# Patient Record
Sex: Male | Born: 1950
Health system: Southern US, Community
[De-identification: ages and names within clinical notes are randomized; demographics above are authoritative.]

## PROBLEM LIST (undated history)

## (undated) DIAGNOSIS — E119 Type 2 diabetes mellitus without complications: Secondary | ICD-10-CM

## (undated) DIAGNOSIS — K219 Gastro-esophageal reflux disease without esophagitis: Secondary | ICD-10-CM

## (undated) DIAGNOSIS — E785 Hyperlipidemia, unspecified: Secondary | ICD-10-CM

## (undated) DIAGNOSIS — T7840XA Allergy, unspecified, initial encounter: Secondary | ICD-10-CM

## (undated) DIAGNOSIS — M1990 Unspecified osteoarthritis, unspecified site: Secondary | ICD-10-CM

## (undated) DIAGNOSIS — K5790 Diverticulosis of intestine, part unspecified, without perforation or abscess without bleeding: Secondary | ICD-10-CM

## (undated) HISTORY — DX: Gastro-esophageal reflux disease without esophagitis: K21.9

## (undated) HISTORY — DX: Unspecified osteoarthritis, unspecified site: M19.90

## (undated) HISTORY — PX: VASECTOMY: SHX75

## (undated) HISTORY — DX: Hyperlipidemia, unspecified: E78.5

## (undated) HISTORY — DX: Diverticulosis of intestine, part unspecified, without perforation or abscess without bleeding: K57.90

## (undated) HISTORY — DX: Allergy, unspecified, initial encounter: T78.40XA

---

## 2006-12-14 ENCOUNTER — Ambulatory Visit: Payer: Self-pay | Admitting: General Practice

## 2007-04-07 ENCOUNTER — Ambulatory Visit: Payer: Self-pay | Admitting: General Practice

## 2009-05-21 ENCOUNTER — Ambulatory Visit: Payer: Self-pay | Admitting: Gastroenterology

## 2011-08-18 ENCOUNTER — Ambulatory Visit: Payer: Self-pay | Admitting: General Practice

## 2011-08-18 LAB — COMPREHENSIVE METABOLIC PANEL
Alkaline Phosphatase: 73 U/L (ref 50–136)
Calcium, Total: 8.7 mg/dL (ref 8.5–10.1)
Co2: 27 mmol/L (ref 21–32)
EGFR (Non-African Amer.): >60
Osmolality: 287 (ref 275–301)
SGOT(AST): 17 U/L (ref 15–37)
SGPT (ALT): 38 U/L
Total Protein: 7.7 g/dL (ref 6.4–8.2)

## 2011-08-18 LAB — CBC WITH DIFFERENTIAL/PLATELET
Basophil %: 0.5 %
Eosinophil #: 0.2 10*3/uL (ref 0.0–0.7)
HCT: 43.7 % (ref 40.0–52.0)
HGB: 14.8 g/dL (ref 13.0–18.0)
Lymphocyte %: 15.8 %
Monocyte %: 7.9 %
Neutrophil #: 8.7 10*3/uL — ABNORMAL HIGH (ref 1.4–6.5)
RBC: 4.76 10*6/uL (ref 4.40–5.90)

## 2011-08-18 LAB — LIPID PANEL
Cholesterol: 183 mg/dL (ref 0–200)
HDL Cholesterol: 42 mg/dL (ref 40–60)
Ldl Cholesterol, Calc: 113 mg/dL — ABNORMAL HIGH (ref 0–100)
Triglycerides: 139 mg/dL (ref 0–200)
VLDL Cholesterol, Calc: 28 mg/dL (ref 5–40)

## 2011-08-18 LAB — LIPASE, BLOOD: Lipase: 155 U/L (ref 73–393)

## 2012-02-19 ENCOUNTER — Ambulatory Visit: Payer: Self-pay | Admitting: General Practice

## 2013-08-19 ENCOUNTER — Ambulatory Visit: Payer: Self-pay | Admitting: General Practice

## 2015-01-17 HISTORY — PX: BACK SURGERY: SHX140

## 2015-02-04 IMAGING — CT CT CHEST W/O CM
2 of 3 series · 15 of 36 positions shown, 18 images · non-contrast
Comparison: Chest CT 02/19/2012, CT abdomen pelvis 08/18/2011

CLINICAL DATA: Follow Up Middle Lobe Nodule

EXAM:
CT CHEST WITHOUT CONTRAST
TECHNIQUE: Multidetector CT imaging of the chest was performed following the
standard protocol without IV contrast..

[Series 2: routine chest wo · axial · 0.80mm/px · z∈[-678,-408]mm · 12 of 64 slices shown, 15 images]
[im 5/64  mediastinal]
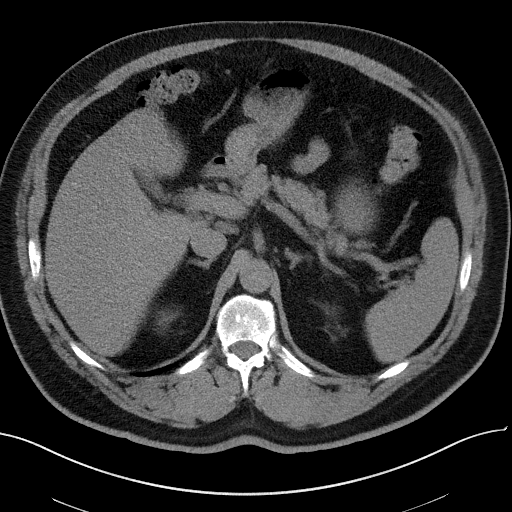
[im 5/64  lung]
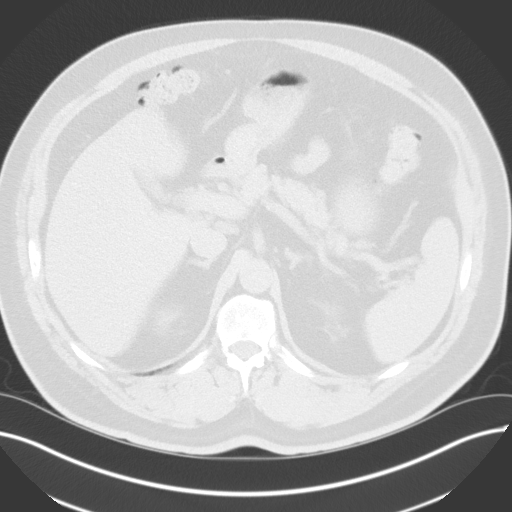
[im 10/64  lung]
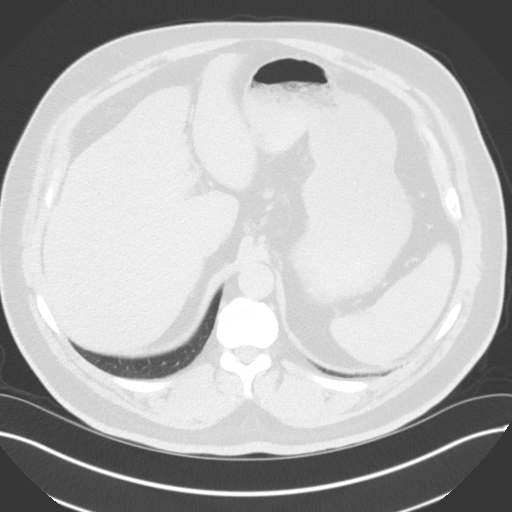
[im 15/64  lung]
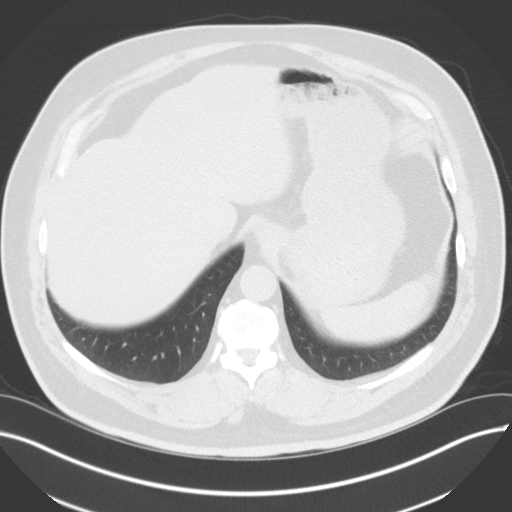
[im 19/64  lung]
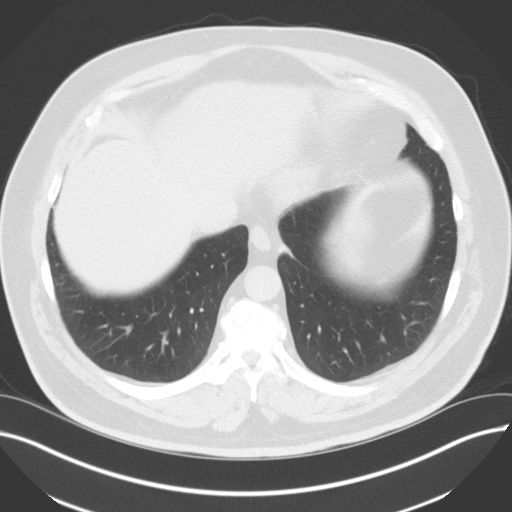
[im 24/64  mediastinal]
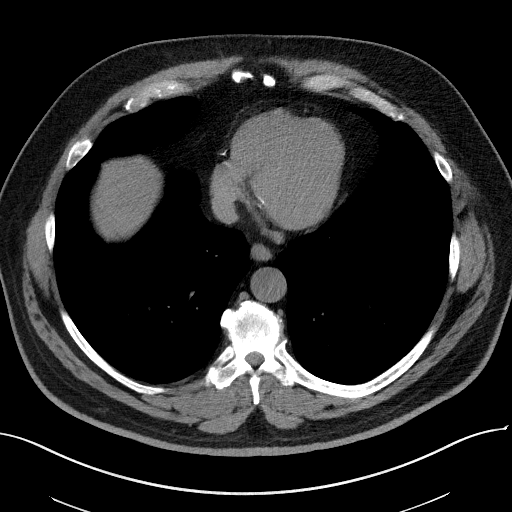
[im 24/64  lung]
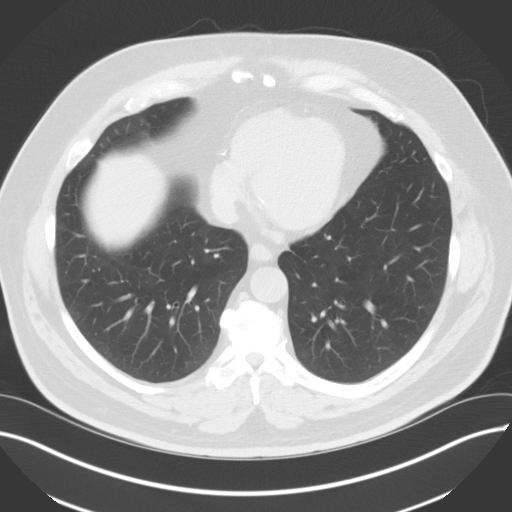
[im 29/64  lung]
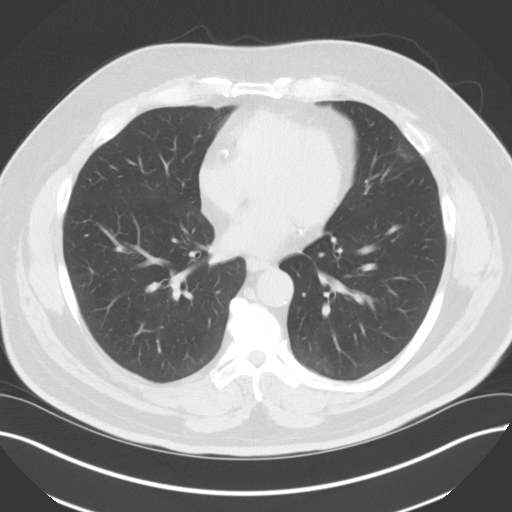
[im 36/64  lung]
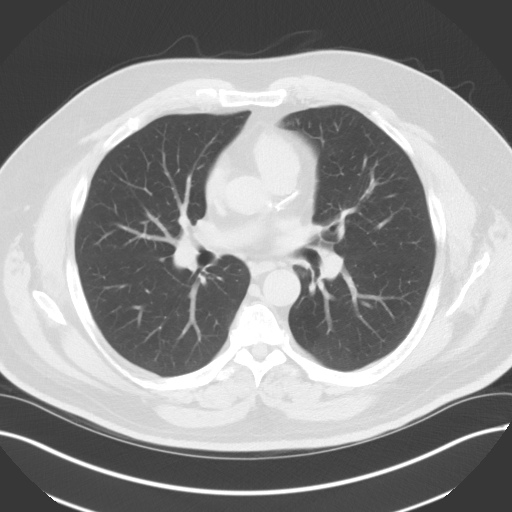
[im 40/64  lung]
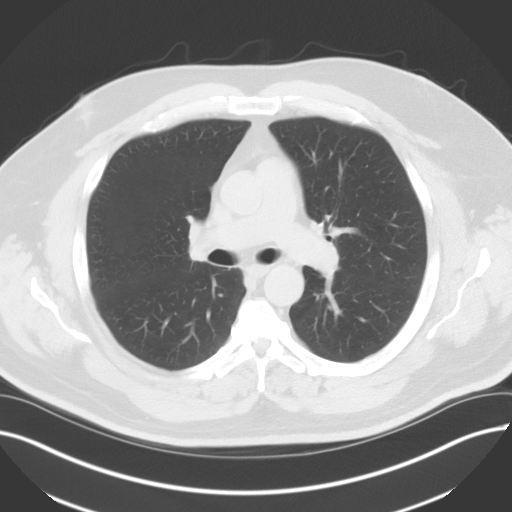
[im 45/64  mediastinal]
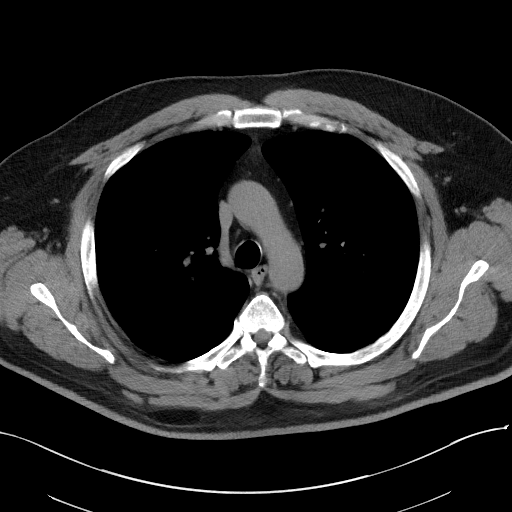
[im 45/64  lung]
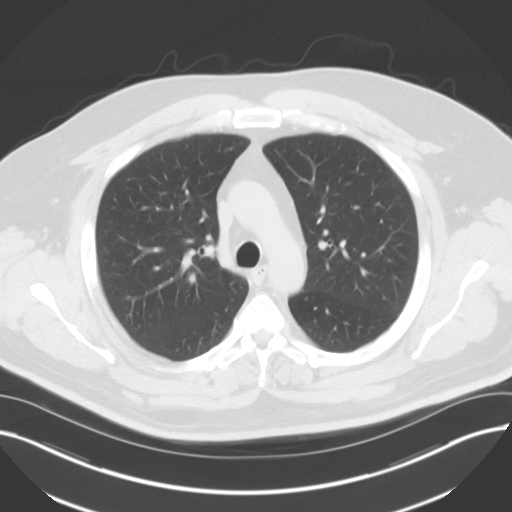
[im 50/64  lung]
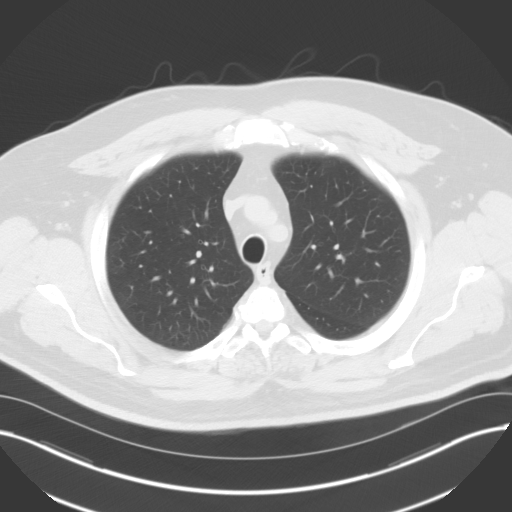
[im 54/64  lung]
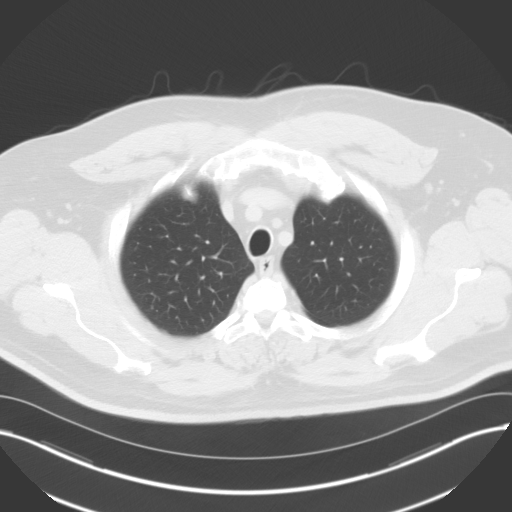
[im 59/64  lung]
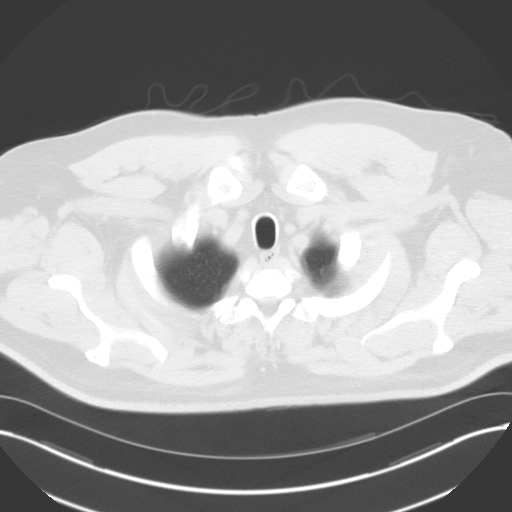

[Series 5: cor routine chest wo · coronal · 0.64mm/px · 3 of 185 slices shown]
[im 37/185  lung]
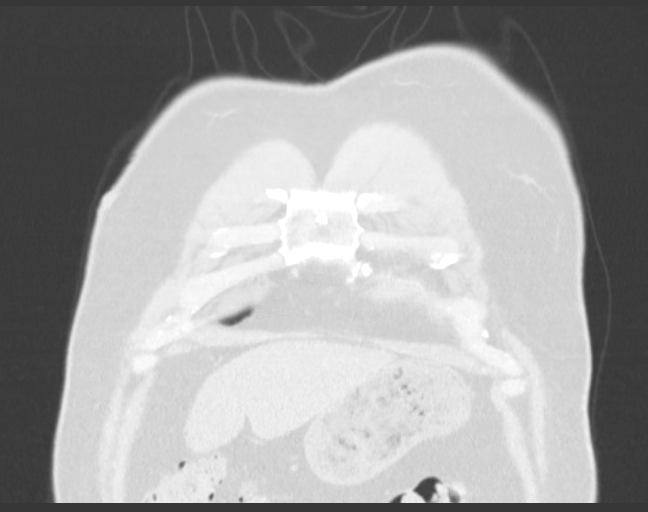
[im 74/185  lung]
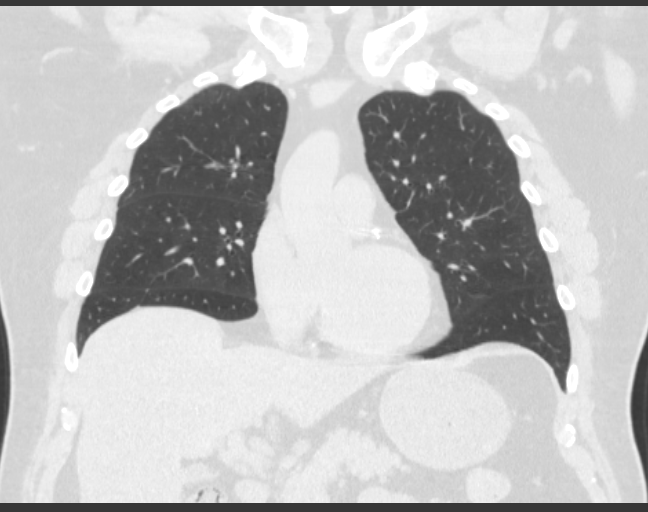
[im 111/185  lung]
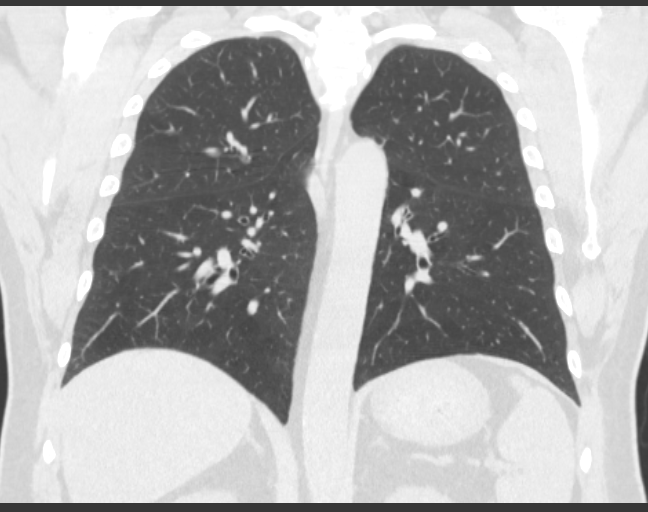

[15 of 36 positions shown; findings below may reference images not displayed]

FINDINGS: Noncontrast evaluation of the thoracic inlets unremarkable.

No mediastinal or hilar adenopathy nor masses. Atherosclerotic
calcifications within the coronary vessels. No thoracic aortic
aneurysm, thoracic aorta contains mild atherosclerotic
calcifications.

Stable 5 mm pulmonary nodule right middle lobe. The lungs otherwise
clear.

The central airways are patent.

Noncontrast evaluation visualized upper abdominal viscera
unremarkable.

No aggressive appearing osseous lesions. Multilevel degenerative
disc disease changes.
IMPRESSION: Stable 5 mm pulmonary nodule right middle lobe. If the patient is at
low risk for neoplastic disease (minimal or absent history of
smoking and other risk factors) no further surveillance evaluation
required considering stability of 12 months from initial study. If
the patient is a high risk patient (history of smoking and of other
risk factors) surveillance evaluation in 18-24 months is
recommended. This recommendation follows the consensus statement:
Guidelines for Management of Small Pulmonary Nodules Detected on CT
Scans: A Statement from the [HOSPITAL] as published in

## 2016-03-31 ENCOUNTER — Telehealth: Payer: Self-pay | Admitting: Family Medicine

## 2016-03-31 NOTE — Telephone Encounter (Signed)
This was a city of [place]  His wife, Blanch Media, is a patient here and he wants to become one of yours.  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    The only medicine he is on is Simvastatin.    Will you take him as a new patient, as his doctor is retiring (I think that's what he said).

## 2016-08-22 ENCOUNTER — Encounter: Payer: Self-pay | Admitting: Family Medicine

## 2016-08-22 ENCOUNTER — Ambulatory Visit (INDEPENDENT_AMBULATORY_CARE_PROVIDER_SITE_OTHER): Payer: Medicare Other | Admitting: Family Medicine

## 2016-08-22 VITALS — BP 128/76 | HR 67 | Temp 98.3°F | Resp 16 | Ht 71.0 in | Wt 250.0 lb

## 2016-08-22 DIAGNOSIS — N138 Other obstructive and reflux uropathy: Secondary | ICD-10-CM | POA: Diagnosis not present

## 2016-08-22 DIAGNOSIS — R7303 Prediabetes: Secondary | ICD-10-CM | POA: Insufficient documentation

## 2016-08-22 DIAGNOSIS — M159 Polyosteoarthritis, unspecified: Secondary | ICD-10-CM | POA: Insufficient documentation

## 2016-08-22 DIAGNOSIS — J3089 Other allergic rhinitis: Secondary | ICD-10-CM | POA: Insufficient documentation

## 2016-08-22 DIAGNOSIS — E782 Mixed hyperlipidemia: Secondary | ICD-10-CM | POA: Diagnosis not present

## 2016-08-22 DIAGNOSIS — M150 Primary generalized (osteo)arthritis: Secondary | ICD-10-CM

## 2016-08-22 DIAGNOSIS — Z7689 Persons encountering health services in other specified circumstances: Secondary | ICD-10-CM

## 2016-08-22 DIAGNOSIS — K5790 Diverticulosis of intestine, part unspecified, without perforation or abscess without bleeding: Secondary | ICD-10-CM | POA: Insufficient documentation

## 2016-08-22 DIAGNOSIS — Z8669 Personal history of other diseases of the nervous system and sense organs: Secondary | ICD-10-CM

## 2016-08-22 DIAGNOSIS — M47816 Spondylosis without myelopathy or radiculopathy, lumbar region: Secondary | ICD-10-CM

## 2016-08-22 DIAGNOSIS — K5730 Diverticulosis of large intestine without perforation or abscess without bleeding: Secondary | ICD-10-CM

## 2016-08-22 DIAGNOSIS — N401 Enlarged prostate with lower urinary tract symptoms: Secondary | ICD-10-CM | POA: Diagnosis not present

## 2016-08-22 DIAGNOSIS — E785 Hyperlipidemia, unspecified: Secondary | ICD-10-CM | POA: Insufficient documentation

## 2016-08-22 NOTE — Assessment & Plan Note (Signed)
Very mild stable BPH LUTS mostly some dribbling post void, otherwise no signifiacnt problems. - AUA BPH score 6 (mild),  - Failed Flomax due to side effect, back pain - Last PSA prior stable PSA around 2.0, without abnormal - Last DRE 11/2015 reported mild enlarged - Fam history of prostate CA, father age 70s  Plan: 1. Reassurance, continue surveillance. Hold meds for now - consider other options if needed, will resume PSA screening DRE in future

## 2016-08-22 NOTE — Assessment & Plan Note (Signed)
Stable, without flare See Back Pain

## 2016-08-22 NOTE — Assessment & Plan Note (Signed)
Stable chronic problem identified on screening colonoscopy Rare flare in past. No hospitalization or GI bleeding. Controlled on diet

## 2016-08-22 NOTE — Assessment & Plan Note (Signed)
Chronic osteoarthritis multiple joints, intermittent flares, currently none. Primary joints include knees, hands, back, R shoulder. Followed by Dr Lara Mulch [facility], [facility]), Dr Amedeo Plenty [facility] Hand), Dr Harl Bowie (Spine, [facility]) Recommend continue conservative therapy Tylenol, NSAID, RICE, may need future shoulder/knee injections in future, consider knee replacement per ortho as advised if worsening.

## 2016-08-22 NOTE — Assessment & Plan Note (Signed)
Stable, controlled on Loratadine, Flonase

## 2016-08-22 NOTE — Assessment & Plan Note (Signed)
Controlled cholesterol on statin and lifestyle, by report, no available outside lab results at this time Last lipid panel 11/2015, next due 11/2016 Did not calculate ASCVD 10 yr risk score today, awaiting new lipid results Significant family history of heart disease (MI, CAD, CABG)  Plan: 1. Continue Simvastatin 20mg  daily for lipids and risk reduction - Discussion today about may need higher potency statin in future other statin or higher dose simva 2. Continue ASA 81mg  for primary ASCVD risk reduction 3. Encourage improved lifestyle - low carb/cholesterol, reduce portion size, continue improving regular exercise 4. Follow-up 6 months - review outside lipids in 11/2016, then consider future Advanced Lipids with inflammation Cardiac IQ testing

## 2016-08-22 NOTE — Assessment & Plan Note (Signed)
Well-controlled Pre-DM with A1c last 5-6 range by report, no available result today  Plan:  1. Not on any therapy currently. Never on med 2. Encourage improved lifestyle - low carb, low sugar diet, reduce portion size, continue improving regular exercise 3. Follow-up 6 months - Pre-DM A1c, await outside lab results

## 2016-08-22 NOTE — Progress Notes (Signed)
Subjective:    Patient ID: Vincent Guerra, male    DOB: 1950/11/27, 66 y.o.   MRN: [id number]  Vincent Guerra is a 66 y.o. male presenting on 08/22/2016 for [place] today with new PCP here at [facility]. Now that he is on Medicare, he can no longer follow-up with [place] physician after age 43. He will go to see them once more in October 2018 for physical.  HPI   No acute concerns today.  Seasonal Allergies: - Mostly year round has mild allergies. Improved on Loratadine and Flonase  Diverticulosis: - Identified on prior colonoscopy for screening. Has had one flare previously, not hospitalized, has since resolved, no further episodes. - No significant changes to diet, but tries to avoid seeds, nuts and other provoking factors  Osteoarthritis (multiple joints, knees, hands, shoulder, back) / Chronic Back Pain - Chronic history of arthritis wear and tear of multiple joints, old injuries and chronic labor and exercise, contributing to this. He describes concerns with specifically his low back, prior complex history with multiple DJD and herniated discs and nerve impingement, had back surgery in 2017 by Dr Harl Bowie [facility]) x 4 diskectomy, has done well since, as long as he does not overdo it. - Other joint problems include bilateral hands, R shoulder, and bilateral knees. He continues to follow with various orthopedic specialists including Dr Ronnie Derby (knee/shoulder) and Dr Amedeo Plenty (hands) has had various cortisone injections in hands, shoulder, and knees. He was advised may need knee replacement in future. He tries to delay this - Takes Tylenol and ibuprofen PRN  HYPERLIPIDEMIA: - Reports no concerns. Last lipid panel 11/2015, controlled has an upcoming physical with labs - Currently taking Simvastatin 75m, tolerating well without side effects or myalgias Lifestyle - Diet: Balanced, no specific changes, tries to limit excess carbs, sugars, sweets -  Exercise: Weights with barbells for arms, treadmill x 5 days a week 30 min per - He loses 3-4 lbs when ready at a time with exercise regimen, otherwise keeps weight stable  Pre-Diabetes: Reports prior concern of A1c of 7.0 in past only x 1 result, then lifestyle changes, lost weight 20 lbs down to 5.4 previously Meds: Never taken Currently not on ACEi / ARB Denies hypoglycemia, polyuria, visual changes, numbness or tingling.  Presumed BPH with Urinary Symptoms LUTS - Prior history of mild enlarged prostate by report from other DRE. He has no significant urinary symptoms except admits he will "dribble" at end of void, worse if does not empty all the way. No significant incontinence, not with straining or laughing or sneezing/coughing - He tried Flomax before, but it actually caused back pain so he stopped this one.  AUA BPH Symptom Score over past 1 month 1. Sensation of not emptying bladder post void - 3 2. Urinate less than 2 hour after finish last void - 0 3. Start/Stop several times during void - 0 4. Difficult to postpone urination - 0 5. Weak urinary stream - 1 6. Push or strain urination - 0 7. Nocturia - 1-2 times  Total Score: 5-6 (Mild BPH symptoms)  Health Maintenance: - [facility]21/2009 [facility] reported no polyps, good for 10 years, found diverticulosis - Prostate CA Screening: Last PSA was normal, stated around 2, had prior DRE mild enlargement but no problems, and father had history of prostate CA in age 7061s Past Medical History:  Diagnosis Date  . Diverticulosis  Past Surgical History:  Procedure Laterality Date  . BACK SURGERY  01/17/2015   Dr Harl Bowie [facility]), Multiple diskectomy and herniated disc   Social History   Social History  . Marital status: Married    Spouse name: N/A  . Number of children: N/A  . Years of education: High School   Occupational History  . 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 Smoking status: Former Smoker    Packs/day: 1.00    Types: Cigarettes    Quit date: 19  . Smokeless tobacco: Former Systems developer  . Alcohol use 14.4 oz/week    24 Cans of beer per week  . Drug use: No  . Sexual activity: Not on file   Other Topics Concern  . Not on file   Social History Narrative  . No narrative on file   Family History  Problem Relation Age of Onset  . Heart disease Mother   . Alzheimer's disease Mother   . Heart attack Mother   . Prostate cancer Father 74  . Heart disease Brother 3       CABG  . Heart disease Brother        CABG   No current outpatient prescriptions on file prior to visit.   No current facility-administered medications on file prior to visit.     Review of Systems  Constitutional: Negative for activity change, appetite change, chills, diaphoresis, fatigue, fever and unexpected weight change.  HENT: Negative for congestion, hearing loss and sinus pressure.   Eyes: Negative for visual disturbance.  Respiratory: Negative for cough, chest tightness, shortness of breath and wheezing.   Cardiovascular: Negative for chest pain, palpitations and leg swelling.  Gastrointestinal: Negative for abdominal pain, anal bleeding, blood in stool, constipation, diarrhea, nausea and vomiting.  Endocrine: Negative for cold intolerance and polyuria.  Genitourinary: Negative for decreased urine volume, difficulty urinating, dysuria, frequency, hematuria and testicular pain.  Musculoskeletal: Positive for arthralgias (minor, knees), back pain (not currently) and joint swelling (sometimes knees, intermittent). Negative for neck pain.  Skin: Negative for rash.  Allergic/Immunologic: Positive for environmental allergies.  Neurological: Negative for dizziness, weakness, light-headedness, numbness and headaches.  Hematological: Negative for adenopathy.  Psychiatric/Behavioral: Negative for behavioral problems,  dysphoric mood and sleep disturbance. The patient is not nervous/anxious.    Per HPI unless specifically indicated above     Objective:    BP 128/76   Pulse 67   Temp 98.3 F (36.8 C) (Oral)   Resp 16   Ht _0  (1.803 m)   Wt 250 lb (113.4 kg)   BMI 34.87 kg/m   Wt Readings from Last 3 Encounters:  08/22/16 250 lb (113.4 kg)    Physical Exam  Constitutional: He is oriented to person, place, and time. He appears well-developed and well-nourished. No distress.  Well-appearing, comfortable, cooperative  HENT:  Head: Normocephalic and atraumatic.  Mouth/Throat: Oropharynx is clear and moist.  Eyes: Conjunctivae are normal. Right eye exhibits no discharge. Left eye exhibits no discharge.  Neck: Normal range of motion. Neck supple. No thyromegaly present.  No carotid bruits  Cardiovascular: Normal rate, regular rhythm, normal heart sounds and intact distal pulses.   No murmur heard. Pulmonary/Chest: Effort normal and breath sounds normal. No respiratory distress. He has no wheezes. He has no rales.  Musculoskeletal: Normal range of motion. He exhibits no edema.  Bilateral knees with bulky appearance. Symmetrical. Non-tender joint lines. No effusion. +fine crepitus R>L  Back with healed surgical incision. No deformity. No significant tender. Mild paraspinal hypertonicity  Lymphadenopathy:    He has no cervical adenopathy.  Neurological: He is alert and oriented to person, place, and time.  Skin: Skin is warm and dry. No rash noted. He is not diaphoretic. No erythema.  Psychiatric: He has a normal mood and affect. His behavior is normal.  Well groomed, good eye contact, normal speech and thoughts  Nursing note and vitals reviewed.  I have personally reviewed the following lab results from 2013 - has newer results, not available today from [place], requested records.  Results for orders placed or performed in visit on 08/18/11  Comprehensive metabolic panel  Result  Value Ref Range   Glucose 133 (H) 65 - 99 mg/dL   BUN 14 7 - 18 mg/dL   Creatinine 1.01 0.60 - 1.30 mg/dL   Sodium 143 136 - 145 mmol/L   Potassium 4.0 3.5 - 5.1 mmol/L   Chloride 106 98 - 107 mmol/L   Co2 27 21 - 32 mmol/L   Calcium, Total 8.7 8.5 - 10.1 mg/dL   SGOT(AST) 17 15 - 37 Unit/L   SGPT (ALT) 38 U/L   Alkaline Phosphatase 73 50 - 136 Unit/L   Albumin 3.7 3.4 - 5.0 g/dL   Total Protein 7.7 6.4 - 8.2 g/dL   Bilirubin,Total 0.6 0.2 - 1.0 mg/dL   Osmolality 287 275 - 301   Anion Gap 10 7 - 16   EGFR (African American) >60    EGFR (Non-African Amer.) >60   Hepatic Function Panel [facility])  Result Value Ref Range   Bilirubin, Direct 0.1 0.00 - 0.20 mg/dL  TSH  Result Value Ref Range   Thyroid Stimulating Horm 1.00 uIU/mL  CBC with Differential/Platelet  Result Value Ref Range   WBC 11.8 (H) 3.8 - 10.6 x10 3/mm 3   RBC 4.76 4.40 - 5.90 x10 6/mm 3   HGB 14.8 13.0 - 18.0 g/dL   HCT 43.7 40.0 - 52.0 %   MCV 92 80 - 100 fL   MCH 31.1 26.0 - 34.0 pg   MCHC 33.9 32.0 - 36.0 g/dL   RDW 13.4 11.5 - 14.5 %   Platelet 171 150 - 440 x10 3/mm 3   Neutrophil % 73.8 %   Lymphocyte % 15.8 %   Monocyte % 7.9 %   Eosinophil % 2.0 %   Basophil % 0.5 %   Neutrophil # 8.7 (H) 1.4 - 6.5 x10 3/mm 3   Lymphocyte # 1.9 1.0 - 3.6 x10 3/mm 3   Monocyte # 0.9 0.2 - 1.0 x10 3/mm    Eosinophil # 0.2 0.0 - 0.7 x10 3/mm 3   Basophil # 0.1 0.0 - 0.1 x10 3/mm 3  Uric acid  Result Value Ref Range   Uric Acid 5.4 3.5 - 7.2 mg/dL  Lipid panel  Result Value Ref Range   Cholesterol 183 0 - 200 mg/dL   Triglycerides 139 0 - 200 mg/dL   HDL Cholesterol 42 40 - 60 mg/dL   VLDL Cholesterol, Calc 28 5 - 40 mg/dL   Ldl Cholesterol, Calc 113 (H) 0 - 100 mg/dL  Lipase, blood  Result Value Ref Range   Lipase 155 73 - 393 Unit/L  T4, free  Result Value Ref Range   Free Thyroxine 0.78 0.76 - 1.46 ng/dL      Assessment & Plan:   Problem List Items Addressed This Visit    Pre-diabetes  Well-controlled Pre-DM with A1c last 5-6 range by report, no available result today  Plan:  1. Not on any therapy currently. Never on med 2. Encourage improved lifestyle - low carb, low sugar diet, reduce portion size, continue improving regular exercise 3. Follow-up 6 months - Pre-DM A1c, await outside lab results      Osteoarthritis of multiple joints - Primary    Chronic osteoarthritis multiple joints, intermittent flares, currently none. Primary joints include knees, hands, back, R shoulder. Followed by Dr Lara Mulch [facility] Dr Amedeo Plenty [facility] Hand), Dr Harl Bowie (Spine, [facility]) Recommend continue conservative therapy Tylenol, NSAID, RICE, may need future shoulder/knee injections in future, consider knee replacement per ortho as advised if worsening.      Relevant Medications   aspirin EC 81 MG tablet   ibuprofen (ADVIL,MOTRIN) 800 MG tablet   Hyperlipidemia    Controlled cholesterol on statin and lifestyle, by report, no available outside lab results at this time Last lipid panel 11/2015, next due 11/2016 Did not calculate ASCVD 10 yr risk score today, awaiting new lipid results Significant family history of heart disease (MI, CAD, CABG)  Plan: 1. Continue Simvastatin 66m daily for lipids and risk reduction - Discussion today about may need higher potency statin in future other statin or higher dose simva 2. Continue ASA 881mfor primary ASCVD risk reduction 3. Encourage improved lifestyle - low carb/cholesterol, reduce portion size, continue improving regular exercise 4. Follow-up 6 months - review outside lipids in 11/2016, then consider future Advanced Lipids with inflammation Cardiac IQ testing      Relevant Medications   aspirin EC 81 MG tablet   simvastatin (ZOCOR) 20 MG tablet   History of sciatica    Stable, without flare See Back Pain      Environmental and seasonal allergies    Stable, controlled on Loratadine, Flonase       Diverticulosis    Stable chronic problem identified on screening colonoscopy Rare flare in past. No hospitalization or GI bleeding. Controlled on diet      Degenerative joint disease (DJD) of lumbar spine    Stable. Chronic LBP with associated history of L sciatica without current flare - In setting of known chronic LBP with DJD, prior surgery L-spine 01/2016 (diskectomy) - No red flag symptoms. Negative SLR for radiculopathy  Plan: 1. Continue with current conservative approach Tylenol, NSAID, relative rest avoid overuse, stretching, may consider PT in future, follow-up with Spine ortho as needed. Consider muscle relaxant PRN. 2. Follow-up as needed, may need imaging if flares in future      Relevant Medications   aspirin EC 81 MG tablet   ibuprofen (ADVIL,MOTRIN) 800 MG tablet   BPH with obstruction/lower urinary tract symptoms    Very mild stable BPH LUTS mostly some dribbling post void, otherwise no signifiacnt problems. - AUA BPH score 6 (mild),  - Failed Flomax due to side effect, back pain - Last PSA prior stable PSA around 2.0, without abnormal - Last DRE 11/2015 reported mild enlarged - Fam history of prostate CA, father age 2477sPlan: 1. Reassurance, continue surveillance. Hold meds for now - consider other options if needed, will resume PSA screening DRE in future       Other Visit Diagnoses    Encounter to establish care with new doctor          Meds ordered this encounter  Medications  . aspirin EC 81 MG tablet    Sig: Take 81 mg by mouth.  .Marland Kitchen  fluticasone (FLONASE) 50 MCG/ACT nasal spray    Sig: Place into the nose.  . ibuprofen (ADVIL,MOTRIN) 800 MG tablet    Sig: Take 800 mg by mouth.  . loratadine (CLARITIN) 10 MG tablet    Sig: Take 10 mg by mouth.  . Multiple Vitamin (MULTIVITAMIN) capsule    Sig: Take by mouth.  . DISCONTD: DOCOSAHEXAENOIC ACID PO    Sig: Take 1 g by mouth.  . simvastatin (ZOCOR) 20 MG tablet    Sig: Take 20 mg by mouth.  .  Omega-3 Fatty Acids (FISH OIL) 1200 MG CAPS    Sig: Take by mouth 2 (two) times daily.  . ranitidine (ZANTAC) 150 MG capsule    Sig: Take 150 mg by mouth once.      Follow up plan: Return in about 6 months (around 02/22/2017) for HLD, Pre-DM A1c, Arthritis.  Nobie Putnam, [facility] 08/22/2016, 12:02 PM

## 2016-08-22 NOTE — Assessment & Plan Note (Signed)
Stable. Chronic LBP with associated history of L sciatica without current flare - In setting of known chronic LBP with DJD, prior surgery L-spine 01/2016 (diskectomy) - No red flag symptoms. Negative SLR for radiculopathy  Plan: 1. Continue with current conservative approach Tylenol, NSAID, relative rest avoid overuse, stretching, may consider PT in future, follow-up with Spine ortho as needed. Consider muscle relaxant PRN. 2. Follow-up as needed, may need imaging if flares in future

## 2016-08-22 NOTE — Patient Instructions (Addendum)
Thank you for coming to the clinic today.  Keep up the good work.  Follow-up with your [facility] in October, request that they release your record and lab results from that visit to [place].  Please consider taking higher dose Statin, either Atorvastatin, Rosuvastatin Crestor, may do higher dose to reduce risk further.  Consider an advanced cholesterol testing - Cardiac IQ  Please schedule a Follow-up Appointment to: Return in about 6 months (around 02/22/2017) for HLD, Pre-DM A1c, Arthritis.  If you have any other questions or concerns, please feel free to call the clinic or send a message through [place]. You may also schedule an earlier appointment if necessary.  Additionally, you may be receiving a survey about your experience at our clinic within a few days to 1 week by e-mail or mail. We value your feedback.  Nobie Putnam, DO [place]

## 2016-11-11 LAB — CBC AND DIFFERENTIAL
HCT: 43 (ref 41–53)
Hemoglobin: 15 (ref 13.5–17.5)
Neutrophils Absolute: 5
PLATELETS: 192 (ref 150–399)
WBC: 7.3

## 2016-11-11 LAB — LIPID PANEL
CHOLESTEROL: 169 (ref 0–200)
HDL: 45 (ref 35–70)
LDL Cholesterol: 98
Triglycerides: 129 (ref 40–160)

## 2016-11-11 LAB — HEPATIC FUNCTION PANEL
ALK PHOS: 54 (ref 25–125)
ALT: 39 (ref 10–40)
AST: 23 (ref 14–40)
BILIRUBIN, TOTAL: 0.5

## 2016-11-11 LAB — BASIC METABOLIC PANEL
BUN: 22 — AB (ref 4–21)
CREATININE: 1 (ref 0.6–1.3)
GLUCOSE: 99
POTASSIUM: 4.6 (ref 3.4–5.3)
Sodium: 143 (ref 137–147)

## 2016-11-11 LAB — IRON,TIBC AND FERRITIN PANEL: IRON: 97

## 2016-11-11 LAB — TSH: TSH: 2.21 (ref 0.41–5.90)

## 2017-03-02 ENCOUNTER — Other Ambulatory Visit: Payer: Self-pay | Admitting: Family Medicine

## 2017-03-02 ENCOUNTER — Encounter: Payer: Self-pay | Admitting: Family Medicine

## 2017-03-02 ENCOUNTER — Ambulatory Visit (INDEPENDENT_AMBULATORY_CARE_PROVIDER_SITE_OTHER): Payer: Medicare Other | Admitting: Family Medicine

## 2017-03-02 ENCOUNTER — Ambulatory Visit: Payer: Self-pay | Admitting: Family Medicine

## 2017-03-02 VITALS — BP 130/94 | HR 68 | Temp 98.3°F | Resp 16 | Ht 71.0 in | Wt 253.0 lb

## 2017-03-02 DIAGNOSIS — M159 Polyosteoarthritis, unspecified: Secondary | ICD-10-CM

## 2017-03-02 DIAGNOSIS — R7303 Prediabetes: Secondary | ICD-10-CM | POA: Diagnosis not present

## 2017-03-02 DIAGNOSIS — M150 Primary generalized (osteo)arthritis: Secondary | ICD-10-CM

## 2017-03-02 DIAGNOSIS — M19042 Primary osteoarthritis, left hand: Secondary | ICD-10-CM | POA: Diagnosis not present

## 2017-03-02 DIAGNOSIS — M170 Bilateral primary osteoarthritis of knee: Secondary | ICD-10-CM

## 2017-03-02 DIAGNOSIS — N401 Enlarged prostate with lower urinary tract symptoms: Secondary | ICD-10-CM

## 2017-03-02 DIAGNOSIS — Z23 Encounter for immunization: Secondary | ICD-10-CM | POA: Diagnosis not present

## 2017-03-02 DIAGNOSIS — N138 Other obstructive and reflux uropathy: Secondary | ICD-10-CM

## 2017-03-02 DIAGNOSIS — R7989 Other specified abnormal findings of blood chemistry: Secondary | ICD-10-CM

## 2017-03-02 DIAGNOSIS — M19041 Primary osteoarthritis, right hand: Secondary | ICD-10-CM

## 2017-03-02 DIAGNOSIS — E782 Mixed hyperlipidemia: Secondary | ICD-10-CM

## 2017-03-02 DIAGNOSIS — Z114 Encounter for screening for human immunodeficiency virus [HIV]: Secondary | ICD-10-CM

## 2017-03-02 DIAGNOSIS — Z125 Encounter for screening for malignant neoplasm of prostate: Secondary | ICD-10-CM

## 2017-03-02 DIAGNOSIS — Z1159 Encounter for screening for other viral diseases: Secondary | ICD-10-CM

## 2017-03-02 LAB — POCT GLYCOSYLATED HEMOGLOBIN (HGB A1C): HEMOGLOBIN A1C: 5.8 — AB (ref ?–5.7)

## 2017-03-02 MED ORDER — DICLOFENAC SODIUM 1 % TD GEL: 2.0000 g | Freq: Three times a day (TID) | TRANSDERMAL | 2 refills | Status: DC | PRN

## 2017-03-02 NOTE — Patient Instructions (Addendum)
Thank you for coming to the office today.  1.  Try topical Diclofenac voltaren  Recommend to start taking Tylenol Extra Strength 500mg  tabs - take 1 to 2 tabs per dose (max 1000mg ) every 6-8 hours for pain (take regularly, don't skip a dose for next 7 days), max 24 hour daily dose is 6 tablets or 3000mg . In the future you can repeat the same everyday Tylenol course for 1-2 weeks at a time.   Recommend to follow-up back with your Orthopedic specialists for further management if not improving  2. Request record from October  3. A1c 5.8 today - still in Pre-Diabetes  4. For sinus congestion - try back on Flonase every day 2 sprays for few weeks.  If worsening, fever/chills, worsening pain pressure thicker green congestion - call office we can consider antibiotic, likely do not need right now.  We may try other nose spray as well if needed  Plenty fluids, may try Mucinex  DUE for FASTING BLOOD WORK (no food or drink after midnight before the lab appointment, only water or coffee without cream/sugar on the morning of)  SCHEDULE "Lab Only" visit in the morning at the clinic for lab draw in 6 MONTHS   - Make sure Lab Only appointment is at about 1 week before your next appointment, so that results will be available  For Lab Results, once available within 2-3 days of blood draw, you can can log in to MyChart online to view your results and a brief explanation. Also, we can discuss results at next follow-up visit.   Please schedule a Follow-up Appointment to: Return in about 6 months (around 08/30/2017) for Annual Physical.    If you have any other questions or concerns, please feel free to call the office or send a message through [place]. You may also schedule an earlier appointment if necessary.  Additionally, you may be receiving a survey about your experience at our office within a few days to 1 week by e-mail or mail. We value your feedback.  Nobie Putnam, DO [place]

## 2017-03-02 NOTE — Assessment & Plan Note (Addendum)
Stable chronic problem, OA/DJD multiple joints with flares, no active flare today Controlled on PRN Ibuprofen, concern with his dosing Do not have last lab value Cr available, done 11/2016 by prior employer [employer]: 1. Rx Diclofenac topical gel TID PRN - to avoid oral NSAID and better for hands/knees as discussed, may be able to stop Ibuprofen 2. Advised start more regular Tylenol breakthrough 1g TID PRN 3. Conservative therapy, avoid re-injury 4. Advised he should consider follow-up back with Orthopedics as needed for various joints, may consider repeat injections or other therapy - as I have limited options for him, he was not interested in gabapentin, baclofen, or tramadol

## 2017-03-02 NOTE — Assessment & Plan Note (Signed)
Well-controlled Pre-DM with A1c 5.8 today, within range from prior  Plan:  1. Not on any therapy currently. Never on med 2. Encourage improved lifestyle - low carb, low sugar diet, reduce portion size, continue improving regular exercise 3. Follow-up 6 months - annual with labs

## 2017-03-02 NOTE — Progress Notes (Signed)
Subjective:    Patient ID: Vincent Guerra, male    DOB: [birth year], 67 y.o.   MRN: [id number]  Vincent Guerra is a 67 y.o. male presenting on 03/02/2017 for Hyperlipidemia (OBTW nasal congestion clear yellowish mucus onset 4 days ear pain no chills or fever little cough with drainage)   HPI   Pre-Diabetes: Reports prior history of A1c >7 in past, was diagnosed with PreDM he had dramatic improvement down to 5.4 in past with lifestyle intervention, wt loss >20 lbs. States last checked in October 2018, does not recall reading, city has records, we have requested - Today A1c 5.8 Meds: Never taken Currently not on ACEi / ARB Lifestyle: - Diet: mostly balanced still no significant changes, tries to limit sugar / carbs - Exercise: Less regular now with winter and holidays, he has just started back on plan with walking most days, about x 5, in past was doing more upper body and weights Denies hypoglycemia, polyuria, visual changes, numbness or tingling.  Nasal / Sinus Congestion - Reports symptoms started 2-3 days ago with nasal congestion, and some sinus pressure and pain maxillary bilateral, has some pain in Left ear worse at night at times. Tried OTC Alka seltzer plus and some Ibuprofen. - Previously 2 months ago in winter he had similar problem with nasal congestion, he thought it was the heat at home, his wife had URI also - He was taking Flonase and Loratadine previously but stopped due to sinus drainage - Admits some persistent throat drainage and cough and clearing - Denies any persistent coughing or productive, fevers/chills, body aches, nausea vomiting abdominal pain, diarrhea  Osteoarthritis (multiple joints, knees, hands, shoulder, back) / Chronic Back Pain - Last visit with me 08/22/2016, for initial visit as new patient we discussed same problem, treated with continued conservative care without changes, see prior notes for background information. - Interval update with he  continues to have problem with OA/DJD multiple joints - Today patient reports no new changes but he is concerned with taking a bunch of ibuprofen, flare ups or bad days he takes Ibuprofen 800mg  up to max TID with relief. No longer taking Tylenol due to did not want to mix medicines. - In past with his Orthopedics/Spine specialists he has been on courses of other stronger medicines, but does not like the way opioids make him feel and does not want any of these class of meds - he is asking about alternative options - Interested in topical diclofenac - He is considering returning to orthopedics, has been a while, was seen by Dr Harl Bowie [facility] Dr Ronnie Derby (knee/shoulder) and Dr Amedeo Plenty (hands) - He was trying to avoid surgery or joint replacement  Last seen by his provider through [facility] in October 2018 for annual physical and occupational exam, he had labs, and EKG, reported all normal.  Health Maintenance: Due for initial pneumonia vaccine at age 67 - will receive Prevnar-13 today - next due Pneumovax-23 in 1 year in 02/2018 to complete series UTD Flu Shot 12/2016 Due routine Hepatitis C and HIV screening labs, he agrees to check despite low risk by his report, does not think these were checked before   Depression screen [facility] 2/9 03/02/2017 08/22/2016  Decreased Interest 0 0  Down, Depressed, Hopeless 0 0  PHQ - 2 Score 0 0    Social History   Tobacco Use  . Smoking status: Former Smoker    Packs/day: 1.00    Types: Cigarettes  Last attempt to quit: 1994    Years since quitting: 25.0  . Smokeless tobacco: Former Network engineer Use Topics  . Alcohol use: Yes    Alcohol/week: 14.4 oz    Types: 24 Cans of beer per week  . Drug use: No    Review of Systems Per HPI unless specifically indicated above     Objective:    BP (!) 130/94   Pulse 68   Temp 98.3 F (36.8 C) (Oral)   Resp 16   Ht 5\' 11"  (1.803 m)   Wt 253 lb (114.8 kg)   SpO2 96%   BMI 35.29 kg/m    Wt Readings from Last 3 Encounters:  03/02/17 253 lb (114.8 kg)  08/22/16 250 lb (113.4 kg)    Physical Exam  Constitutional: He is oriented to person, place, and time. He appears well-developed and well-nourished. No distress.  Well-appearing, comfortable, cooperative  HENT:  Head: Normocephalic and atraumatic.  Mouth/Throat: Oropharynx is clear and moist.  Minimal maxillary paranasal sinuses tender, frontal non tender. Nares with some deeper turbinate edema without purulence. Bilateral TMs clear without erythema, effusion or bulging. Oropharynx with some mild posterior pharyngeal derainage without erythema, exudates, edema or asymmetry.  Eyes: Conjunctivae are normal. Right eye exhibits no discharge. Left eye exhibits no discharge.  Neck: Normal range of motion. Neck supple. No thyromegaly present.  Cardiovascular: Normal rate, regular rhythm, normal heart sounds and intact distal pulses.  No murmur heard. Pulmonary/Chest: Effort normal and breath sounds normal. No respiratory distress. He has no wheezes. He has no rales.  Musculoskeletal: Normal range of motion. He exhibits no edema.  Bilateral knees with bulky appearance. Symmetrical. Non-tender joint lines. No effusion. +fine crepitus R>L - stable since last visit  Bilateral hands with bulky MCP PIP joints, mostly symmetrical, good active ROM today, grip intact, no erythema localized pain or edema.  Lymphadenopathy:    He has no cervical adenopathy.  Neurological: He is alert and oriented to person, place, and time.  Skin: Skin is warm and dry. No rash noted. He is not diaphoretic. No erythema.  Psychiatric: He has a normal mood and affect. His behavior is normal.  Well groomed, good eye contact, normal speech and thoughts  Nursing note and vitals reviewed.  Results for orders placed or performed in visit on 03/02/17  POCT HgB A1C  Result Value Ref Range   Hemoglobin A1C 5.8 (A) 5.7   Recent Labs    03/02/17 0959  HGBA1C  5.8*       Assessment & Plan:   Problem List Items Addressed This Visit    Osteoarthritis of multiple joints    Stable chronic problem, OA/DJD multiple joints with flares, no active flare today Controlled on PRN Ibuprofen, concern with his dosing Do not have last lab value Cr available, done 11/2016 by prior employer [employer]: 1. Rx Diclofenac topical gel TID PRN - to avoid oral NSAID and better for hands/knees as discussed, may be able to stop Ibuprofen 2. Advised start more regular Tylenol breakthrough 1g TID PRN 3. Conservative therapy, avoid re-injury 4. Advised he should consider follow-up back with Orthopedics as needed for various joints, may consider repeat injections or other therapy - as I have limited options for him, he was not interested in gabapentin, baclofen, or tramadol      Relevant Medications   diclofenac sodium (VOLTAREN) 1 % GEL   Pre-diabetes - Primary    Well-controlled Pre-DM with A1c 5.8 today,  within range from prior  Plan:  1. Not on any therapy currently. Never on med 2. Encourage improved lifestyle - low carb, low sugar diet, reduce portion size, continue improving regular exercise 3. Follow-up 6 months - annual with labs      Relevant Orders   POCT HgB A1C (Completed)    Other Visit Diagnoses    Primary osteoarthritis of both hands       Relevant Medications   diclofenac sodium (VOLTAREN) 1 % GEL   Primary osteoarthritis of both knees       Relevant Medications   diclofenac sodium (VOLTAREN) 1 % GEL   Need for vaccination with 13-polyvalent pneumococcal conjugate vaccine       Relevant Orders   Pneumococcal conjugate vaccine 13-valent IM (Completed)      Meds ordered this encounter  Medications  . diclofenac sodium (VOLTAREN) 1 % GEL    Sig: Apply 2 g topically 3 (three) times daily as needed. Use for hands and knee arthritis, joint pain    Dispense:  100 g    Refill:  2    Follow up plan: Return in about 6 months  (around 08/30/2017) for Annual Physical.   A total of >25 minutes was spent face-to-face with this patient. Greater than 50% of this time was spent in counseling on diagnosis, management medications and other options for osteoarthritis of different joints including injections, PT, surgical options, reviewed med dosing side effects combinations of rx and OTC.  Future fasting labs ordered for 07/2017  Nobie Putnam, [facility] 03/02/2017, 12:41 PM

## 2017-03-16 ENCOUNTER — Other Ambulatory Visit: Payer: Self-pay | Admitting: Family Medicine

## 2017-03-16 NOTE — Telephone Encounter (Signed)
Pt needs a 90 day supply refill on ibuprofen sent to [facility].

## 2017-03-17 ENCOUNTER — Telehealth: Payer: Self-pay | Admitting: Family Medicine

## 2017-03-17 DIAGNOSIS — M150 Primary generalized (osteo)arthritis: Principal | ICD-10-CM

## 2017-03-17 DIAGNOSIS — M47816 Spondylosis without myelopathy or radiculopathy, lumbar region: Secondary | ICD-10-CM

## 2017-03-17 DIAGNOSIS — M159 Polyosteoarthritis, unspecified: Secondary | ICD-10-CM

## 2017-03-17 MED ORDER — IBUPROFEN 800 MG PO TABS: 800.0000 mg | ORAL_TABLET | Freq: Every day | ORAL | 0 refills | Status: DC

## 2017-03-17 NOTE — Telephone Encounter (Signed)
I last saw patient 03/02/17 discussed arthritis.  He was discontinued on Ibuprofen.  Switiched to new rx topical Diclofenac - I do not see if this was approved or not.  Now he is requesting rx Ibuprofen, however I would prefer that he not take ibuprofen instead should take the topical cream if we can get it approved.  I signed off on ibuprofen 800mg  yesterday on his request. But before changing it to 3 times daily I would need to know if he can get the Diclofenac or not. As he should not be on both medicines.  Nobie Putnam, [facility] 03/17/2017, 6:12 PM

## 2017-03-18 MED ORDER — IBUPROFEN 800 MG PO TABS: 800.0000 mg | ORAL_TABLET | Freq: Three times a day (TID) | ORAL | 1 refills | Status: DC | PRN

## 2017-03-18 NOTE — Telephone Encounter (Signed)
Left message for patient to call back  

## 2017-03-18 NOTE — Telephone Encounter (Signed)
Patient advised as per dr. Raliegh Ip. He has to pay $45 for Diclofenac gel. Advised patient that we can do prior approval for medication but patient refused and upset and wants Rx for ibuprofen x3 daily as he work as Investment banker, operational.

## 2017-03-18 NOTE — Telephone Encounter (Signed)
Reviewed last note, called patient back he prefers to take ibuprofen on minimum dose 1-2 times daily, occasionally takes 3 times daily if more active. He agrees now to try the topical diclofenac, for less side effects, he was worried about washing hands if it would rub off, advised it should rub in and take effect, he will try it now, and agreed to send 90 day supply ibuprofen 800 to have as PRN use only in future, #270 pills with refill  Nobie Putnam, [facility] 03/18/2017, 5:43 PM

## 2017-03-18 NOTE — Addendum Note (Signed)
Addended by: Olin Hauser on: 03/18/2017 05:43 PM   Modules accepted: Orders

## 2017-03-31 ENCOUNTER — Other Ambulatory Visit: Payer: Self-pay | Admitting: Family Medicine

## 2017-03-31 DIAGNOSIS — E782 Mixed hyperlipidemia: Secondary | ICD-10-CM

## 2017-03-31 MED ORDER — SIMVASTATIN 20 MG PO TABS: 20.0000 mg | ORAL_TABLET | Freq: Every day | ORAL | 3 refills | Status: DC

## 2017-04-08 ENCOUNTER — Encounter: Payer: Self-pay | Admitting: Family Medicine

## 2017-05-13 ENCOUNTER — Other Ambulatory Visit: Payer: Self-pay | Admitting: Family Medicine

## 2017-05-13 DIAGNOSIS — N138 Other obstructive and reflux uropathy: Secondary | ICD-10-CM

## 2017-05-13 DIAGNOSIS — N401 Enlarged prostate with lower urinary tract symptoms: Principal | ICD-10-CM

## 2017-06-05 DIAGNOSIS — X32XXXA Exposure to sunlight, initial encounter: Secondary | ICD-10-CM | POA: Diagnosis not present

## 2017-06-05 DIAGNOSIS — D2271 Melanocytic nevi of right lower limb, including hip: Secondary | ICD-10-CM | POA: Diagnosis not present

## 2017-06-05 DIAGNOSIS — D2261 Melanocytic nevi of right upper limb, including shoulder: Secondary | ICD-10-CM | POA: Diagnosis not present

## 2017-06-05 DIAGNOSIS — D485 Neoplasm of uncertain behavior of skin: Secondary | ICD-10-CM | POA: Diagnosis not present

## 2017-06-05 DIAGNOSIS — L821 Other seborrheic keratosis: Secondary | ICD-10-CM | POA: Diagnosis not present

## 2017-06-05 DIAGNOSIS — D2262 Melanocytic nevi of left upper limb, including shoulder: Secondary | ICD-10-CM | POA: Diagnosis not present

## 2017-06-05 DIAGNOSIS — D2272 Melanocytic nevi of left lower limb, including hip: Secondary | ICD-10-CM | POA: Diagnosis not present

## 2017-06-05 DIAGNOSIS — D225 Melanocytic nevi of trunk: Secondary | ICD-10-CM | POA: Diagnosis not present

## 2017-06-05 DIAGNOSIS — L570 Actinic keratosis: Secondary | ICD-10-CM | POA: Diagnosis not present

## 2017-07-28 ENCOUNTER — Other Ambulatory Visit: Payer: Self-pay

## 2017-07-28 DIAGNOSIS — Z1159 Encounter for screening for other viral diseases: Secondary | ICD-10-CM

## 2017-07-28 DIAGNOSIS — R7303 Prediabetes: Secondary | ICD-10-CM

## 2017-07-28 DIAGNOSIS — E782 Mixed hyperlipidemia: Secondary | ICD-10-CM

## 2017-07-28 DIAGNOSIS — R7989 Other specified abnormal findings of blood chemistry: Secondary | ICD-10-CM

## 2017-07-28 DIAGNOSIS — M159 Polyosteoarthritis, unspecified: Secondary | ICD-10-CM

## 2017-07-28 DIAGNOSIS — Z125 Encounter for screening for malignant neoplasm of prostate: Secondary | ICD-10-CM

## 2017-07-28 DIAGNOSIS — Z114 Encounter for screening for human immunodeficiency virus [HIV]: Secondary | ICD-10-CM

## 2017-07-28 DIAGNOSIS — N138 Other obstructive and reflux uropathy: Secondary | ICD-10-CM

## 2017-07-28 DIAGNOSIS — N401 Enlarged prostate with lower urinary tract symptoms: Secondary | ICD-10-CM

## 2017-07-28 DIAGNOSIS — M150 Primary generalized (osteo)arthritis: Secondary | ICD-10-CM

## 2017-07-29 ENCOUNTER — Other Ambulatory Visit: Payer: Medicare Other

## 2017-07-29 DIAGNOSIS — Z125 Encounter for screening for malignant neoplasm of prostate: Secondary | ICD-10-CM | POA: Diagnosis not present

## 2017-07-29 DIAGNOSIS — E782 Mixed hyperlipidemia: Secondary | ICD-10-CM | POA: Diagnosis not present

## 2017-07-29 DIAGNOSIS — Z114 Encounter for screening for human immunodeficiency virus [HIV]: Secondary | ICD-10-CM | POA: Diagnosis not present

## 2017-07-29 DIAGNOSIS — R7989 Other specified abnormal findings of blood chemistry: Secondary | ICD-10-CM | POA: Diagnosis not present

## 2017-07-29 DIAGNOSIS — M150 Primary generalized (osteo)arthritis: Secondary | ICD-10-CM | POA: Diagnosis not present

## 2017-07-29 DIAGNOSIS — Z1159 Encounter for screening for other viral diseases: Secondary | ICD-10-CM | POA: Diagnosis not present

## 2017-07-29 DIAGNOSIS — R7303 Prediabetes: Secondary | ICD-10-CM | POA: Diagnosis not present

## 2017-07-29 DIAGNOSIS — N138 Other obstructive and reflux uropathy: Secondary | ICD-10-CM | POA: Diagnosis not present

## 2017-07-29 DIAGNOSIS — N401 Enlarged prostate with lower urinary tract symptoms: Secondary | ICD-10-CM | POA: Diagnosis not present

## 2017-08-03 ENCOUNTER — Ambulatory Visit (INDEPENDENT_AMBULATORY_CARE_PROVIDER_SITE_OTHER): Payer: Medicare Other | Admitting: Family Medicine

## 2017-08-03 ENCOUNTER — Encounter: Payer: Self-pay | Admitting: Family Medicine

## 2017-08-03 VITALS — BP 132/91 | HR 65 | Temp 98.7°F | Resp 16 | Ht 71.0 in | Wt 253.0 lb

## 2017-08-03 DIAGNOSIS — M47816 Spondylosis without myelopathy or radiculopathy, lumbar region: Secondary | ICD-10-CM | POA: Diagnosis not present

## 2017-08-03 DIAGNOSIS — N401 Enlarged prostate with lower urinary tract symptoms: Secondary | ICD-10-CM | POA: Diagnosis not present

## 2017-08-03 DIAGNOSIS — M150 Primary generalized (osteo)arthritis: Secondary | ICD-10-CM | POA: Diagnosis not present

## 2017-08-03 DIAGNOSIS — E782 Mixed hyperlipidemia: Secondary | ICD-10-CM | POA: Diagnosis not present

## 2017-08-03 DIAGNOSIS — N138 Other obstructive and reflux uropathy: Secondary | ICD-10-CM

## 2017-08-03 DIAGNOSIS — E669 Obesity, unspecified: Secondary | ICD-10-CM | POA: Diagnosis not present

## 2017-08-03 DIAGNOSIS — Z8669 Personal history of other diseases of the nervous system and sense organs: Secondary | ICD-10-CM | POA: Diagnosis not present

## 2017-08-03 DIAGNOSIS — R7303 Prediabetes: Secondary | ICD-10-CM | POA: Diagnosis not present

## 2017-08-03 DIAGNOSIS — M159 Polyosteoarthritis, unspecified: Secondary | ICD-10-CM

## 2017-08-03 MED ORDER — FINASTERIDE 5 MG PO TABS: 5.0000 mg | ORAL_TABLET | Freq: Every day | ORAL | 3 refills | Status: DC

## 2017-08-03 NOTE — Assessment & Plan Note (Signed)
Very mild stable BPH LUTS mostly some nocturia and dribbling post void, otherwise no signifiacnt problems. - Prior AUA BPH score 6 (mild) - not re-checked today - Failed Flomax due to side effect, back pain - last PSA 0.1 normal (on finasteride) - Fam history of prostate CA, father age 70s  Plan: 1. Continue Finasteride 5mg  daily - note lower PSA from 2 down to 0.1 on this med 2. Follow yearly PSA - if elevated >3-4 would consider concern and may need Urology in future

## 2017-08-03 NOTE — Assessment & Plan Note (Signed)
BMI elevated >35 Improved lifestyle diet exercise with stable weight in 6 months

## 2017-08-03 NOTE — Patient Instructions (Addendum)
Thank you for coming to the office today.  For back pain and sciatica 1. If this improves and you are able to manage it then continue on current course 2. If this keeps bothering you at night, and you want to try medication I would recommend a low dose muscle relaxant (Flexeril or Baclofen) OR Gabapentin nerve pill - both can cause some sedation -  Call back and let me know if/when interested and I can send rx - would use only at night or when wake up overnight 3. If worsening and problem during day or overall not improving - will say contact office for a short course of Prednisone steroid to calm this down and then I would recommend returning to your Orthopedic / Spine doctor  Future when ready for Shoulder Injection if interested  -----------------------------  All lab results are very good. No change to medicines  90 day supply on Finasteride   Please schedule a Follow-up Appointment to: Return in about 3 weeks (around 08/24/2017) for Welcome to Medicare (+EKG).  If you have any other questions or concerns, please feel free to call the office or send a message through [place]. You may also schedule an earlier appointment if necessary.  Additionally, you may be receiving a survey about your experience at our office within a few days to 1 week by e-mail or mail. We value your feedback.  Nobie Putnam, DO [place]

## 2017-08-03 NOTE — Assessment & Plan Note (Signed)
Controlled cholesterol on statin and lifestyle Last lipid panel 07/2017 Significant family history of heart disease (MI, CAD, CABG)  Plan: 1. Continue Simvastatin 20mg  daily for lipids and risk reduction 2. Continue ASA 81mg  for primary ASCVD risk reduction 3. Encourage improved lifestyle - low carb/cholesterol, reduce portion size, continue improving regular exercise 4. Follow-up yearly lipids

## 2017-08-03 NOTE — Progress Notes (Signed)
Subjective:    Patient ID: Vincent Guerra, male    DOB: 1950-07-29, 67 y.o.   MRN: [id number]  Vincent Guerra is a 67 y.o. male presenting on 08/03/2017 for Osteoarthritis and Pre-Diabetes  Here for a Yearly Checkup. He is age 11 and on Medicare Part A&B and will return for a "Welcome to Medicare Visit" in future.  HPI   Pre-Diabetes / Obesity BMI >35 Recent labs showed A1c improved to 5.4, from prior result 5.8. He has done well overall with improved lifestyle Meds:Never taken Currentlynot onACEi / ARB Lifestyle: - Weight unchanged in 5-6 months - Diet: mostly balanced still no significant changes, tries to limit sugar / carbs - Exercise: Increased regular walking and exercise Denies hypoglycemia  HYPERLIPIDEMIA: - Reports no concerns. Last lipid panel 07/2017, controlled  - Currently taking Simvastatin 20mg , tolerating well without side effects or myalgias  Osteoarthritis (multiple joints, knees, hands, shoulder, back) / Chronic Back Pain Chronic problem with OA/DJD multiple joints, previously followed by prior Orthpedics: Dr Amedeo Plenty (Hand), Dr Ronnie Derby (Knee), Dr Harl Bowie (Back). Now not regularly following with them. 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 - Since last visit we limited his oral ibuprofen and started topical diclofenac for hand OA and pain - Today he reports he has improved on topical diclofenac hands and shoulder, but has not been enough, he still uses ibuprofen orally at times, limiting to 1-2 most days and rarely 3 of the 800mg  tabs - Describes chronic problem with R hand, Right ring finger, had some swelling and chronic pain, he had cortisone injection at the city clinic, has had problems persistently Also back pain and sciatica - During day he has limited back pain unless over works it, he has some chronic L-Hip pain and has predictable  low back pain with associated with sciatica on Left side when he goes back to bed after waking up overnight to void will have sciatica for 1 hour until can get comfortable - He was trying to avoid surgery or joint replacement  Allergic Rhinitis Continues to use Flonase, no new concerns.  Health Maintenance: S/p Prevnar-13 02/2017 - now next due Pneumovax-23 in 1 year in 02/2018 to complete series  UTD routine Hep C and HIV screening  Prostate CA Screening: Prior PSA / DRE reported normal. Last PSA 0.1 (07/2017). Currently asymptomatic except some mild nocturia. Known family history of prostate CA w/ father, age 58.   Depression screen [facility] 2/9 08/03/2017 03/02/2017 08/22/2016  Decreased Interest 0 0 0  Down, Depressed, Hopeless 0 0 0  PHQ - 2 Score 0 0 0    Past Medical History:  Diagnosis Date  . Diverticulosis    Past Surgical History:  Procedure Laterality Date  . BACK SURGERY  01/17/2015   Dr Harl Bowie [facility]), Multiple diskectomy and herniated disc   Social History   Socioeconomic History  . Marital status: Married    Spouse name: Not on file  . Number of children: Not on file  . Years of education: [employer]  . Highest education level: Not on file  Occupational History  . 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 Financial resource strain: Not on file  . Food insecurity:    Worry: Not on file    Inability: Not on file  . Transportation needs:  Medical: Not on file    Non-medical: Not on file  Tobacco Use  . Smoking status: Former Smoker    Packs/day: 1.00    Types: Cigarettes    Last attempt to quit: 1994    Years since quitting: 25.4  . Smokeless tobacco: Former Network engineer and Sexual Activity  . Alcohol use: Yes    Alcohol/week: 14.4 oz    Types: 24 Cans of beer per week  . Drug use: No  . Sexual activity: Not on file  Lifestyle  . Physical activity:    Days per week: Not on file    Minutes per  session: Not on file  . Stress: Not on file  Relationships  . Social connections:    Talks on phone: Not on file    Gets together: Not on file    Attends religious service: Not on file    Active member of club or organization: Not on file    Attends meetings of clubs or organizations: Not on file    Relationship status: Not on file  . Intimate partner violence:    Fear of current or ex partner: Not on file    Emotionally abused: Not on file    Physically abused: Not on file    Forced sexual activity: Not on file  Other Topics Concern  . Not on file  Social History Narrative  . Not on file   Family History  Problem Relation Age of Onset  . Heart disease Mother   . Alzheimer's disease Mother   . Heart attack Mother   . Prostate cancer Father 17  . Heart disease Brother 79       CABG  . Heart disease Brother        CABG   Current Outpatient Medications on File Prior to Visit  Medication Sig  . aspirin EC 81 MG tablet Take 81 mg by mouth.  . diclofenac sodium (VOLTAREN) 1 % GEL Apply 2 g topically 3 (three) times daily as needed. Use for hands and knee arthritis, joint pain  . fluticasone (FLONASE) 50 MCG/ACT nasal spray Place into the nose.  . ibuprofen (ADVIL,MOTRIN) 800 MG tablet Take 1 tablet (800 mg total) by mouth every 8 (eight) hours as needed for mild pain or moderate pain (arthritis).  Marland Kitchen loratadine (CLARITIN) 10 MG tablet Take 10 mg by mouth.  . Magnesium 100 MG CAPS Take by mouth.  . Multiple Vitamin (MULTIVITAMIN) capsule Take by mouth.  . Omega-3 Fatty Acids (FISH OIL) 1200 MG CAPS Take by mouth 2 (two) times daily.  . ranitidine (ZANTAC) 150 MG capsule Take 150 mg by mouth once.  . simvastatin (ZOCOR) 20 MG tablet Take 1 tablet (20 mg total) by mouth at bedtime.   No current facility-administered medications on file prior to visit.     Review of Systems  Constitutional: Negative for activity change, appetite change, chills, diaphoresis, fatigue and fever.    HENT: Positive for sinus pressure. Negative for congestion and hearing loss.   Eyes: Negative for visual disturbance.  Respiratory: Negative for apnea, cough, choking, chest tightness, shortness of breath and wheezing.   Cardiovascular: Negative for chest pain, palpitations and leg swelling.  Gastrointestinal: Negative for abdominal pain, anal bleeding, blood in stool, constipation, diarrhea, nausea and vomiting.  Endocrine: Negative for cold intolerance.  Genitourinary: Negative for decreased urine volume, difficulty urinating, dysuria, frequency, hematuria, testicular pain and urgency.       Nocturia x 1  Musculoskeletal: Positive for  arthralgias and back pain. Negative for neck pain.  Skin: Negative for rash.  Allergic/Immunologic: Negative for environmental allergies.  Neurological: Negative for dizziness, weakness, light-headedness, numbness and headaches.  Hematological: Negative for adenopathy.  Psychiatric/Behavioral: Negative for behavioral problems, dysphoric mood and sleep disturbance. The patient is not nervous/anxious.    Per HPI unless specifically indicated above      Objective:    BP (!) 132/91   Pulse 65   Temp 98.7 F (37.1 C) (Oral)   Resp 16   Ht 5\' 11"  (1.803 m)   Wt 253 lb (114.8 kg)   BMI 35.29 kg/m   Wt Readings from Last 3 Encounters:  08/03/17 253 lb (114.8 kg)  03/02/17 253 lb (114.8 kg)  08/22/16 250 lb (113.4 kg)    Physical Exam  Constitutional: He is oriented to person, place, and time. He appears well-developed and well-nourished. No distress.  Well-appearing, comfortable, cooperative, obese  HENT:  Head: Normocephalic and atraumatic.  Mouth/Throat: Oropharynx is clear and moist.  Frontal / maxillary sinuses non-tender. Nares patent without purulence or edema. Bilateral TMs clear without erythema, effusion or bulging. Oropharynx clear without erythema, exudates, edema or asymmetry.  Eyes: Pupils are equal, round, and reactive to light.  Conjunctivae and EOM are normal. Right eye exhibits no discharge. Left eye exhibits no discharge.  Neck: Normal range of motion. Neck supple. No thyromegaly present.  Cardiovascular: Normal rate, regular rhythm, normal heart sounds and intact distal pulses.  No murmur heard. Pulmonary/Chest: Effort normal and breath sounds normal. No respiratory distress. He has no wheezes. He has no rales.  Abdominal: Soft. Bowel sounds are normal. He exhibits no distension and no mass. There is no tenderness.  Musculoskeletal: He exhibits no edema.  Upper / Lower Extremities: - Normal muscle tone, strength bilateral upper extremities 5/5, lower extremities 5/5  Right / Left Hand/Wrist Inspection: Bilateral bulky appearance fingers and DIP/PIP MCP joints, symmetrical, no edema or erythema. Palpation: Mild tender various spots ROM: full active wrist ROM flex / ext, ulnar / radial deviation Strength: 5/5 grip, thumb opposition, wrist flex/ext Neurovascular: distally intact  Lymphadenopathy:    He has no cervical adenopathy.  Neurological: He is alert and oriented to person, place, and time.  Distal sensation intact to light touch all extremities  Skin: Skin is warm and dry. No rash noted. He is not diaphoretic. No erythema.  Psychiatric: He has a normal mood and affect. His behavior is normal.  Well groomed, good eye contact, normal speech and thoughts  Nursing note and vitals reviewed.  Results for orders placed or performed in visit on 07/28/17  HIV antibody  Result Value Ref Range   HIV 1&2 Ab, 4th Generation NON-REACTIVE NON-REACTI  Hepatitis C antibody  Result Value Ref Range   Hepatitis C Ab NON-REACTIVE NON-REACTI   SIGNAL TO CUT-OFF 0.02 <1.00  PSA, Total with Reflex to PSA, Free  Result Value Ref Range   PSA, Total 0.1 < OR = 4.0 ng/mL  CBC with Differential/Platelet  Result Value Ref Range   WBC 7.0 3.8 - 10.8 Thousand/uL   RBC 4.88 4.20 - 5.80 Million/uL   Hemoglobin 15.2 13.2 - 17.1  g/dL   HCT 43.6 38.5 - 50.0 %   MCV 89.3 80.0 - 100.0 fL   MCH 31.1 27.0 - 33.0 pg   MCHC 34.9 32.0 - 36.0 g/dL   RDW 13.1 11.0 - 15.0 %   Platelets 197 140 - 400 Thousand/uL   MPV 9.5 7.5 - 12.5 fL  Neutro Abs 4,046 1,500 - 7,800 cells/uL   Lymphs Abs 1,897 850 - 3,900 cells/uL   WBC mixed population 595 200 - 950 cells/uL   Eosinophils Absolute 385 15 - 500 cells/uL   Basophils Absolute 77 0 - 200 cells/uL   Neutrophils Relative % 57.8 %   Total Lymphocyte 27.1 %   Monocytes Relative 8.5 %   Eosinophils Relative 5.5 %   Basophils Relative 1.1 %  Hemoglobin A1c  Result Value Ref Range   Hgb A1c MFr Bld 5.4 <5.7 % of total Hgb   Mean Plasma Glucose 108 (calc)   eAG (mmol/L) 6.0 (calc)  Lipid panel  Result Value Ref Range   Cholesterol 165 <200 mg/dL   HDL 43 >40 mg/dL   Triglycerides 125 <150 mg/dL   LDL Cholesterol (Calc) 99 mg/dL (calc)   Total CHOL/HDL Ratio 3.8 <5.0 (calc)   Non-HDL Cholesterol (Calc) 122 <130 mg/dL (calc)  COMPLETE METABOLIC PANEL WITH GFR  Result Value Ref Range   Glucose, Bld 114 (H) 65 - 99 mg/dL   BUN 21 7 - 25 mg/dL   Creat 1.03 0.70 - 1.25 mg/dL   GFR, Est Non African American 76 > OR = 60 mL/min/1.76m2   GFR, Est African American 88 > OR = 60 mL/min/1.3m2   BUN/Creatinine Ratio NOT APPLICABLE 6 - 22 (calc)   Sodium 141 135 - 146 mmol/L   Potassium 4.4 3.5 - 5.3 mmol/L   Chloride 109 98 - 110 mmol/L   CO2 24 20 - 32 mmol/L   Calcium 9.5 8.6 - 10.3 mg/dL   Total Protein 7.0 6.1 - 8.1 g/dL   Albumin 4.5 3.6 - 5.1 g/dL   Globulin 2.5 1.9 - 3.7 g/dL (calc)   AG Ratio 1.8 1.0 - 2.5 (calc)   Total Bilirubin 0.5 0.2 - 1.2 mg/dL   Alkaline phosphatase (APISO) 52 40 - 115 U/L   AST 19 10 - 35 U/L   ALT 29 9 - 46 U/L      Assessment & Plan:   Problem List Items Addressed This Visit    BPH with obstruction/lower urinary tract symptoms    Very mild stable BPH LUTS mostly some nocturia and dribbling post void, otherwise no signifiacnt  problems. - Prior AUA BPH score 6 (mild) - not re-checked today - Failed Flomax due to side effect, back pain - last PSA 0.1 normal (on finasteride) - Fam history of prostate CA, father age 44s  Plan: 1. Continue Finasteride 5mg  daily - note lower PSA from 2 down to 0.1 on this med 2. Follow yearly PSA - if elevated >3-4 would consider concern and may need Urology in future       Relevant Medications   finasteride (PROSCAR) 5 MG tablet   Degenerative joint disease (DJD) of lumbar spine    Recent flare up at night occasionally Otherwise currently today without flare. Chronic LBP with associated history of L sciatica - In setting of known chronic LBP with DJD, prior surgery L-spine 01/2016 (diskectomy) - No red flag symptoms. Negative SLR for radiculopathy  Plan: 1. - Offered trial of med overnight with muscle relaxant vs gabapentin - help sleep and control neuropathic pain /sciatica PRN - may need prednisone burst as well if worsening problem - Continue with current conservative approach Tylenol, NSAID, relative rest avoid overuse, stretching, may consider PT in future, follow-up with Spine ortho as needed 2. Follow-up as needed, may need imaging if flares in future  History of sciatica   Hyperlipidemia    Controlled cholesterol on statin and lifestyle Last lipid panel 07/2017 Significant family history of heart disease (MI, CAD, CABG)  Plan: 1. Continue Simvastatin 20mg  daily for lipids and risk reduction 2. Continue ASA 81mg  for primary ASCVD risk reduction 3. Encourage improved lifestyle - low carb/cholesterol, reduce portion size, continue improving regular exercise 4. Follow-up yearly lipids      Obesity (BMI 35.0-39.9 without comorbidity)    BMI elevated >35 Improved lifestyle diet exercise with stable weight in 6 months      Osteoarthritis of multiple joints    Gradual worsening chronic problem, OA/DJD multiple joints with flares Hands R>L seem chronic problem  without flare but daily problem R shoulder with some flares in past, improved on injection Controlled on PRN Ibuprofen, concern with his dosing  Plan: 1. Continue Diclofenac topical gel TID PRN - to avoid oral NSAID and better for hands/knees as discussed - Continue Ibuprofen 800mg  1-3 x daily PRN if need - last creatinine was normal 2. Advised continue regular Tylenol breakthrough 1g TID PRN 3. Conservative therapy, avoid re-injury 4. Advised he should consider follow-up back with Orthopedics as needed for various joints, may consider repeat injections or other therapy - as I have limited options for him  May return for shoulder injection subacromial joint        Pre-diabetes - Primary    Well-controlled Pre-DM with A1c 5.4 today - improved  Plan:  1. Not on any therapy currently. Never on med 2. Encourage improved lifestyle - low carb, low sugar diet, reduce portion size, continue improving regular exercise 3. Follow-up 6-12 months A1c         Meds ordered this encounter  Medications  . finasteride (PROSCAR) 5 MG tablet    Sig: Take 1 tablet (5 mg total) by mouth daily.    Dispense:  90 tablet    Refill:  3     Follow up plan: Return in about 3 weeks (around 08/24/2017) for Welcome to Medicare (+EKG).  Otherwise will plan to follow-up routine check up every 6 months, sooner if need injection  Nobie Putnam, DO [facility] 08/03/2017, 12:55 PM

## 2017-08-03 NOTE — Assessment & Plan Note (Signed)
Well-controlled Pre-DM with A1c 5.4 today - improved  Plan:  1. Not on any therapy currently. Never on med 2. Encourage improved lifestyle - low carb, low sugar diet, reduce portion size, continue improving regular exercise 3. Follow-up 6-12 months A1c

## 2017-08-03 NOTE — Assessment & Plan Note (Signed)
Gradual worsening chronic problem, OA/DJD multiple joints with flares Hands R>L seem chronic problem without flare but daily problem R shoulder with some flares in past, improved on injection Controlled on PRN Ibuprofen, concern with his dosing  Plan: 1. Continue Diclofenac topical gel TID PRN - to avoid oral NSAID and better for hands/knees as discussed - Continue Ibuprofen 800mg  1-3 x daily PRN if need - last creatinine was normal 2. Advised continue regular Tylenol breakthrough 1g TID PRN 3. Conservative therapy, avoid re-injury 4. Advised he should consider follow-up back with Orthopedics as needed for various joints, may consider repeat injections or other therapy - as I have limited options for him  May return for shoulder injection subacromial joint

## 2017-08-03 NOTE — Assessment & Plan Note (Signed)
Recent flare up at night occasionally Otherwise currently today without flare. Chronic LBP with associated history of L sciatica - In setting of known chronic LBP with DJD, prior surgery L-spine 01/2016 (diskectomy) - No red flag symptoms. Negative SLR for radiculopathy  Plan: 1. - Offered trial of med overnight with muscle relaxant vs gabapentin - help sleep and control neuropathic pain /sciatica PRN - may need prednisone burst as well if worsening problem - Continue with current conservative approach Tylenol, NSAID, relative rest avoid overuse, stretching, may consider PT in future, follow-up with Spine ortho as needed 2. Follow-up as needed, may need imaging if flares in future

## 2017-08-19 ENCOUNTER — Ambulatory Visit (INDEPENDENT_AMBULATORY_CARE_PROVIDER_SITE_OTHER): Payer: Medicare Other | Admitting: Family Medicine

## 2017-08-19 ENCOUNTER — Encounter: Payer: Self-pay | Admitting: Family Medicine

## 2017-08-19 ENCOUNTER — Ambulatory Visit: Payer: Self-pay | Admitting: Family Medicine

## 2017-08-19 VITALS — BP 145/96 | HR 82 | Temp 98.3°F | Resp 16 | Ht 71.0 in | Wt 255.6 lb

## 2017-08-19 DIAGNOSIS — M150 Primary generalized (osteo)arthritis: Secondary | ICD-10-CM

## 2017-08-19 DIAGNOSIS — M25511 Pain in right shoulder: Secondary | ICD-10-CM

## 2017-08-19 DIAGNOSIS — M159 Polyosteoarthritis, unspecified: Secondary | ICD-10-CM

## 2017-08-19 DIAGNOSIS — G8929 Other chronic pain: Secondary | ICD-10-CM

## 2017-08-19 MED ORDER — METHYLPREDNISOLONE ACETATE 40 MG/ML IJ SUSP
40.0000 mg | Freq: Once | INTRAMUSCULAR | Status: AC
  Administered 2017-08-19: 40 mg via INTRA_ARTICULAR

## 2017-08-19 MED ORDER — LIDOCAINE HCL (PF) 1 % IJ SOLN
4.0000 mL | Freq: Once | INTRAMUSCULAR | Status: AC
  Administered 2017-08-19: 4 mL

## 2017-08-19 NOTE — Patient Instructions (Addendum)
Thank you for coming to the office today.  You received a Right Shoulder Joint steroid injection today. - Lidocaine numbing medicine may ease the pain initially for a few hours until it wears off - As discussed, you may experience a "steroid flare" this evening or within 24-48 hours, anytime medicine is injected into an inflamed joint it can cause the pain to get worse temporarily - Everyone responds differently to these injections, it depends on the patient and the severity of the joint problem, it may provide anywhere from days to weeks, to months of relief. Ideal response is >6 months relief - Try to take it easy for next 1-2 days, avoid over activity and strain on joint (limit Lifting for shoulder) - Recommend the following:   - For swelling - rest, compression sleeve / ACE wrap, elevation, and ice packs as needed for first few days   - For pain in future may use heating pad or moist heat as needed  If not improving as expected over next several weeks, please follow-up sooner for re-evaluation. Or return to Ortho   Please schedule a Follow-up Appointment to: Return in about 6 days (around 08/25/2017) for keep apt welcome to medicare.  If you have any other questions or concerns, please feel free to call the office or send a message through [place]. You may also schedule an earlier appointment if necessary.  Additionally, you may be receiving a survey about your experience at our office within a few days to 1 week by e-mail or mail. We value your feedback.  Nobie Putnam, DO [place]

## 2017-08-19 NOTE — Progress Notes (Signed)
Subjective:    Patient ID: Vincent Guerra, male    DOB: 25-Jul-1950, 67 y.o.   MRN: [id number]  Vincent Guerra is a 67 y.o. male presenting on 08/19/2017 for Shoulder Pain (Right)   HPI   FOLLOW-UP Osteoarthritis Right Shoulder / History of other joint arthritis - Last visit with me 08/03/17, for annual physical and discussed same problem Arthritis including R shoulder, treated with continued diclofenac topical on shoulder and rarely ibuprofen NSAID PRN, see prior notes for background information. - Today patient reports persistent problem now he is ready for shoulder injection - Previous R shoulder steroid injection about 1.5 years ago, this was 3rd injection, he has had good results with them before lasting >1 year each. He also has had prior x 2 injection in Left shoulder as well. He prefers to avoid surgery has Orthopedic doctors that followed with him in past - He admits chronic gradual worsening R > L shoulder pain, worse with extending arm out to side, has decent mobility otherwise with range of motion. - He was trying to avoid surgery or joint replacement Denies fall injury redness swelling, fever chills   Depression screen [facility] 2/9 08/19/2017 08/03/2017 03/02/2017  Decreased Interest 0 0 0  Down, Depressed, Hopeless 0 0 0  PHQ - 2 Score 0 0 0    Social History   Tobacco Use  . Smoking status: Former Smoker    Packs/day: 1.00    Types: Cigarettes    Last attempt to quit: 1994    Years since quitting: 25.5  . Smokeless tobacco: Former Network engineer Use Topics  . Alcohol use: Yes    Alcohol/week: 14.4 oz    Types: 24 Cans of beer per week  . Drug use: No    Review of Systems Per HPI unless specifically indicated above     Objective:    BP (!) 145/96   Pulse 82   Temp 98.3 F (36.8 C) (Oral)   Resp 16   Ht 5\' 11"  (1.803 m)   Wt 255 lb 9.6 oz (115.9 kg)   BMI 35.65 kg/m   Wt Readings from Last 3 Encounters:  08/19/17 255 lb 9.6 oz (115.9 kg)  08/03/17  253 lb (114.8 kg)  03/02/17 253 lb (114.8 kg)    Physical Exam  Constitutional: He is oriented to person, place, and time. He appears well-developed and well-nourished. No distress.  Well-appearing, comfortable, cooperative  HENT:  Head: Normocephalic and atraumatic.  Mouth/Throat: Oropharynx is clear and moist.  Eyes: Conjunctivae are normal. Right eye exhibits no discharge. Left eye exhibits no discharge.  Cardiovascular: Normal rate.  Pulmonary/Chest: Effort normal.  Musculoskeletal: He exhibits no edema.  Right Shoulder Inspection: Normal appearance bilateral symmetrical Palpation: Mild tender to palpation over superior lateral aspect R shoulder  ROM: Mostly full intact active ROM forward flexion, abduction, internal / external rotation, symmetrical - Provoked pain with reaching out abduction Special Testing: Rotator cuff testing negative for weakness with supraspinatus full can and empty can test, Hawkin's AC impingement mild positive for pain Strength: Normal strength 5/5 flex/ext, ext rot / int rot, grip, rotator cuff str testing. Neurovascular: Distally intact pulses, sensation to light touch   Neurological: He is alert and oriented to person, place, and time.  Skin: Skin is warm and dry. No rash noted. He is not diaphoretic. No erythema.  Psychiatric: He has a normal mood and affect. His behavior is normal.  Well groomed, good eye contact, normal speech and thoughts  Nursing note and vitals reviewed.    ________________________________________________________ PROCEDURE NOTE Date: 08/19/17 Right shoulder subacromial injection Discussed benefits and risks (including pain, bleeding, infection, steroid flare). Verbal consent given by patient. Medication:  1 cc Depo-medrol 40mg  and 4 cc Lidocaine 1% without epi Time Out taken  Landmarks identified. Area cleansed with alcohol wipes.Using 21 gauge and 1, 1/2 inch needle, Right subacromial bursa space was injected (with above  listed medication) via posterior approach cold spray used for superficial anesthetic.Sterile bandage placed.Patient tolerated procedure well without bleeding or paresthesias.No complications.   Results for orders placed or performed in visit on 07/28/17  HIV antibody  Result Value Ref Range   HIV 1&2 Ab, 4th Generation NON-REACTIVE NON-REACTI  Hepatitis C antibody  Result Value Ref Range   Hepatitis C Ab NON-REACTIVE NON-REACTI   SIGNAL TO CUT-OFF 0.02 <1.00  PSA, Total with Reflex to PSA, Free  Result Value Ref Range   PSA, Total 0.1 < OR = 4.0 ng/mL  CBC with Differential/Platelet  Result Value Ref Range   WBC 7.0 3.8 - 10.8 Thousand/uL   RBC 4.88 4.20 - 5.80 Million/uL   Hemoglobin 15.2 13.2 - 17.1 g/dL   HCT 43.6 38.5 - 50.0 %   MCV 89.3 80.0 - 100.0 fL   MCH 31.1 27.0 - 33.0 pg   MCHC 34.9 32.0 - 36.0 g/dL   RDW 13.1 11.0 - 15.0 %   Platelets 197 140 - 400 Thousand/uL   MPV 9.5 7.5 - 12.5 fL   Neutro Abs 4,046 1,500 - 7,800 cells/uL   Lymphs Abs 1,897 850 - 3,900 cells/uL   WBC mixed population 595 200 - 950 cells/uL   Eosinophils Absolute 385 15 - 500 cells/uL   Basophils Absolute 77 0 - 200 cells/uL   Neutrophils Relative % 57.8 %   Total Lymphocyte 27.1 %   Monocytes Relative 8.5 %   Eosinophils Relative 5.5 %   Basophils Relative 1.1 %  Hemoglobin A1c  Result Value Ref Range   Hgb A1c MFr Bld 5.4 <5.7 % of total Hgb   Mean Plasma Glucose 108 (calc)   eAG (mmol/L) 6.0 (calc)  Lipid panel  Result Value Ref Range   Cholesterol 165 <200 mg/dL   HDL 43 >40 mg/dL   Triglycerides 125 <150 mg/dL   LDL Cholesterol (Calc) 99 mg/dL (calc)   Total CHOL/HDL Ratio 3.8 <5.0 (calc)   Non-HDL Cholesterol (Calc) 122 <130 mg/dL (calc)  COMPLETE METABOLIC PANEL WITH GFR  Result Value Ref Range   Glucose, Bld 114 (H) 65 - 99 mg/dL   BUN 21 7 - 25 mg/dL   Creat 1.03 0.70 - 1.25 mg/dL   GFR, Est Non African American 76 > OR = 60 mL/min/1.55m2   GFR, Est African American 88  > OR = 60 mL/min/1.55m2   BUN/Creatinine Ratio NOT APPLICABLE 6 - 22 (calc)   Sodium 141 135 - 146 mmol/L   Potassium 4.4 3.5 - 5.3 mmol/L   Chloride 109 98 - 110 mmol/L   CO2 24 20 - 32 mmol/L   Calcium 9.5 8.6 - 10.3 mg/dL   Total Protein 7.0 6.1 - 8.1 g/dL   Albumin 4.5 3.6 - 5.1 g/dL   Globulin 2.5 1.9 - 3.7 g/dL (calc)   AG Ratio 1.8 1.0 - 2.5 (calc)   Total Bilirubin 0.5 0.2 - 1.2 mg/dL   Alkaline phosphatase (APISO) 52 40 - 115 U/L   AST 19 10 - 35 U/L   ALT 29 9 - 46  U/L      Assessment & Plan:   Problem List Items Addressed This Visit    Osteoarthritis of multiple joints   Relevant Medications   methylPREDNISolone acetate (DEPO-MEDROL) injection 40 mg (Completed) (Start on 08/19/2017  2:15 PM)   lidocaine (PF) (XYLOCAINE) 1 % injection 4 mL (Completed) (Start on 08/19/2017  2:15 PM)    Other Visit Diagnoses    Chronic right shoulder pain    -  Primary   Relevant Medications   methylPREDNISolone acetate (DEPO-MEDROL) injection 40 mg (Completed) (Start on 08/19/2017  2:15 PM)   lidocaine (PF) (XYLOCAINE) 1 % injection 4 mL (Completed) (Start on 08/19/2017  2:15 PM)      Right shoulder pain, arthritis gradual worsening chronic problem, OA/DJD multiple joints with flares S/p prior injections shoulders R x 3 in past, and L x 2 with improvement up to >1-2 years per injection  Plan: 1. Right shoulder subacromial steroid injection performed today, see procedure note for details. - aftercare per AVS - Continue Diclofenac topical gel TID PRN - to avoid oral NSAID and better for hands/knees as discussed - Continue Ibuprofen 800mg  1-3 x daily PRN if need - last creatinine was normal 2. Advised continue regular Tylenol breakthrough 1g TID PRN 3. Conservative therapy, avoid re-injury 4. Advised he should consider follow-up back with Orthopedics as needed  May return for L shoulder if improved.  Meds ordered this encounter  Medications  . methylPREDNISolone acetate (DEPO-MEDROL)  injection 40 mg  . lidocaine (PF) (XYLOCAINE) 1 % injection 4 mL     Follow up plan: Return in about 6 days (around 08/25/2017) for keep apt welcome to medicare.  Nobie Putnam, [facility] 08/19/2017, 2:12 PM

## 2017-08-25 ENCOUNTER — Encounter: Payer: Self-pay | Admitting: Family Medicine

## 2017-08-25 ENCOUNTER — Ambulatory Visit (INDEPENDENT_AMBULATORY_CARE_PROVIDER_SITE_OTHER): Payer: Medicare Other | Admitting: Family Medicine

## 2017-08-25 VITALS — BP 138/84 | HR 63 | Temp 98.6°F | Resp 16 | Ht 71.0 in | Wt 248.0 lb

## 2017-08-25 DIAGNOSIS — Z0000 Encounter for general adult medical examination without abnormal findings: Secondary | ICD-10-CM | POA: Diagnosis not present

## 2017-08-25 NOTE — Patient Instructions (Addendum)
Thank you for coming to the office today.  Welcome to Medicare has been completed today - should be no charge.  Completed EKG - stable from last time 11/2016  Glad hear shoulder is improved  If ear pressure and reduced hearing continues notify me and we can consider ENT referral  Return for Left shoulder injection in future - let me know.  Please schedule a Follow-up Appointment to: Return in about 5 months (around 01/25/2018) for 5-6 month follow-up Shoulder Pain / Arthritis / ?Injection.  If you have any other questions or concerns, please feel free to call the office or send a message through [place]. You may also schedule an earlier appointment if necessary.  Additionally, you may be receiving a survey about your experience at our office within a few days to 1 week by e-mail or mail. We value your feedback.  Nobie Putnam, DO [place]

## 2017-08-25 NOTE — Progress Notes (Addendum)
Patient: Vincent Guerra, Male    DOB: February 18, 1950, 67 y.o.   MRN: [id number]  Visit Date: 08/25/2017  Today's Provider: Nobie Putnam, DO   Chief Complaint  Patient presents with  . Medicare Wellness    Subjective:   Vincent Guerra is a 67 y.o. male who presents today for "Welcome to Medicare Visit"  Welcome to [employer] Wellness Visit:  HPI   Agrees to have screening EKG today. Last done 11/2016 through prior wellness exam through the city of [place], they did this yearly in past for screening. He has family history of heart disease, see Fam history below.  Chart reviewed and updated. Psychosocial and medical history were reviewed. Active issues reviewed and addressed as documented below.  Additional concerns today: - Some reduced hearing in Left ear with pressure and fluid. No new concerns this is chronic recurrent problem, taking Flonase regularly. - Recently had R shoulder subacromial injection. He states about 50% improved, less pain and less tingling. Does not wake him up at night anymore, will follow-up on this in future  Review of Systems  Constitutional: Negative for activity change, appetite change, chills, diaphoresis, fatigue and fever.  HENT: Negative for congestion and hearing loss.        L ear pressure  Eyes: Negative for visual disturbance.  Respiratory: Negative for cough, chest tightness, shortness of breath and wheezing.   Cardiovascular: Negative for chest pain, palpitations and leg swelling.  Gastrointestinal: Negative for abdominal pain, constipation, diarrhea, nausea and vomiting.  Genitourinary: Negative for dysuria, frequency and hematuria.  Musculoskeletal: Positive for arthralgias (shoulders). Negative for neck pain.  Skin: Negative for rash.  Neurological: Negative for dizziness, weakness, light-headedness, numbness and headaches.  Hematological: Negative for adenopathy.  Psychiatric/Behavioral: Negative for behavioral problems,  dysphoric mood and sleep disturbance.    See above  Past Medical History:  Diagnosis Date  . Diverticulosis     Past Surgical History:  Procedure Laterality Date  . BACK SURGERY  01/17/2015   Dr Harl Bowie [facility]), Multiple diskectomy and herniated disc    Family History  Problem Relation Age of Onset  . Heart disease Mother   . Alzheimer's disease Mother   . Heart attack Mother   . Prostate cancer Father 83  . Heart disease Brother 63       CABG  . Heart disease Brother        CABG    Social History   Socioeconomic History  . Marital status: Married    Spouse name: Not on file  . Number of children: Not on file  . Years of education: [employer]  . Highest education level: Not on file  Occupational History  . 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 Financial resource strain: Not on file  . Food insecurity:    Worry: Not on file    Inability: Not on file  . Transportation needs:    Medical: Not on file    Non-medical: Not on file  Tobacco Use  . Smoking status: Former Smoker    Packs/day: 1.00    Types: Cigarettes    Last attempt to quit: 1994    Years since quitting: 25.5  . Smokeless tobacco: Former Network engineer and Sexual Activity  . Alcohol use: Yes    Alcohol/week: 14.4 oz    Types: 24 Cans of beer per week  . Drug use: No  . Sexual activity: Not on file  Lifestyle  . Physical  activity:    Days per week: Not on file    Minutes per session: Not on file  . Stress: Not on file  Relationships  . Social connections:    Talks on phone: Not on file    Gets together: Not on file    Attends religious service: Not on file    Active member of club or organization: Not on file    Attends meetings of clubs or organizations: Not on file    Relationship status: Not on file  . Intimate partner violence:    Fear of current or ex partner: Not on file    Emotionally abused: Not on file    Physically abused:  Not on file    Forced sexual activity: Not on file  Other Topics Concern  . Not on file  Social History Narrative  . Not on file    Outpatient Encounter Medications as of 08/25/2017  Medication Sig  . aspirin EC 81 MG tablet Take 81 mg by mouth.  . diclofenac sodium (VOLTAREN) 1 % GEL Apply 2 g topically 3 (three) times daily as needed. Use for hands and knee arthritis, joint pain  . finasteride (PROSCAR) 5 MG tablet Take 1 tablet (5 mg total) by mouth daily.  . fluticasone (FLONASE) 50 MCG/ACT nasal spray Place into the nose.  . ibuprofen (ADVIL,MOTRIN) 800 MG tablet Take 1 tablet (800 mg total) by mouth every 8 (eight) hours as needed for mild pain or moderate pain (arthritis).  Marland Kitchen loratadine (CLARITIN) 10 MG tablet Take 10 mg by mouth.  . Magnesium 100 MG CAPS Take by mouth.  . Multiple Vitamin (MULTIVITAMIN) capsule Take by mouth.  . Omega-3 Fatty Acids (FISH OIL) 1200 MG CAPS Take by mouth 2 (two) times daily.  . ranitidine (ZANTAC) 150 MG capsule Take 150 mg by mouth once.  . simvastatin (ZOCOR) 20 MG tablet Take 1 tablet (20 mg total) by mouth at bedtime.   No facility-administered encounter medications on file as of 08/25/2017.       Functional Status Survey: Is the patient deaf or have difficulty hearing?: Yes(very noisey atmospher) Does the patient have difficulty seeing, even when wearing glasses/contacts?: No Does the patient have difficulty concentrating, remembering, or making decisions?: No Does the patient have difficulty walking or climbing stairs?: No Does the patient have difficulty dressing or bathing?: No Does the patient have difficulty doing errands alone such as visiting a doctor's office or shopping?: No   Fall Risk Assessment Fall Risk  08/25/2017 08/19/2017 08/03/2017 03/02/2017 08/22/2016  Falls in the past year? No No No No No    Depression Screen See under rooming Depression screen [facility] 2/9 08/25/2017 08/19/2017 08/03/2017 03/02/2017 08/22/2016  Decreased  Interest 0 0 0 0 0  Down, Depressed, Hopeless 0 0 0 0 0  PHQ - 2 Score 0 0 0 0 0    Advanced Directives - Yes already has  Objective:   Vitals: BP 138/84   Pulse 63   Temp 98.6 F (37 C) (Oral)   Resp 16   Ht 5\' 11"  (1.803 m)   Wt 248 lb (112.5 kg)   BMI 34.59 kg/m  Body mass index is 34.59 kg/m.  Filed Weights   08/25/17 1008  Weight: 248 lb (112.5 kg)     Hearing Screening   125Hz  250Hz  500Hz  1000Hz  2000Hz  3000Hz  4000Hz  6000Hz  8000Hz   Right ear:   Pass Pass Pass      Left ear:   Pass Pass  Comments: On 40 dBHL  Vision Screening Comments: Patient had eye exam on 02/2017.  Physical Exam  Constitutional: He is oriented to person, place, and time. He appears well-developed and well-nourished. No distress.  Well-appearing, comfortable, cooperative  HENT:  Head: Normocephalic and atraumatic.  Mouth/Throat: Oropharynx is clear and moist.  Eyes: Conjunctivae are normal. Right eye exhibits no discharge. Left eye exhibits no discharge.  Cardiovascular: Normal rate.  Pulmonary/Chest: Effort normal.  Musculoskeletal: He exhibits no edema.  Neurological: He is alert and oriented to person, place, and time.  Skin: Skin is warm and dry. No rash noted. He is not diaphoretic. No erythema.  Psychiatric: He has a normal mood and affect. His behavior is normal.  Well groomed, good eye contact, normal speech and thoughts  Nursing note and vitals reviewed.  EKG - performed in office today  Date: 08/25/17  Rate: 61  Rhythm: normal sinus rhythm  QRS Axis: normal  Intervals: normal  ST/T Wave abnormalities: normal except mild early repolarization V2-V3  Conduction Disutrbances:none  Old EKG Reviewed: unchanged 11/2016   MMSE - Mini Mental State Exam 08/25/2017  Orientation to time 5  Orientation to Place 5  Registration 3  Attention/ Calculation 5  Recall 3  Language- name 2 objects 2  Language- repeat 1  Language- follow 3 step command 3  Language- read & follow  direction 1  Write a sentence 1  Copy design 1  Total score 30    Cognitive Testing - 6-CIT - not performed No flowsheet data found.   Assessment & Plan:     Annual Wellness Visit  Reviewed patient's Family Medical History Reviewed and updated list of patient's medical providers Assessment of cognitive impairment was done Assessed patient's functional ability Established a written schedule for health screening [place] Completed and Reviewed  Exercise Activities and Dietary recommendations Goals    . Weight (lb) < 200 lb (90.7 kg)     patient wants to loose 10 pounds in 6 months.       Immunization History  Administered Date(s) Administered  . Pneumococcal Conjugate-13 03/02/2017    Health Maintenance  Topic Date Due  . TETANUS/TDAP  08/23/2026 (Originally 09/06/1970)  . INFLUENZA VACCINE  09/10/2017  . PNA vac Low Risk Adult (2 of 2 - PPSV23) 03/02/2018  . COLONOSCOPY  02/10/2021  . Hepatitis C Screening  Completed  . HIV Screening  Completed    Discussed health benefits of physical activity, and encouraged him to engage in regular exercise appropriate for his age and condition.   No orders of the defined types were placed in this encounter.   Current Outpatient Medications:  .  aspirin EC 81 MG tablet, Take 81 mg by mouth., Disp: , Rfl:  .  diclofenac sodium (VOLTAREN) 1 % GEL, Apply 2 g topically 3 (three) times daily as needed. Use for hands and knee arthritis, joint pain, Disp: 100 g, Rfl: 2 .  finasteride (PROSCAR) 5 MG tablet, Take 1 tablet (5 mg total) by mouth daily., Disp: 90 tablet, Rfl: 3 .  fluticasone (FLONASE) 50 MCG/ACT nasal spray, Place into the nose., Disp: , Rfl:  .  ibuprofen (ADVIL,MOTRIN) 800 MG tablet, Take 1 tablet (800 mg total) by mouth every 8 (eight) hours as needed for mild pain or moderate pain (arthritis)., Disp: 270 tablet, Rfl: 1 .  loratadine (CLARITIN) 10 MG tablet, Take 10 mg by mouth., Disp: , Rfl:  .   Magnesium 100 MG CAPS, Take by mouth., Disp: ,  Rfl:  .  Multiple Vitamin (MULTIVITAMIN) capsule, Take by mouth., Disp: , Rfl:  .  Omega-3 Fatty Acids (FISH OIL) 1200 MG CAPS, Take by mouth 2 (two) times daily., Disp: , Rfl:  .  ranitidine (ZANTAC) 150 MG capsule, Take 150 mg by mouth once., Disp: , Rfl:  .  simvastatin (ZOCOR) 20 MG tablet, Take 1 tablet (20 mg total) by mouth at bedtime., Disp: 90 tablet, Rfl: 3 There are no discontinued medications.  Next Medicare Wellness Visit in 12+ months with Tyler Aas LPN nurse health advisor  He may follow-up with me within 5 months for routine visit and shoulders/arthritis consider injection or sooner. Then next yearly with labs would be in in Summer 2020.  Nobie Putnam, [facility] 08/25/2017, 11:09 AM

## 2017-09-08 ENCOUNTER — Telehealth: Payer: Self-pay | Admitting: Family Medicine

## 2017-09-08 NOTE — Telephone Encounter (Signed)
Drug store called today states that pt wanted a 90 day  Supply of finasteride 5 MG

## 2017-09-08 NOTE — Telephone Encounter (Signed)
I reviewed the chart. The last rx for Finasteride 5mg  was sent on 08/03/17 - it was already sent to [facility] as 90 day supply with 3 refills.  They may be using an old rx that they already had on file.  I called the pharmacy. They said they have this on file. Do not need anything else.  Nobie Putnam, [facility] 09/08/2017, 12:50 PM

## 2017-10-06 ENCOUNTER — Other Ambulatory Visit: Payer: Self-pay | Admitting: Family Medicine

## 2017-11-10 LAB — LIPID PANEL
CHOL/HDL RATIO: 3.8 (calc) (ref <5.0)
CHOLESTEROL: 165 mg/dL (ref <200)
HDL: 43 mg/dL (ref >40)
LDL Cholesterol (Calc): 99 mg/dL (calc)
NON-HDL CHOLESTEROL (CALC): 122 mg/dL (ref <130)
Triglycerides: 125 mg/dL (ref <150)

## 2017-11-10 LAB — HEPATITIS C ANTIBODY
Hepatitis C Ab: NONREACTIVE
SIGNAL TO CUT-OFF: 0.02 (ref <1.00)

## 2017-11-10 LAB — COMPLETE METABOLIC PANEL WITH GFR
AG RATIO: 1.8 (calc) (ref 1.0–2.5)
ALKALINE PHOSPHATASE (APISO): 52 U/L (ref 40–115)
ALT: 29 U/L (ref 9–46)
AST: 19 U/L (ref 10–35)
Albumin: 4.5 g/dL (ref 3.6–5.1)
BUN: 21 mg/dL (ref 7–25)
CO2: 24 mmol/L (ref 20–32)
Calcium: 9.5 mg/dL (ref 8.6–10.3)
Chloride: 109 mmol/L (ref 98–110)
Creat: 1.03 mg/dL (ref 0.70–1.25)
GFR, Est African American: 88 mL/min/{1.73_m2} (ref >=60)
GFR, Est Non African American: 76 mL/min/{1.73_m2} (ref >=60)
GLOBULIN: 2.5 g/dL (ref 1.9–3.7)
Glucose, Bld: 114 mg/dL — ABNORMAL HIGH (ref 65–99)
POTASSIUM: 4.4 mmol/L (ref 3.5–5.3)
SODIUM: 141 mmol/L (ref 135–146)
Total Bilirubin: 0.5 mg/dL (ref 0.2–1.2)
Total Protein: 7 g/dL (ref 6.1–8.1)

## 2017-11-10 LAB — HIV ANTIBODY (ROUTINE TESTING W REFLEX): HIV 1&2 Ab, 4th Generation: NONREACTIVE

## 2017-11-10 LAB — CBC WITH DIFFERENTIAL/PLATELET
BASOS PCT: 1.1 %
Basophils Absolute: 77 cells/uL (ref 0–200)
Eosinophils Absolute: 385 cells/uL (ref 15–500)
Eosinophils Relative: 5.5 %
HEMATOCRIT: 43.6 % (ref 38.5–50.0)
HEMOGLOBIN: 15.2 g/dL (ref 13.2–17.1)
LYMPHS ABS: 1897 {cells}/uL (ref 850–3900)
MCH: 31.1 pg (ref 27.0–33.0)
MCHC: 34.9 g/dL (ref 32.0–36.0)
MCV: 89.3 fL (ref 80.0–100.0)
MONOS PCT: 8.5 %
MPV: 9.5 fL (ref 7.5–12.5)
NEUTROS ABS: 4046 {cells}/uL (ref 1500–7800)
Neutrophils Relative %: 57.8 %
Platelets: 197 10*3/uL (ref 140–400)
RBC: 4.88 10*6/uL (ref 4.20–5.80)
RDW: 13.1 % (ref 11.0–15.0)
Total Lymphocyte: 27.1 %
WBC mixed population: 595 cells/uL (ref 200–950)
WBC: 7 10*3/uL (ref 3.8–10.8)

## 2017-11-10 LAB — HEMOGLOBIN A1C
Hgb A1c MFr Bld: 5.4 % of total Hgb (ref <5.7)
Mean Plasma Glucose: 108 (calc)
eAG (mmol/L): 6 (calc)

## 2017-11-10 LAB — PSA, TOTAL WITH REFLEX TO PSA, FREE: PSA, TOTAL: 0.1 ng/mL (ref <=4.0)

## 2017-12-02 DIAGNOSIS — Z23 Encounter for immunization: Secondary | ICD-10-CM | POA: Diagnosis not present

## 2017-12-08 DIAGNOSIS — X32XXXA Exposure to sunlight, initial encounter: Secondary | ICD-10-CM | POA: Diagnosis not present

## 2017-12-08 DIAGNOSIS — D2272 Melanocytic nevi of left lower limb, including hip: Secondary | ICD-10-CM | POA: Diagnosis not present

## 2017-12-08 DIAGNOSIS — D2261 Melanocytic nevi of right upper limb, including shoulder: Secondary | ICD-10-CM | POA: Diagnosis not present

## 2017-12-08 DIAGNOSIS — D225 Melanocytic nevi of trunk: Secondary | ICD-10-CM | POA: Diagnosis not present

## 2017-12-08 DIAGNOSIS — L821 Other seborrheic keratosis: Secondary | ICD-10-CM | POA: Diagnosis not present

## 2017-12-08 DIAGNOSIS — L570 Actinic keratosis: Secondary | ICD-10-CM | POA: Diagnosis not present

## 2017-12-08 DIAGNOSIS — D485 Neoplasm of uncertain behavior of skin: Secondary | ICD-10-CM | POA: Diagnosis not present

## 2017-12-08 DIAGNOSIS — D2262 Melanocytic nevi of left upper limb, including shoulder: Secondary | ICD-10-CM | POA: Diagnosis not present

## 2017-12-08 DIAGNOSIS — C44319 Basal cell carcinoma of skin of other parts of face: Secondary | ICD-10-CM | POA: Diagnosis not present

## 2017-12-08 DIAGNOSIS — D2271 Melanocytic nevi of right lower limb, including hip: Secondary | ICD-10-CM | POA: Diagnosis not present

## 2018-01-14 DIAGNOSIS — C44319 Basal cell carcinoma of skin of other parts of face: Secondary | ICD-10-CM | POA: Diagnosis not present

## 2018-01-19 ENCOUNTER — Other Ambulatory Visit: Payer: Self-pay | Admitting: Family Medicine

## 2018-02-16 ENCOUNTER — Other Ambulatory Visit: Payer: Self-pay | Admitting: Family Medicine

## 2018-03-11 ENCOUNTER — Encounter: Payer: Self-pay | Admitting: Family Medicine

## 2018-03-11 ENCOUNTER — Ambulatory Visit (INDEPENDENT_AMBULATORY_CARE_PROVIDER_SITE_OTHER): Payer: Medicare Other | Admitting: Family Medicine

## 2018-03-11 VITALS — BP 128/80 | HR 71 | Temp 98.2°F | Ht 70.0 in | Wt 254.0 lb

## 2018-03-11 DIAGNOSIS — Z7289 Other problems related to lifestyle: Secondary | ICD-10-CM | POA: Diagnosis not present

## 2018-03-11 DIAGNOSIS — J309 Allergic rhinitis, unspecified: Secondary | ICD-10-CM | POA: Insufficient documentation

## 2018-03-11 DIAGNOSIS — M150 Primary generalized (osteo)arthritis: Secondary | ICD-10-CM

## 2018-03-11 DIAGNOSIS — M47816 Spondylosis without myelopathy or radiculopathy, lumbar region: Secondary | ICD-10-CM

## 2018-03-11 DIAGNOSIS — F1090 Alcohol use, unspecified, uncomplicated: Secondary | ICD-10-CM | POA: Insufficient documentation

## 2018-03-11 DIAGNOSIS — Z5181 Encounter for therapeutic drug level monitoring: Secondary | ICD-10-CM

## 2018-03-11 DIAGNOSIS — M159 Polyosteoarthritis, unspecified: Secondary | ICD-10-CM

## 2018-03-11 DIAGNOSIS — M2550 Pain in unspecified joint: Secondary | ICD-10-CM | POA: Diagnosis not present

## 2018-03-11 DIAGNOSIS — R7303 Prediabetes: Secondary | ICD-10-CM

## 2018-03-11 DIAGNOSIS — E669 Obesity, unspecified: Secondary | ICD-10-CM | POA: Diagnosis not present

## 2018-03-11 DIAGNOSIS — E782 Mixed hyperlipidemia: Secondary | ICD-10-CM

## 2018-03-11 DIAGNOSIS — J3089 Other allergic rhinitis: Secondary | ICD-10-CM | POA: Diagnosis not present

## 2018-03-11 DIAGNOSIS — Z789 Other specified health status: Secondary | ICD-10-CM | POA: Insufficient documentation

## 2018-03-11 MED ORDER — FAMOTIDINE 20 MG PO TABS: 20.0000 mg | ORAL_TABLET | Freq: Two times a day (BID) | ORAL | 5 refills | Status: DC | PRN

## 2018-03-11 NOTE — Assessment & Plan Note (Signed)
Continue nasal spray and antihistamine

## 2018-03-11 NOTE — Progress Notes (Signed)
BP 128/80   Pulse 71   Temp 98.2 F (36.8 C)   Ht 5\' 10"  (1.778 m)   Wt 254 lb (115.2 kg)   SpO2 98%   BMI 36.45 kg/m    Subjective:    Patient ID: Vincent Guerra, male    DOB: March 22, 1950, 68 y.o.   MRN: [id number]  HPI: Vincent Guerra is a 68 y.o. male  Chief Complaint  Patient presents with  . New Patient (Initial Visit)    HPI Here to establish care Arthritis in the hands; hands are wore out; arthritis in both knees and back; no B/B dysfunction, already had surgery, Dr. Harl Bowie at [facility] Right shoulder is hurting, but not sure what that is Hands are the problem; ibuprofen works the best, taking the 800 mg just twice a day most days, on the bad days he is taking three; fair water drinker, better in the summer; no hx of kidney disease; no fam hx of dialysis or renal disease; he tried to come off of the ibuprofen and use the topical and didn't work; worked hard Drinking 5-6 alcoholic drinks a day Walking on treadmill daily; has some left hip pain too Was taking H2 blocker to prevent stomach upset on the NSAID High cholesterol, about five years; eats red meat once a week maybe, not much cheese; eats about 10-12 eggs a week; no problems with simvastatin  The 10-year ASCVD risk score Mikey Bussing DC Jr., et al., 2013) is: 13.1%   Values used to calculate the score:     Age: 53 years     Sex: Male     Is Non-Hispanic African American: No     Diabetic: No     Tobacco smoker: No     Systolic Blood Pressure: 629 mmHg     Is BP treated: No     HDL Cholesterol: 43 mg/dL     Total Cholesterol: 165 mg/dL   Allergies; taking claritin and flonase; year-round allergies Hx of prediabetes; lost 23 pounds and came down to normal  Lab Results  Component Value Date   HGBA1C 5.4 07/29/2017   Obesity; used to weigh 260 range, and now 254 pounds (244 pounds at home)  Depression screen [facility] 2/9 03/11/2018 08/25/2017 08/19/2017 08/03/2017 03/02/2017  Decreased Interest 0 0 0 0 0    Down, Depressed, Hopeless 0 0 0 0 0  PHQ - 2 Score 0 0 0 0 0  Altered sleeping 0 - - - -  Tired, decreased energy 0 - - - -  Change in appetite 0 - - - -  Feeling bad or failure about yourself  0 - - - -  Trouble concentrating 0 - - - -  Moving slowly or fidgety/restless 0 - - - -  Suicidal thoughts 0 - - - -  PHQ-9 Score 0 - - - -  Difficult doing work/chores Not difficult at all - - - -   Fall Risk  03/11/2018 08/25/2017 08/19/2017 08/03/2017 03/02/2017  Falls in the past year? 0 No No No No    Relevant past medical, surgical, family and social history reviewed Past Medical History:  Diagnosis Date  . Diverticulosis    Past Surgical History:  Procedure Laterality Date  . BACK SURGERY  01/17/2015   Dr Harl Bowie [facility]), Multiple diskectomy and herniated disc   Family History  Problem Relation Age of Onset  . Heart disease Mother   . Alzheimer's disease Mother   . Heart attack Mother   .  Prostate cancer Father 67  . Heart disease Brother 67       CABG  . Heart disease Brother        CABG   Social History   Tobacco Use  . Smoking status: Former Smoker    Packs/day: 1.00    Types: Cigarettes    Last attempt to quit: 1994    Years since quitting: 26.0  . Smokeless tobacco: Former Network engineer Use Topics  . Alcohol use: Yes    Alcohol/week: 24.0 standard drinks    Types: 24 Cans of beer per week  . Drug use: No     Office Visit from 03/11/2018 in [facility]  AUDIT-C Score  10      Interim medical history since last visit reviewed. Allergies and medications reviewed  Review of Systems Per HPI unless specifically indicated above     Objective:    BP 128/80   Pulse 71   Temp 98.2 F (36.8 C)   Ht 5\' 10"  (1.778 m)   Wt 254 lb (115.2 kg)   SpO2 98%   BMI 36.45 kg/m   Wt Readings from Last 3 Encounters:  03/11/18 254 lb (115.2 kg)  08/25/17 248 lb (112.5 kg)  08/19/17 255 lb 9.6 oz (115.9 kg)    Physical  Exam Constitutional:      General: He is not in acute distress.    Appearance: He is well-developed. He is obese.  HENT:     Head: Normocephalic and atraumatic.  Eyes:     General: No scleral icterus. Neck:     Thyroid: No thyromegaly.  Cardiovascular:     Rate and Rhythm: Normal rate and regular rhythm.  Pulmonary:     Effort: Pulmonary effort is normal.     Breath sounds: Normal breath sounds.  Abdominal:     General: Bowel sounds are normal. There is no distension.     Palpations: Abdomen is soft.  Musculoskeletal:     Right hand: He exhibits decreased range of motion and deformity.     Left hand: He exhibits decreased range of motion and deformity.     Comments: Swelling of the DIPs and PIPs; tenderness over the MCP  Skin:    General: Skin is warm and dry.     Coloration: Skin is not pale.  Neurological:     Mental Status: He is alert.     Coordination: Coordination normal.  Psychiatric:        Behavior: Behavior normal.        Thought Content: Thought content normal.        Judgment: Judgment normal.     Results for orders placed or performed in visit on 07/28/17  HIV antibody  Result Value Ref Range   HIV 1&2 Ab, 4th Generation NON-REACTIVE NON-REACTI  Hepatitis C antibody  Result Value Ref Range   Hepatitis C Ab NON-REACTIVE NON-REACTI   SIGNAL TO CUT-OFF 0.02 <1.00  PSA, Total with Reflex to PSA, Free  Result Value Ref Range   PSA, Total 0.1 < OR = 4.0 ng/mL  CBC with Differential/Platelet  Result Value Ref Range   WBC 7.0 3.8 - 10.8 Thousand/uL   RBC 4.88 4.20 - 5.80 Million/uL   Hemoglobin 15.2 13.2 - 17.1 g/dL   HCT 43.6 38.5 - 50.0 %   MCV 89.3 80.0 - 100.0 fL   MCH 31.1 27.0 - 33.0 pg   MCHC 34.9 32.0 - 36.0 g/dL   RDW 13.1 11.0 -  15.0 %   Platelets 197 140 - 400 Thousand/uL   MPV 9.5 7.5 - 12.5 fL   Neutro Abs 4,046 1,500 - 7,800 cells/uL   Lymphs Abs 1,897 850 - 3,900 cells/uL   WBC mixed population 595 200 - 950 cells/uL   Eosinophils  Absolute 385 15 - 500 cells/uL   Basophils Absolute 77 0 - 200 cells/uL   Neutrophils Relative % 57.8 %   Total Lymphocyte 27.1 %   Monocytes Relative 8.5 %   Eosinophils Relative 5.5 %   Basophils Relative 1.1 %  Hemoglobin A1c  Result Value Ref Range   Hgb A1c MFr Bld 5.4 <5.7 % of total Hgb   Mean Plasma Glucose 108 (calc)   eAG (mmol/L) 6.0 (calc)  Lipid panel  Result Value Ref Range   Cholesterol 165 <200 mg/dL   HDL 43 >40 mg/dL   Triglycerides 125 <150 mg/dL   LDL Cholesterol (Calc) 99 mg/dL (calc)   Total CHOL/HDL Ratio 3.8 <5.0 (calc)   Non-HDL Cholesterol (Calc) 122 <130 mg/dL (calc)  COMPLETE METABOLIC PANEL WITH GFR  Result Value Ref Range   Glucose, Bld 114 (H) 65 - 99 mg/dL   BUN 21 7 - 25 mg/dL   Creat 1.03 0.70 - 1.25 mg/dL   GFR, Est Non African American 76 > OR = 60 mL/min/1.10m2   GFR, Est African American 88 > OR = 60 mL/min/1.74m2   BUN/Creatinine Ratio NOT APPLICABLE 6 - 22 (calc)   Sodium 141 135 - 146 mmol/L   Potassium 4.4 3.5 - 5.3 mmol/L   Chloride 109 98 - 110 mmol/L   CO2 24 20 - 32 mmol/L   Calcium 9.5 8.6 - 10.3 mg/dL   Total Protein 7.0 6.1 - 8.1 g/dL   Albumin 4.5 3.6 - 5.1 g/dL   Globulin 2.5 1.9 - 3.7 g/dL (calc)   AG Ratio 1.8 1.0 - 2.5 (calc)   Total Bilirubin 0.5 0.2 - 1.2 mg/dL   Alkaline phosphatase (APISO) 52 40 - 115 U/L   AST 19 10 - 35 U/L   ALT 29 9 - 46 U/L      Assessment & Plan:   Problem List Items Addressed This Visit      Respiratory   Allergic rhinitis    Continue nasal spray and antihistamine        Musculoskeletal and Integument   Osteoarthritis of multiple joints - Primary    Seeing specialist; would to like ibuprofen, aware of risk to kidneys; check Cr and GFR      Degenerative joint disease (DJD) of lumbar spine    Will treat with ibuprofen; excessive alcohol so will avoid tylenol        Other   Pre-diabetes   Relevant Orders   Hemoglobin A1c   Obesity (BMI 35.0-39.9 without comorbidity)     Encouraged weight loss; significant weight loss may help knees, back, cholesterol, prediabetes      Hyperlipidemia    Check lipids, try to limit egg yolks to no more than 3 per week; limiting red meat; continue statin      Relevant Orders   Lipid panel   Lipid panel   Alcohol use    Advised limiting alcohol to no more than 14 drinks per week       Other Visit Diagnoses    Arthralgia, unspecified joint       check labs to r/o RA, but definitely has OA too   Relevant Orders   ANA,IFA RA Diag  Pnl w/rflx Tit/Patn   Medication monitoring encounter       Relevant Orders   COMPLETE METABOLIC PANEL WITH GFR   CBC       Follow up plan: Return in about 6 months (around 09/09/2018) for twenty minute follow-up with fasting labs; Medicare wellness with [employer].  An after-visit summary was printed and given to the patient at [place].  Please see the patient instructions which may contain other information and recommendations beyond what is mentioned above in the assessment and plan.  Meds ordered this encounter  Medications  . famotidine (PEPCID) 20 MG tablet    Sig: Take 1 tablet (20 mg total) by mouth 2 (two) times daily as needed for heartburn or indigestion.    Dispense:  60 tablet    Refill:  5    Orders Placed This Encounter  Procedures  . Lipid panel  . Hemoglobin A1c  . COMPLETE METABOLIC PANEL WITH GFR  . ANA,IFA RA Diag Pnl w/rflx Tit/Patn  . CBC  . Lipid panel

## 2018-03-11 NOTE — Progress Notes (Signed)
Vincent Guerra, please let the patient know that his CBC is normal; other labs pending at time of this note

## 2018-03-11 NOTE — Assessment & Plan Note (Signed)
Advised limiting alcohol to no more than 14 drinks per week

## 2018-03-11 NOTE — Assessment & Plan Note (Signed)
Check lipids, try to limit egg yolks to no more than 3 per week; limiting red meat; continue statin

## 2018-03-11 NOTE — Assessment & Plan Note (Signed)
Encouraged weight loss; significant weight loss may help knees, back, cholesterol, prediabetes

## 2018-03-11 NOTE — Assessment & Plan Note (Signed)
Will treat with ibuprofen; excessive alcohol so will avoid tylenol

## 2018-03-11 NOTE — Patient Instructions (Addendum)
I will encourage you to cut back on alcohol, limit for men is no more than 14 per week Check out the information at familydoctor.org entitled "Nutrition for Weight Loss: What You Need to Know about Fad Diets" Try to lose between 1-2 pounds per week by taking in fewer calories and burning off more calories You can succeed by limiting portions, limiting foods dense in calories and fat, becoming more active, and drinking 8 glasses of water a day (64 ounces) Don't skip meals, especially breakfast, as skipping meals may alter your metabolism Do not use over-the-counter weight loss pills or gimmicks that claim rapid weight loss A healthy BMI (or body mass index) is between 18.5 and 24.9 You can calculate your ideal BMI at the NIH website ClubMonetize.fr Try to limit saturated fats in your diet (bologna, hot dogs, barbeque, cheeseburgers, hamburgers, steak, bacon, sausage, cheese, etc.) and get more fresh fruits, vegetables, and whole grains  Obesity, Adult Obesity is the condition of having too much total body fat. Being overweight or obese means that your weight is greater than what is considered healthy for your body size. Obesity is determined by a measurement called BMI. BMI is an estimate of body fat and is calculated from height and weight. For adults, a BMI of 30 or higher is considered obese. Obesity can eventually lead to other health concerns and major illnesses, including:  Stroke.  Coronary artery disease (CAD).  Type 2 diabetes.  Some types of cancer, including cancers of the colon, breast, uterus, and gallbladder.  Osteoarthritis.  High blood pressure (hypertension).  High cholesterol.  Sleep apnea.  Gallbladder stones.  Infertility problems. What are the causes? The main cause of obesity is taking in (consuming) more calories than your body uses for energy. Other factors that contribute to this condition may  include:  Being born with genes that make you more likely to become obese.  Having a medical condition that causes obesity. These conditions include: ? Hypothyroidism. ? Polycystic ovarian syndrome (PCOS). ? Binge-eating disorder. ? Cushing syndrome.  Taking certain medicines, such as steroids, antidepressants, and seizure medicines.  Not being physically active (sedentary lifestyle).  Living where there are limited places to exercise safely or buy healthy foods.  Not getting enough sleep. What increases the risk? The following factors may increase your risk of this condition:  Having a family history of obesity.  Being a woman of African-American descent.  Being a man of Hispanic descent. What are the signs or symptoms? Having excessive body fat is the main symptom of this condition. How is this diagnosed? This condition may be diagnosed based on:  Your symptoms.  Your medical history.  A physical exam. Your health care provider may measure: ? Your BMI. If you are an adult with a BMI between 25 and less than 30, you are considered overweight. If you are an adult with a BMI of 30 or higher, you are considered obese. ? The distances around your hips and your waist (circumferences). These may be compared to each other to help diagnose your condition. ? Your skinfold thickness. Your health care provider may gently pinch a fold of your skin and measure it. How is this treated? Treatment for this condition often includes changing your lifestyle. Treatment may include some or all of the following:  Dietary changes. Work with your health care provider and a dietitian to set a weight-loss goal that is healthy and reasonable for you. Dietary changes may include eating: ? Smaller portions. A portion  size is the amount of a particular food that is healthy for you to eat at one time. This varies from person to person. ? Low-calorie or low-fat options. ? More whole grains, fruits, and  vegetables.  Regular physical activity. This may include aerobic activity (cardio) and strength training.  Medicine to help you lose weight. Your health care provider may prescribe medicine if you are unable to lose 1 pound a week after 6 weeks of eating more healthily and doing more physical activity.  Surgery. Surgical options may include gastric banding and gastric bypass. Surgery may be done if: ? Other treatments have not helped to improve your condition. ? You have a BMI of 40 or higher. ? You have life-threatening health problems related to obesity. Follow these instructions at home:  Eating and drinking   Follow recommendations from your health care provider about what you eat and drink. Your health care provider may advise you to: ? Limit fast foods, sweets, and processed snack foods. ? Choose low-fat options, such as low-fat milk instead of whole milk. ? Eat 5 or more servings of fruits or vegetables every day. ? Eat at home more often. This gives you more control over what you eat. ? Choose healthy foods when you eat out. ? Learn what a healthy portion size is. ? Keep low-fat snacks on hand. ? Avoid sugary drinks, such as soda, fruit juice, iced tea sweetened with sugar, and flavored milk. ? Eat a healthy breakfast.  Drink enough water to keep your urine clear or pale yellow.  Do not go without eating for long periods of time (do not fast) or follow a fad diet. Fasting and fad diets can be unhealthy and even dangerous. Physical Activity  Exercise regularly, as told by your health care provider. Ask your health care provider what types of exercise are safe for you and how often you should exercise.  Warm up and stretch before being active.  Cool down and stretch after being active.  Rest between periods of activity. Lifestyle  Limit the time that you spend in front of your TV, computer, or video game system.  Find ways to reward yourself that do not involve  food.  Limit alcohol intake to no more than 1 drink a day for nonpregnant women and 2 drinks a day for men. One drink equals 12 oz of beer, 5 oz of wine, or 1 oz of hard liquor. General instructions  Keep a weight loss journal to keep track of the food you eat and how much you exercise you get.  Take over-the-counter and prescription medicines only as told by your health care provider.  Take vitamins and supplements only as told by your health care provider.  Consider joining a support group. Your health care provider may be able to recommend a support group.  Keep all follow-up visits as told by your health care provider. This is important. Contact a health care provider if:  You are unable to meet your weight loss goal after 6 weeks of dietary and lifestyle changes. This information is not intended to replace advice given to you by your health care provider. Make sure you discuss any questions you have with your health care provider. Document Released: 03/06/2004 Document Revised: 07/02/2015 Document Reviewed: 11/15/2014 Elsevier Interactive Patient Education  2019 Elsevier Inc.  Preventing Unhealthy [employer], Adult Staying at a healthy weight is important to your overall health. When fat builds up in your body, you may become overweight or obese.  Being overweight or obese increases your risk of developing certain health problems, such as heart disease, diabetes, sleeping problems, joint problems, and some types of cancer. Unhealthy weight gain is often the result of making unhealthy food choices or not getting enough exercise. You can make changes to your lifestyle to prevent obesity and stay as healthy as possible. What nutrition changes can be made?   Eat only as much as your body needs. To do this: ? Pay attention to signs that you are hungry or full. Stop eating as soon as you feel full. ? If you feel hungry, try drinking water first before eating. Drink enough water so your  urine is clear or pale yellow. ? Eat smaller portions. Pay attention to portion sizes when eating out. ? Look at serving sizes on food labels. Most foods contain more than one serving per container. ? Eat the recommended number of calories for your gender and activity level. For most active people, a daily total of 2,000 calories is appropriate. If you are trying to lose weight or are not very active, you may need to eat fewer calories. Talk with your health care provider or a diet and nutrition specialist (dietitian) about how many calories you need each day.  Choose healthy foods, such as: ? Fruits and vegetables. At each meal, try to fill at least half of your plate with fruits and vegetables. ? Whole grains, such as whole-wheat bread, brown rice, and quinoa. ? Lean meats, such as chicken or fish. ? Other healthy proteins, such as beans, eggs, or tofu. ? Healthy fats, such as nuts, seeds, fatty fish, and olive oil. ? Low-fat or fat-free dairy products.  Check food labels, and avoid food and drinks that: ? Are high in calories. ? Have added sugar. ? Are high in sodium. ? Have saturated fats or trans fats.  Cook foods in healthier ways, such as by baking, broiling, or grilling.  Make a meal plan for the week, and shop with a grocery list to help you stay on track with your purchases. Try to avoid going to the grocery store when you are hungry.  When grocery shopping, try to shop around the outside of the store first, where the fresh foods are. Doing this helps you to avoid prepackaged foods, which can be high in sugar, salt (sodium), and fat. What lifestyle changes can be made?   Exercise for 30 or more minutes on 5 or more days each week. Exercising may include brisk walking, yard work, biking, running, swimming, and team sports like basketball and soccer. Ask your health care provider which exercises are safe for you.  Do muscle-strengthening activities, such as lifting weights or  using resistance bands, on 2 or more days a week.  Do not use any products that contain nicotine or tobacco, such as cigarettes and e-cigarettes. If you need help quitting, ask your health care provider.  Limit alcohol intake to no more than 1 drink a day for nonpregnant women and 2 drinks a day for men. One drink equals 12 oz of beer, 5 oz of wine, or 1 oz of hard liquor.  Try to get 7-9 hours of sleep each night. What other changes can be made?  Keep a food and activity journal to keep track of: ? What you ate and how many calories you had. Remember to count the calories in sauces, dressings, and side dishes. ? Whether you were active, and what exercises you did. ? Your calorie, weight, and  activity goals.  Check your weight regularly. Track any changes. If you notice you have gained weight, make changes to your diet or activity routine.  Avoid taking weight-loss medicines or supplements. Talk to your health care provider before starting any new medicine or supplement.  Talk to your health care provider before trying any new diet or exercise plan. Why are these changes important? Eating healthy, staying active, and having healthy habits can help you to prevent obesity. Those changes also:  Help you manage stress and emotions.  Help you connect with friends and family.  Improve your self-esteem.  Improve your sleep.  Prevent long-term health problems. What can happen if changes are not made? Being obese or overweight can cause you to develop joint or bone problems, which can make it hard for you to stay active or do activities you enjoy. Being obese or overweight also puts stress on your heart and lungs and can lead to health problems like diabetes, heart disease, and some cancers. Where to find more information Talk with your health care provider or a dietitian about healthy eating and healthy lifestyle choices. You may also find information from:  U.[place]. Department of  Agriculture, MyPlate: FormerBoss.no  American Heart Association: www.heart.org  Centers for Disease Control and Prevention: http://www.wolf.info/ Summary  Staying at a healthy weight is important to your overall health. It helps you to prevent certain diseases and health problems, such as heart disease, diabetes, joint problems, sleep disorders, and some types of cancer.  Being obese or overweight can cause you to develop joint or bone problems, which can make it hard for you to stay active or do activities you enjoy.  You can prevent unhealthy weight gain by eating a healthy diet, exercising regularly, not smoking, limiting alcohol, and getting enough sleep.  Talk with your health care provider or a dietitian for guidance about healthy eating and healthy lifestyle choices. This information is not intended to replace advice given to you by your health care provider. Make sure you discuss any questions you have with your health care provider. Document Released: 01/29/2016 Document Revised: 11/07/2016 Document Reviewed: 03/05/2016 Elsevier Interactive Patient Education  2019 [employer].

## 2018-03-11 NOTE — Assessment & Plan Note (Signed)
Seeing specialist; would to like ibuprofen, aware of risk to kidneys; check Cr and GFR

## 2018-03-14 LAB — LIPID PANEL
Cholesterol: 161 mg/dL (ref <200)
HDL: 47 mg/dL (ref >40)
LDL Cholesterol (Calc): 92 mg/dL (calc)
Non-HDL Cholesterol (Calc): 114 mg/dL (calc) (ref <130)
Total CHOL/HDL Ratio: 3.4 (calc) (ref <5.0)
Triglycerides: 119 mg/dL (ref <150)

## 2018-03-14 LAB — HEMOGLOBIN A1C
Hgb A1c MFr Bld: 5.8 % of total Hgb — ABNORMAL HIGH (ref <5.7)
Mean Plasma Glucose: 120 (calc)
eAG (mmol/L): 6.6 (calc)

## 2018-03-14 LAB — COMPLETE METABOLIC PANEL WITH GFR
AG Ratio: 1.6 (calc) (ref 1.0–2.5)
ALT: 32 U/L (ref 9–46)
AST: 20 U/L (ref 10–35)
Albumin: 4.4 g/dL (ref 3.6–5.1)
Alkaline phosphatase (APISO): 48 U/L (ref 40–115)
BILIRUBIN TOTAL: 0.5 mg/dL (ref 0.2–1.2)
BUN: 15 mg/dL (ref 7–25)
CHLORIDE: 110 mmol/L (ref 98–110)
CO2: 22 mmol/L (ref 20–32)
Calcium: 9.7 mg/dL (ref 8.6–10.3)
Creat: 0.97 mg/dL (ref 0.70–1.25)
GFR, Est African American: 94 mL/min/{1.73_m2} (ref >=60)
GFR, Est Non African American: 81 mL/min/{1.73_m2} (ref >=60)
GLOBULIN: 2.7 g/dL (ref 1.9–3.7)
Glucose, Bld: 101 mg/dL — ABNORMAL HIGH (ref 65–99)
Potassium: 4.6 mmol/L (ref 3.5–5.3)
SODIUM: 141 mmol/L (ref 135–146)
Total Protein: 7.1 g/dL (ref 6.1–8.1)

## 2018-03-14 LAB — CBC
HCT: 43.2 % (ref 38.5–50.0)
HEMOGLOBIN: 15.1 g/dL (ref 13.2–17.1)
MCH: 31.1 pg (ref 27.0–33.0)
MCHC: 35 g/dL (ref 32.0–36.0)
MCV: 88.9 fL (ref 80.0–100.0)
MPV: 10 fL (ref 7.5–12.5)
Platelets: 216 10*3/uL (ref 140–400)
RBC: 4.86 10*6/uL (ref 4.20–5.80)
RDW: 13.1 % (ref 11.0–15.0)
WBC: 8.8 10*3/uL (ref 3.8–10.8)

## 2018-03-14 LAB — ANA,IFA RA DIAG PNL W/RFLX TIT/PATN
Anti Nuclear Antibody(ANA): NEGATIVE
Cyclic Citrullin Peptide Ab: <16 UNITS
Rheumatoid fact SerPl-aCnc: <14 IU/mL (ref <14)

## 2018-03-15 ENCOUNTER — Other Ambulatory Visit: Payer: Self-pay | Admitting: Family Medicine

## 2018-03-15 DIAGNOSIS — E782 Mixed hyperlipidemia: Secondary | ICD-10-CM

## 2018-03-15 MED ORDER — SIMVASTATIN 40 MG PO TABS: 40.0000 mg | ORAL_TABLET | Freq: Every day | ORAL | 3 refills | Status: DC

## 2018-03-15 NOTE — Progress Notes (Signed)
Vincent Guerra, please let the patient know that his cholesterol is higher than we want; let's increase simvastatin from 20 mg to 40 mg; recheck lipids in 6-8 weeks (I'll order); new Rx sent in A1c is in prediabetes range; weight loss is key to slowing this down from turning into type 2 diabetes ANA is negative; other tests for RA are negative, good news  The 10-year ASCVD risk score Mikey Bussing DC Brooke Bonito., et al., 2013) is: 12.3%   Values used to calculate the score:     Age: 68 years     Sex: Male     Is Non-Hispanic African American: No     Diabetic: No     Tobacco smoker: No     Systolic Blood Pressure: 937 mmHg     Is BP treated: No     HDL Cholesterol: 47 mg/dL     Total Cholesterol: 161 mg/dL

## 2018-03-19 ENCOUNTER — Other Ambulatory Visit: Payer: Self-pay

## 2018-03-19 MED ORDER — SIMVASTATIN 40 MG PO TABS: 40.0000 mg | ORAL_TABLET | Freq: Every day | ORAL | 3 refills | Status: DC

## 2018-03-19 NOTE — Telephone Encounter (Signed)
I don't know who Dr. Lorelei Pont is I do not recommend long-term chronic use of ibuprofen for arthritis anyway, so I'll encourage him to talk to Dr. Lorelei Pont about alternatives for arthritis; ibuprofen can cause stomach ulcers, irreversible kidney damage, and hypertension

## 2018-03-19 NOTE — Telephone Encounter (Signed)
Pt.notified

## 2018-03-19 NOTE — Telephone Encounter (Signed)
Message from Yvette Rack sent at 03/19/2018 10:58 AM EST   Pt calling stating that Dr Lorelei Pont was supposed to have sent in his ibuprofen (ADVIL,MOTRIN) 800 MG tablet  And that the RX simvastatin (ZOCOR) 40 MG tablet was sent to the wrong pharmacy they now use the  [place], [place] - [place] [phone] (Phone) [phone] (Fax)

## 2018-03-31 ENCOUNTER — Other Ambulatory Visit: Payer: Self-pay | Admitting: Family Medicine

## 2018-07-08 DIAGNOSIS — M25562 Pain in left knee: Secondary | ICD-10-CM | POA: Diagnosis not present

## 2018-07-08 DIAGNOSIS — M1712 Unilateral primary osteoarthritis, left knee: Secondary | ICD-10-CM | POA: Diagnosis not present

## 2018-07-08 DIAGNOSIS — G8929 Other chronic pain: Secondary | ICD-10-CM | POA: Diagnosis not present

## 2018-07-30 DIAGNOSIS — L570 Actinic keratosis: Secondary | ICD-10-CM | POA: Diagnosis not present

## 2018-07-30 DIAGNOSIS — D2271 Melanocytic nevi of right lower limb, including hip: Secondary | ICD-10-CM | POA: Diagnosis not present

## 2018-07-30 DIAGNOSIS — D2262 Melanocytic nevi of left upper limb, including shoulder: Secondary | ICD-10-CM | POA: Diagnosis not present

## 2018-07-30 DIAGNOSIS — D225 Melanocytic nevi of trunk: Secondary | ICD-10-CM | POA: Diagnosis not present

## 2018-07-30 DIAGNOSIS — D2261 Melanocytic nevi of right upper limb, including shoulder: Secondary | ICD-10-CM | POA: Diagnosis not present

## 2018-07-30 DIAGNOSIS — D224 Melanocytic nevi of scalp and neck: Secondary | ICD-10-CM | POA: Diagnosis not present

## 2018-07-30 DIAGNOSIS — L821 Other seborrheic keratosis: Secondary | ICD-10-CM | POA: Diagnosis not present

## 2018-07-30 DIAGNOSIS — Z08 Encounter for follow-up examination after completed treatment for malignant neoplasm: Secondary | ICD-10-CM | POA: Diagnosis not present

## 2018-07-30 DIAGNOSIS — X32XXXA Exposure to sunlight, initial encounter: Secondary | ICD-10-CM | POA: Diagnosis not present

## 2018-07-30 DIAGNOSIS — D2272 Melanocytic nevi of left lower limb, including hip: Secondary | ICD-10-CM | POA: Diagnosis not present

## 2018-07-30 DIAGNOSIS — D485 Neoplasm of uncertain behavior of skin: Secondary | ICD-10-CM | POA: Diagnosis not present

## 2018-07-30 DIAGNOSIS — Z85828 Personal history of other malignant neoplasm of skin: Secondary | ICD-10-CM | POA: Diagnosis not present

## 2018-07-30 DIAGNOSIS — L538 Other specified erythematous conditions: Secondary | ICD-10-CM | POA: Diagnosis not present

## 2018-08-31 DIAGNOSIS — K219 Gastro-esophageal reflux disease without esophagitis: Secondary | ICD-10-CM | POA: Insufficient documentation

## 2018-08-31 NOTE — Progress Notes (Signed)
Name: Vincent Guerra   MRN: [id number]    DOB: 1950/07/21   Date:09/01/2018       Progress Note  Subjective  Chief Complaint  Chief Complaint  Patient presents with  . Follow-up    HPI Hyperlipidemia Patient rx simvatatin & fish oils daily. Takes medications as prescribed with no missed doses a month.  Denies myalgias Lab Results  Component Value Date   CHOL 161 03/11/2018   HDL 47 03/11/2018   LDLCALC 92 03/11/2018   TRIG 119 03/11/2018   CHOLHDL 3.4 03/11/2018   Wt Readings from Last 3 Encounters:  09/01/18 242 lb (109.8 kg)  03/11/18 254 lb (115.2 kg)  08/25/17 248 lb (112.5 kg)    Pre-diabetes Diet: eats green beans a few times a week not much else. Eats mostly chicken but occasional red meats. Eats deep fried rarely, has air fryer and grills a lot. Has been working on weight loss- has smaller portions, cutting down on carbs.  Lab Results  Component Value Date   HGBA1C 5.8 (H) 03/11/2018   Wt Readings from Last 3 Encounters:  09/01/18 242 lb (109.8 kg)  03/11/18 254 lb (115.2 kg)  08/25/17 248 lb (112.5 kg)    BPH  Takes finasteride 5mg  daily. Wakes up 1-2 times at night to pee. States on medication he doesn't dribble anymore and can feel complete bladder emptying.    Spondylosis of lumbar region and left knee pain Takes ibuprofen 800mg  in the morning and 600mg  in the evenings. States he cannot function when he does not take it. States he needs a knee replacement but is holding off until the winter if he can. He sees orthopedic- Dr. Ronnie Derby with [facility] for knee and shoulder Dr. Harl Bowie- neurosurgery for back.   Gastroesophageal reflux disease without esophagitis Takes famotidine BID every day.    PHQ2/9: Depression screen [facility] 2/9 09/01/2018 03/11/2018 08/25/2017 08/19/2017 08/03/2017  Decreased Interest 0 0 0 0 0  Down, Depressed, Hopeless 0 0 0 0 0  PHQ - 2 Score 0 0 0 0 0  Altered sleeping 0 0 - - -  Tired, decreased energy 0 0 - - -  Change in  appetite 0 0 - - -  Feeling bad or failure about yourself  0 0 - - -  Trouble concentrating 0 0 - - -  Moving slowly or fidgety/restless 0 0 - - -  Suicidal thoughts 0 0 - - -  PHQ-9 Score 0 0 - - -  Difficult doing work/chores Not difficult at all Not difficult at all - - -     PHQ reviewed. Negative  Patient Active Problem List   Diagnosis Date Noted  . GERD (gastroesophageal reflux disease) 08/31/2018  . Allergic rhinitis 03/11/2018  . Alcohol use 03/11/2018  . Obesity (BMI 35.0-39.9 without comorbidity) 08/03/2017  . Environmental and seasonal allergies 08/22/2016  . Diverticulosis 08/22/2016  . BPH with obstruction/lower urinary tract symptoms 08/22/2016  . Pre-diabetes 08/22/2016  . Osteoarthritis of multiple joints 08/22/2016  . Degenerative joint disease (DJD) of lumbar spine 08/22/2016  . History of sciatica 08/22/2016  . Hyperlipidemia 08/22/2016    Past Medical History:  Diagnosis Date  . Diverticulosis     Past Surgical History:  Procedure Laterality Date  . BACK SURGERY  01/17/2015   Dr Harl Bowie [facility]), Multiple diskectomy and herniated disc    Social History   Tobacco Use  . Smoking status: Former Smoker    Packs/day: 1.00    Types:  Cigarettes    Quit date: 26    Years since quitting: 26.5  . Smokeless tobacco: Former Network engineer Use Topics  . Alcohol use: Yes    Alcohol/week: 24.0 standard drinks    Types: 24 Cans of beer per week     Current Outpatient Medications:  .  aspirin EC 81 MG tablet, Take 81 mg by mouth., Disp: , Rfl:  .  diclofenac sodium (VOLTAREN) 1 % GEL, Apply 2 g topically 3 (three) times daily as needed. Use for hands and knee arthritis, joint pain, Disp: 100 g, Rfl: 2 .  famotidine (PEPCID) 20 MG tablet, Take 1 tablet (20 mg total) by mouth 2 (two) times daily as needed for heartburn or indigestion., Disp: 60 tablet, Rfl: 5 .  finasteride (PROSCAR) 5 MG tablet, Take 1 tablet (5 mg total) by mouth daily., Disp:  90 tablet, Rfl: 3 .  fluticasone (FLONASE) 50 MCG/ACT nasal spray, INHALE 2 SPRAYS IN EACH NOSTRIL ONCE IN THE MORNING, Disp: 16 g, Rfl: 10 .  ibuprofen (ADVIL,MOTRIN) 800 MG tablet, Take 1 tablet (800 mg total) by mouth every 8 (eight) hours as needed for mild pain or moderate pain (arthritis)., Disp: 270 tablet, Rfl: 1 .  loratadine (CLARITIN) 10 MG tablet, Take 10 mg by mouth., Disp: , Rfl:  .  Multiple Vitamin (MULTIVITAMIN) capsule, Take by mouth., Disp: , Rfl:  .  Omega-3 Fatty Acids (FISH OIL) 1200 MG CAPS, Take by mouth 2 (two) times daily., Disp: , Rfl:  .  simvastatin (ZOCOR) 40 MG tablet, Take 1 tablet (40 mg total) by mouth at bedtime., Disp: 90 tablet, Rfl: 3 .  Magnesium 100 MG CAPS, Take by mouth., Disp: , Rfl:   Allergies  Allergen Reactions  . Metronidazole Hives  . Tetanus Toxoid Adsorbed Hives    Review of Systems  Constitutional: Negative for chills, fever and malaise/fatigue.  Respiratory: Negative for cough and shortness of breath.   Cardiovascular: Negative for chest pain, palpitations and leg swelling.  Gastrointestinal: Negative for abdominal pain.  Musculoskeletal: Negative for joint pain and myalgias.  Skin: Negative for rash.  Neurological: Negative for dizziness and headaches.  Psychiatric/Behavioral: The patient is not nervous/anxious and does not have insomnia.       No other specific complaints in a complete review of systems (except as listed in HPI above).  Objective  Vitals:   09/01/18 0924  BP: 118/76  Pulse: 76  Resp: 14  Temp: (!) 96.2 F (35.7 C)  TempSrc: Temporal  SpO2: 98%  Weight: 242 lb (109.8 kg)  Height: 5\' 10"  (1.778 m)     Body mass index is 34.72 kg/m.  Nursing Note and Vital Signs reviewed.  Physical Exam Vitals signs reviewed.  Constitutional:      Appearance: He is well-developed.  HENT:     Head: Normocephalic and atraumatic.  Neck:     Musculoskeletal: Normal range of motion and neck supple.     Vascular:  No carotid bruit.  Cardiovascular:     Pulses: Normal pulses.     Heart sounds: Normal heart sounds.  Pulmonary:     Effort: Pulmonary effort is normal.     Breath sounds: Normal breath sounds.  Abdominal:     General: Bowel sounds are normal.     Palpations: Abdomen is soft.     Tenderness: There is no abdominal tenderness.  Musculoskeletal:     Left lower leg: Edema (mild, non-pitting, left knee brace) present.  Skin:  General: Skin is warm and dry.     Capillary Refill: Capillary refill takes less than 2 seconds.  Neurological:     Mental Status: He is alert and oriented to person, place, and time.     GCS: GCS eye subscore is 4. GCS verbal subscore is 5. GCS motor subscore is 6.     Sensory: No sensory deficit.  Psychiatric:        Speech: Speech normal.        Behavior: Behavior normal.        Thought Content: Thought content normal.        Judgment: Judgment normal.       No results found for this or any previous visit (from the past 48 hour(s)).  Assessment & Plan  1. Mixed hyperlipidemia Continue meds - Lipid Profile  2. Pre-diabetes Discussed diet.  - HgB A1c  3. Obesity (BMI 35.0-39.9 without comorbidity) Encouraged continue weight loss   4. BPH with obstruction/lower urinary tract symptoms Improved   5. Spondylosis of lumbar region without myelopathy or radiculopathy Follow up with ortho   6. Gastroesophageal reflux disease without esophagitis - famotidine (PEPCID) 20 MG tablet; Take 1 tablet (20 mg total) by mouth 2 (two) times daily as needed for heartburn or indigestion.  Dispense: 180 tablet; Refill: 1  7. Need for pneumococcal vaccination - Pneumococcal polysaccharide vaccine 23-valent greater than or equal to 2yo subcutaneous/IM

## 2018-09-01 ENCOUNTER — Other Ambulatory Visit: Payer: Self-pay

## 2018-09-01 ENCOUNTER — Encounter: Payer: Self-pay | Admitting: Nurse Practitioner

## 2018-09-01 ENCOUNTER — Ambulatory Visit (INDEPENDENT_AMBULATORY_CARE_PROVIDER_SITE_OTHER): Payer: Medicare Other | Admitting: Nurse Practitioner

## 2018-09-01 VITALS — BP 118/76 | HR 76 | Temp 96.2°F | Resp 14 | Ht 70.0 in | Wt 242.0 lb

## 2018-09-01 DIAGNOSIS — N401 Enlarged prostate with lower urinary tract symptoms: Secondary | ICD-10-CM | POA: Diagnosis not present

## 2018-09-01 DIAGNOSIS — E782 Mixed hyperlipidemia: Secondary | ICD-10-CM

## 2018-09-01 DIAGNOSIS — N138 Other obstructive and reflux uropathy: Secondary | ICD-10-CM

## 2018-09-01 DIAGNOSIS — K219 Gastro-esophageal reflux disease without esophagitis: Secondary | ICD-10-CM

## 2018-09-01 DIAGNOSIS — R7303 Prediabetes: Secondary | ICD-10-CM | POA: Diagnosis not present

## 2018-09-01 DIAGNOSIS — M47816 Spondylosis without myelopathy or radiculopathy, lumbar region: Secondary | ICD-10-CM

## 2018-09-01 DIAGNOSIS — Z23 Encounter for immunization: Secondary | ICD-10-CM

## 2018-09-01 DIAGNOSIS — E669 Obesity, unspecified: Secondary | ICD-10-CM | POA: Diagnosis not present

## 2018-09-01 MED ORDER — FAMOTIDINE 20 MG PO TABS: 20.0000 mg | ORAL_TABLET | Freq: Two times a day (BID) | ORAL | 1 refills | Status: DC | PRN

## 2018-09-01 NOTE — Patient Instructions (Signed)

## 2018-09-02 ENCOUNTER — Ambulatory Visit: Payer: Self-pay

## 2018-09-02 ENCOUNTER — Ambulatory Visit: Payer: Self-pay | Admitting: Family Medicine

## 2018-09-02 LAB — LIPID PANEL
Cholesterol: 149 mg/dL (ref <200)
HDL: 47 mg/dL (ref >=40)
LDL Cholesterol (Calc): 85 mg/dL (calc)
Non-HDL Cholesterol (Calc): 102 mg/dL (calc) (ref <130)
Total CHOL/HDL Ratio: 3.2 (calc) (ref <5.0)
Triglycerides: 79 mg/dL (ref <150)

## 2018-09-02 LAB — HEMOGLOBIN A1C
Hgb A1c MFr Bld: 5.6 % of total Hgb (ref <5.7)
Mean Plasma Glucose: 114 (calc)
eAG (mmol/L): 6.3 (calc)

## 2018-09-13 ENCOUNTER — Other Ambulatory Visit: Payer: Self-pay | Admitting: Family Medicine

## 2018-09-13 DIAGNOSIS — N138 Other obstructive and reflux uropathy: Secondary | ICD-10-CM

## 2018-10-08 ENCOUNTER — Ambulatory Visit (INDEPENDENT_AMBULATORY_CARE_PROVIDER_SITE_OTHER): Payer: Medicare Other

## 2018-10-08 VITALS — Temp 98.4°F | Ht 70.0 in | Wt 237.0 lb

## 2018-10-08 DIAGNOSIS — Z0000 Encounter for general adult medical examination without abnormal findings: Secondary | ICD-10-CM

## 2018-10-08 NOTE — Patient Instructions (Signed)
[name] , Thank you for taking time to come for your Medicare Wellness Visit. I appreciate your ongoing commitment to your health goals. Please review the following plan we discussed and let me know if I can assist you in the future.   Screening recommendations/referrals: Colonoscopy: done 2013. Repeat in 2023 Recommended yearly ophthalmology/optometry visit for glaucoma screening and checkup Recommended yearly dental visit for hygiene and checkup  Vaccinations: Influenza vaccine: done 12/02/17 Pneumococcal vaccine: done 09/01/18 Shingles vaccine: Shingrix discussed. Please contact your pharmacy for coverage information.  Advanced directives: Advance directive discussed with you today. I have provided a copy for you to complete at home and have notarized. Once this is complete please bring a copy in to our office so we can scan it into your chart.  Conditions/risks identified: recommend healthy eating and physical activity for desired weight loss  Next appointment: Please follow up in one year for your Medicare Annual Wellness visit.    Preventive Care 67 Years and Older, Male Preventive care refers to lifestyle choices and visits with your health care provider that can promote health and wellness. What does preventive care include?  A yearly physical exam. This is also called an annual well check.  Dental exams once or twice a year.  Routine eye exams. Ask your health care provider how often you should have your eyes checked.  Personal lifestyle choices, including:  Daily care of your teeth and gums.  Regular physical activity.  Eating a healthy diet.  Avoiding tobacco and drug use.  Limiting alcohol use.  Practicing safe sex.  Taking low doses of aspirin every day.  Taking vitamin and mineral supplements as recommended by your health care provider. What happens during an annual well check? The services and screenings done by your health care provider during your  annual well check will depend on your age, overall health, lifestyle risk factors, and family history of disease. Counseling  Your health care provider may ask you questions about your:  Alcohol use.  Tobacco use.  Drug use.  Emotional well-being.  Home and relationship well-being.  Sexual activity.  Eating habits.  History of falls.  Memory and ability to understand (cognition).  Work and work Statistician. Screening  You may have the following tests or measurements:  Height, weight, and BMI.  Blood pressure.  Lipid and cholesterol levels. These may be checked every 5 years, or more frequently if you are over 25 years old.  Skin check.  Lung cancer screening. You may have this screening every year starting at age 67 if you have a 30-pack-year history of smoking and currently smoke or have quit within the past 15 years.  Fecal occult blood test (FOBT) of the stool. You may have this test every year starting at age 67.  Flexible sigmoidoscopy or colonoscopy. You may have a sigmoidoscopy every 5 years or a colonoscopy every 10 years starting at age 67.  Prostate cancer screening. Recommendations will vary depending on your family history and other risks.  Hepatitis C blood test.  Hepatitis B blood test.  Sexually transmitted disease (STD) testing.  Diabetes screening. This is done by checking your blood sugar (glucose) after you have not eaten for a while (fasting). You may have this done every 1-3 years.  Abdominal aortic aneurysm (AAA) screening. You may need this if you are a current or former smoker.  Osteoporosis. You may be screened starting at age 67 if you are at high risk. Talk with your health care provider  about your test results, treatment options, and if necessary, the need for more tests. Vaccines  Your health care provider may recommend certain vaccines, such as:  Influenza vaccine. This is recommended every year.  Tetanus, diphtheria, and  acellular pertussis (Tdap, Td) vaccine. You may need a Td booster every 10 years.  Zoster vaccine. You may need this after age 67.  Pneumococcal 13-valent conjugate (PCV13) vaccine. One dose is recommended after age 67.  Pneumococcal polysaccharide (PPSV23) vaccine. One dose is recommended after age 67. Talk to your health care provider about which screenings and vaccines you need and how often you need them. This information is not intended to replace advice given to you by your health care provider. Make sure you discuss any questions you have with your health care provider. Document Released: 02/23/2015 Document Revised: 10/17/2015 Document Reviewed: 11/28/2014 Elsevier Interactive Patient Education  2017 [place] Prevention in the Home Falls can cause injuries. They can happen to people of all ages. There are many things you can do to make your home safe and to help prevent falls. What can I do on the outside of my home?  Regularly fix the edges of walkways and driveways and fix any cracks.  Remove anything that might make you trip as you walk through a door, such as a raised step or threshold.  Trim any bushes or trees on the path to your home.  Use bright outdoor lighting.  Clear any walking paths of anything that might make someone trip, such as rocks or tools.  Regularly check to see if handrails are loose or broken. Make sure that both sides of any steps have handrails.  Any raised decks and porches should have guardrails on the edges.  Have any leaves, snow, or ice cleared regularly.  Use sand or salt on walking paths during winter.  Clean up any spills in your garage right away. This includes oil or grease spills. What can I do in the bathroom?  Use night lights.  Install grab bars by the toilet and in the tub and shower. Do not use towel bars as grab bars.  Use non-skid mats or decals in the tub or shower.  If you need to sit down in the shower, use  a plastic, non-slip stool.  Keep the floor dry. Clean up any water that spills on the floor as soon as it happens.  Remove soap buildup in the tub or shower regularly.  Attach bath mats securely with double-sided non-slip rug tape.  Do not have throw rugs and other things on the floor that can make you trip. What can I do in the bedroom?  Use night lights.  Make sure that you have a light by your bed that is easy to reach.  Do not use any sheets or blankets that are too big for your bed. They should not hang down onto the floor.  Have a firm chair that has side arms. You can use this for support while you get dressed.  Do not have throw rugs and other things on the floor that can make you trip. What can I do in the kitchen?  Clean up any spills right away.  Avoid walking on wet floors.  Keep items that you use a lot in easy-to-reach places.  If you need to reach something above you, use a strong step stool that has a grab bar.  Keep electrical cords out of the way.  Do not use floor polish or  wax that makes floors slippery. If you must use wax, use non-skid floor wax.  Do not have throw rugs and other things on the floor that can make you trip. What can I do with my stairs?  Do not leave any items on the stairs.  Make sure that there are handrails on both sides of the stairs and use them. Fix handrails that are broken or loose. Make sure that handrails are as long as the stairways.  Check any carpeting to make sure that it is firmly attached to the stairs. Fix any carpet that is loose or worn.  Avoid having throw rugs at the top or bottom of the stairs. If you do have throw rugs, attach them to the floor with carpet tape.  Make sure that you have a light switch at the top of the stairs and the bottom of the stairs. If you do not have them, ask someone to add them for you. What else can I do to help prevent falls?  Wear shoes that:  Do not have high heels.  Have  rubber bottoms.  Are comfortable and fit you well.  Are closed at the toe. Do not wear sandals.  If you use a stepladder:  Make sure that it is fully opened. Do not climb a closed stepladder.  Make sure that both sides of the stepladder are locked into place.  Ask someone to hold it for you, if possible.  Clearly mark and make sure that you can see:  Any grab bars or handrails.  First and last steps.  Where the edge of each step is.  Use tools that help you move around (mobility aids) if they are needed. These include:  Canes.  Walkers.  Scooters.  Crutches.  Turn on the lights when you go into a dark area. Replace any light bulbs as soon as they burn out.  Set up your furniture so you have a clear path. Avoid moving your furniture around.  If any of your floors are uneven, fix them.  If there are any pets around you, be aware of where they are.  Review your medicines with your doctor. Some medicines can make you feel dizzy. This can increase your chance of falling. Ask your doctor what other things that you can do to help prevent falls. This information is not intended to replace advice given to you by your health care provider. Make sure you discuss any questions you have with your health care provider. Document Released: 11/23/2008 Document Revised: 07/05/2015 Document Reviewed: 03/03/2014 Elsevier Interactive Patient Education  2017 [employer].

## 2018-10-08 NOTE — Progress Notes (Signed)
Subjective:   Vincent Guerra is a 68 y.o. male who presents for Medicare Annual/Subsequent preventive examination.  Virtual Visit via Telephone Note  I connected with Vincent Guerra on 10/08/18 at 10:00 AM EDT by telephone and verified that I am speaking with the correct person using two identifiers.  Medicare Annual Wellness visit completed telephonically due to Covid-19 pandemic.   Location: Patient: home Provider: office   I discussed the limitations, risks, security and privacy concerns of performing an evaluation and management service by telephone and the availability of in person appointments. The patient expressed understanding and agreed to proceed.  Some vital signs may be absent or patient reported.   [name], LPN    Review of Systems:   Cardiac Risk Factors include: advanced age (>51men, >14 women);dyslipidemia;male gender;obesity (BMI >30kg/m2)     Objective:    Vitals: Temp 98.4 F (36.9 C)   Ht 5\' 10"  (1.778 m)   Wt 237 lb (107.5 kg)   BMI 34.01 kg/m   Body mass index is 34.01 kg/m.  Advanced Directives 10/08/2018 08/25/2017  Does Patient Have a Medical Advance Directive? No Yes  Would patient like information on creating a medical advance directive? Yes (MAU/Ambulatory/Procedural Areas - Information given) -    Tobacco Social History   Tobacco Use  Smoking Status Former Smoker  . Packs/day: 1.00  . Types: Cigarettes  . Quit date: 64  . Years since quitting: 26.6  Smokeless Tobacco Former Engineer, structural given: Not Answered   Clinical Intake:  Pre-visit preparation completed: Yes  Pain : No/denies pain     BMI - recorded: 34.01 Nutritional Status: BMI > 30  Obese Nutritional Risks: None Diabetes: No  How often do you need to have someone help you when you read instructions, pamphlets, or other written materials from your doctor or pharmacy?: 1 - Never  Interpreter Needed?: No  Information entered by :: [name] LPN  Past Medical History:  Diagnosis Date  . Allergy   . Arthritis   . Diverticulosis   . GERD (gastroesophageal reflux disease)   . Hyperlipidemia    Past Surgical History:  Procedure Laterality Date  . BACK SURGERY  01/17/2015   Dr Harl Bowie [facility]), Multiple diskectomy and herniated disc  . VASECTOMY     Family History  Problem Relation Age of Onset  . Heart disease Mother   . Alzheimer's disease Mother   . Heart attack Mother   . Prostate cancer Father 73  . Heart disease Brother 42       CABG  . Heart disease Brother        CABG   Social History   Socioeconomic History  . Marital status: Married    Spouse name: Blanch Media  . Number of children: 2  . Years of education: [employer]  . Highest education level: High school graduate  Occupational History  . 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 Financial resource strain: Not hard at all  . Food insecurity    Worry: Never true    Inability: Never true  . Transportation needs    Medical: No    Non-medical: No  Tobacco Use  . Smoking status: Former Smoker    Packs/day: 1.00    Types: Cigarettes    Quit date: 1994    Years since quitting: 26.6  . Smokeless tobacco: Former Network engineer and Sexual Activity  . Alcohol use:  Yes    Alcohol/week: 24.0 standard drinks    Types: 24 Cans of beer per week  . Drug use: No  . Sexual activity: Not on file  Lifestyle  . Physical activity    Days per week: 0 days    Minutes per session: 0 min  . Stress: Not at all  Relationships  . Social connections    Talks on phone: More than three times a week    Gets together: Twice a week    Attends religious service: Never    Active member of club or organization: No    Attends meetings of clubs or organizations: Never    Relationship status: Married  Other Topics Concern  . Not on file  Social History Narrative  . Not on file    Outpatient Encounter Medications as  of 10/08/2018  Medication Sig  . aspirin EC 81 MG tablet Take 81 mg by mouth.  . diclofenac sodium (VOLTAREN) 1 % GEL Apply 2 g topically 3 (three) times daily as needed. Use for hands and knee arthritis, joint pain  . famotidine (PEPCID) 20 MG tablet Take 1 tablet (20 mg total) by mouth 2 (two) times daily as needed for heartburn or indigestion.  . finasteride (PROSCAR) 5 MG tablet TAKE 1 TABLET BY MOUTH DAILY  . fluticasone (FLONASE) 50 MCG/ACT nasal spray INHALE 2 SPRAYS IN EACH NOSTRIL ONCE IN THE MORNING  . ibuprofen (ADVIL,MOTRIN) 800 MG tablet Take 1 tablet (800 mg total) by mouth every 8 (eight) hours as needed for mild pain or moderate pain (arthritis).  Marland Kitchen loratadine (CLARITIN) 10 MG tablet Take 10 mg by mouth.  . Magnesium 100 MG CAPS Take by mouth.  . Multiple Vitamin (MULTIVITAMIN) capsule Take by mouth.  . Omega-3 Fatty Acids (FISH OIL) 1200 MG CAPS Take by mouth 2 (two) times daily.  . simvastatin (ZOCOR) 40 MG tablet Take 1 tablet (40 mg total) by mouth at bedtime.   No facility-administered encounter medications on file as of 10/08/2018.     Activities of Daily Living In your present state of health, do you have any difficulty performing the following activities: 10/08/2018 09/01/2018  Hearing? N N  Comment declines hearing aids -  Vision? N N  Difficulty concentrating or making decisions? N N  Walking or climbing stairs? N N  Dressing or bathing? N N  Doing errands, shopping? N N  Preparing Food and eating ? N -  Using the Toilet? N -  In the past six months, have you accidently leaked urine? N -  Do you have problems with loss of bowel control? N -  Managing your Medications? N -  Managing your Finances? N -  Housekeeping or managing your Housekeeping? N -  Some recent data might be hidden    Patient Care Team: Lada, Satira Anis, MD as PCP - General (Family Medicine) Vickey Huger, MD as Consulting Physician (Orthopedic Surgery) Jenne Campus, MD as Referring  Physician (Neurosurgery)   Assessment:   This is a routine wellness examination for Vincent Guerra.  Exercise Activities and Dietary recommendations Current Exercise Habits: The patient does not participate in regular exercise at present, Exercise limited by: orthopedic condition(s)(knee pain)  Goals    . Weight (lb) < 200 lb (90.7 kg)     patient wants to loose 10 pounds in 6 months.       Fall Risk Fall Risk  10/08/2018 09/01/2018 03/11/2018 08/25/2017 08/19/2017  Falls in the past year? 0 0 0 No No  Number falls in past yr: 0 0 - - -  Injury with Fall? 0 0 - - -  Follow up Falls prevention discussed - - - -    Depression Screen PHQ 2/9 Scores 10/08/2018 09/01/2018 03/11/2018 08/25/2017  PHQ - 2 Score 0 0 0 0  PHQ- 9 Score - 0 0 -   FALL RISK PREVENTION PERTAINING TO THE HOME:  Any stairs in or around the home? Yes  If so, do they handrails? Yes   Home free of loose throw rugs in walkways, pet beds, electrical cords, etc? Yes  Adequate lighting in your home to reduce risk of falls? Yes   ASSISTIVE DEVICES UTILIZED TO PREVENT FALLS:  Life alert? No  Use of a cane, walker or w/c? No  Grab bars in the bathroom? Yes  Shower chair or bench in shower? Yes  Elevated toilet seat or a handicapped toilet? Yes   DME ORDERS:  DME order needed?  No   TIMED UP AND GO:  Was the test performed? No .  Telephonic visit.   Education: Fall risk prevention has been discussed.  Intervention(s) required? No     Cognitive Function MMSE - Mini Mental State Exam 08/25/2017  Orientation to time 5  Orientation to Place 5  Registration 3  Attention/ Calculation 5  Recall 3  Language- name 2 objects 2  Language- repeat 1  Language- follow 3 step command 3  Language- read & follow direction 1  Write a sentence 1  Copy design 1  Total score 30     6CIT Screen 10/08/2018  What Year? 0 points  What month? 0 points  What time? 0 points  Count back from 20 0 points  Months in reverse 0 points   Repeat phrase 0 points  Total Score 0    Immunization History  Administered Date(s) Administered  . Influenza-Unspecified 12/02/2017  . Pneumococcal Conjugate-13 03/02/2017  . Pneumococcal Polysaccharide-23 09/01/2018    Qualifies for Shingles Vaccine? Yes . Due for Shingrix. Education has been provided regarding the importance of this vaccine. Pt has been advised to call insurance company to determine out of pocket expense. Advised may also receive vaccine at local pharmacy or Health Dept. Verbalized acceptance and understanding.  Tdap: n/a pt allergic to tetanus  Flu Vaccine: Up to date  Pneumococcal Vaccine: Up to date   Screening Tests Health Maintenance  Topic Date Due  . INFLUENZA VACCINE  09/11/2018  . TETANUS/TDAP  08/23/2026 (Originally 09/06/1969)  . COLONOSCOPY  02/10/2021  . Hepatitis C Screening  Completed  . PNA vac Low Risk Adult  Completed   Cancer Screenings:  Colorectal Screening: Completed 2013. Repeat every 10 years  Lung Cancer Screening: (Low Dose CT Chest recommended if Age 72-80 years, 30 pack-year currently smoking OR have quit w/in 15years.) does not qualify.   Additional Screening:  Hepatitis C Screening: does qualify; Completed 07/29/17  Vision Screening: Recommended annual ophthalmology exams for early detection of glaucoma and other disorders of the eye. Is the patient up to date with their annual eye exam?  Yes  Who is the provider or what is the name of the office in which the pt attends annual eye exams? [place] Screening: Recommended annual dental exams for proper oral hygiene  Community Resource Referral:  CRR required this visit?  No       Plan:    I have personally reviewed and addressed the Medicare Annual Wellness questionnaire and have noted the following  in the patient's chart:  A. Medical and social history B. Use of alcohol, tobacco or illicit drugs  C. Current medications and supplements D.  Functional ability and status E.  Nutritional status F.  Physical activity G. Advance directives H. List of other physicians I.  Hospitalizations, surgeries, and ER visits in previous 12 months J.  [place] such as hearing and vision if needed, cognitive and depression L. Referrals and appointments   In addition, I have reviewed and discussed with patient certain preventive protocols, quality metrics, and best practice recommendations. A written personalized care plan for preventive services as well as general preventive health recommendations were provided to patient.   Signed,  [name], LPN Nurse Health Advisor   Nurse Notes: pt doing well and appreciative of visit today

## 2018-10-12 DIAGNOSIS — X32XXXA Exposure to sunlight, initial encounter: Secondary | ICD-10-CM | POA: Diagnosis not present

## 2018-10-12 DIAGNOSIS — L570 Actinic keratosis: Secondary | ICD-10-CM | POA: Diagnosis not present

## 2018-11-09 ENCOUNTER — Ambulatory Visit (INDEPENDENT_AMBULATORY_CARE_PROVIDER_SITE_OTHER): Payer: Medicare Other | Admitting: Family Medicine

## 2018-11-09 ENCOUNTER — Encounter: Payer: Self-pay | Admitting: Family Medicine

## 2018-11-09 ENCOUNTER — Other Ambulatory Visit: Payer: Self-pay

## 2018-11-09 VITALS — BP 120/84 | HR 73 | Temp 97.8°F | Resp 16 | Ht 70.0 in | Wt 242.7 lb

## 2018-11-09 DIAGNOSIS — E782 Mixed hyperlipidemia: Secondary | ICD-10-CM | POA: Diagnosis not present

## 2018-11-09 DIAGNOSIS — E669 Obesity, unspecified: Secondary | ICD-10-CM

## 2018-11-09 DIAGNOSIS — M150 Primary generalized (osteo)arthritis: Secondary | ICD-10-CM

## 2018-11-09 DIAGNOSIS — Z23 Encounter for immunization: Secondary | ICD-10-CM | POA: Diagnosis not present

## 2018-11-09 DIAGNOSIS — Z01818 Encounter for other preprocedural examination: Secondary | ICD-10-CM

## 2018-11-09 DIAGNOSIS — M159 Polyosteoarthritis, unspecified: Secondary | ICD-10-CM

## 2018-11-09 DIAGNOSIS — R7303 Prediabetes: Secondary | ICD-10-CM | POA: Diagnosis not present

## 2018-11-09 NOTE — Progress Notes (Signed)
Name: Vincent Guerra   MRN: [id number]    DOB: [birth year]   Date:11/09/2018       Progress Note  Subjective  Chief Complaint  Chief Complaint  Patient presents with  . Medical Clearance    Surgical Clearance for left knee replacement surgery    HPI  PT presents for surgical clearance for LEFT knee replacement.  He forgot his paperwork, but has a picture of it on his phone.  He does not know the date of his surgery at this time, but it is upcoming in the next few months - waiting on clearance.  He needs A1C, Hgb/HCt checked; it has also been 9 months since last CMP; last Lipids in July 2020 were at goal.  He is taking ASA 81mg  daily (advised to stop 7 days prior to surgery and restart 24 hours after) and statin therapy daily (advised to continue).  BMI <40.  Discussed his ETOH use - advised to stop use at least 7 days prior to surgery.  He is a former smoker - over 20 years ago.  Patient Active Problem List   Diagnosis Date Noted  . GERD (gastroesophageal reflux disease) 08/31/2018  . Allergic rhinitis 03/11/2018  . Alcohol use 03/11/2018  . Obesity (BMI 35.0-39.9 without comorbidity) 08/03/2017  . Environmental and seasonal allergies 08/22/2016  . Diverticulosis 08/22/2016  . BPH with obstruction/lower urinary tract symptoms 08/22/2016  . Pre-diabetes 08/22/2016  . Osteoarthritis of multiple joints 08/22/2016  . Degenerative joint disease (DJD) of lumbar spine 08/22/2016  . History of sciatica 08/22/2016  . Hyperlipidemia 08/22/2016    Past Surgical History:  Procedure Laterality Date  . BACK SURGERY  01/17/2015   Dr Harl Bowie [facility]), Multiple diskectomy and herniated disc  . VASECTOMY      Family History  Problem Relation Age of Onset  . Heart disease Mother   . Alzheimer's disease Mother   . Heart attack Mother   . Prostate cancer Father 29  . Heart disease Brother 20       CABG  . Heart disease Brother        CABG    Social History   Socioeconomic  History  . Marital status: Married    Spouse name: Blanch Media  . Number of children: 2  . Years of education: [employer]  . Highest education level: High school graduate  Occupational History  . 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 Financial resource strain: Not hard at all  . Food insecurity    Worry: Never true    Inability: Never true  . Transportation needs    Medical: No    Non-medical: No  Tobacco Use  . Smoking status: Former Smoker    Packs/day: 1.00    Types: Cigarettes    Quit date: 1994    Years since quitting: 26.7  . Smokeless tobacco: Former Network engineer and Sexual Activity  . Alcohol use: Yes    Alcohol/week: 24.0 standard drinks    Types: 24 Cans of beer per week  . Drug use: No  . Sexual activity: Not on file  Lifestyle  . Physical activity    Days per week: 0 days    Minutes per session: 0 min  . Stress: Not at all  Relationships  . Social connections    Talks on phone: More than three times a week    Gets together: Twice a week    Attends religious service: Never  Active member of club or organization: No    Attends meetings of clubs or organizations: Never    Relationship status: Married  . Intimate partner violence    Fear of current or ex partner: No    Emotionally abused: No    Physically abused: No    Forced sexual activity: No  Other Topics Concern  . Not on file  Social History Narrative  . Not on file     Current Outpatient Medications:  .  aspirin EC 81 MG tablet, Take 81 mg by mouth., Disp: , Rfl:  .  diclofenac sodium (VOLTAREN) 1 % GEL, Apply 2 g topically 3 (three) times daily as needed. Use for hands and knee arthritis, joint pain, Disp: 100 g, Rfl: 2 .  famotidine (PEPCID) 20 MG tablet, Take 1 tablet (20 mg total) by mouth 2 (two) times daily as needed for heartburn or indigestion., Disp: 180 tablet, Rfl: 1 .  finasteride (PROSCAR) 5 MG tablet, TAKE 1 TABLET BY MOUTH DAILY, Disp: 90  tablet, Rfl: 0 .  fluticasone (FLONASE) 50 MCG/ACT nasal spray, INHALE 2 SPRAYS IN EACH NOSTRIL ONCE IN THE MORNING, Disp: 16 g, Rfl: 10 .  ibuprofen (ADVIL,MOTRIN) 800 MG tablet, Take 1 tablet (800 mg total) by mouth every 8 (eight) hours as needed for mild pain or moderate pain (arthritis)., Disp: 270 tablet, Rfl: 1 .  loratadine (CLARITIN) 10 MG tablet, Take 10 mg by mouth., Disp: , Rfl:  .  Magnesium 100 MG CAPS, Take by mouth., Disp: , Rfl:  .  Multiple Vitamin (MULTIVITAMIN) capsule, Take by mouth., Disp: , Rfl:  .  Omega-3 Fatty Acids (FISH OIL) 1200 MG CAPS, Take by mouth 2 (two) times daily., Disp: , Rfl:  .  simvastatin (ZOCOR) 40 MG tablet, Take 1 tablet (40 mg total) by mouth at bedtime., Disp: 90 tablet, Rfl: 3  Allergies  Allergen Reactions  . Metronidazole Hives  . Tetanus Toxoid Adsorbed Hives    I personally reviewed active problem list, medication list, allergies, health maintenance, notes from last encounter, lab results with the patient/caregiver today.   ROS  Constitutional: Negative for fever or weight change.  Respiratory: Negative for cough and shortness of breath.   Cardiovascular: Negative for chest pain or palpitations.  Gastrointestinal: Negative for abdominal pain, no bowel changes.  Musculoskeletal: Negative for gait problem or joint swelling.  Skin: Negative for rash.  Neurological: Negative for dizziness or headache.  No other specific complaints in a complete review of systems (except as listed in HPI above).  Objective  Vitals:   11/09/18 0805  BP: 120/84  Pulse: 73  Resp: 16  Temp: 97.8 F (36.6 C)  SpO2: 97%  Weight: 242 lb 11.2 oz (110.1 kg)  Height: 5\' 10"  (1.778 m)    Body mass index is 34.82 kg/m.  Physical Exam Constitutional: Patient appears well-developed and well-nourished. No distress.  HENT: Head: Normocephalic and atraumatic. Eyes: Conjunctivae and EOM are normal. No scleral icterus.   Neck: Normal range of motion. Neck  supple. No JVD present.  Cardiovascular: Normal rate, regular rhythm and normal heart sounds.  No murmur heard. No BLE edema. Pulmonary/Chest: Effort normal and breath sounds normal. No respiratory distress. Abdominal: Soft. Bowel sounds are normal, no distension. There is no tenderness. No masses. Musculoskeletal: Normal range of motion, no joint effusions. No gross deformities Neurological: Pt is alert and oriented to person, place, and time. No cranial nerve deficit. Coordination, balance, strength, speech and gait are normal.  Skin:  Skin is warm and dry. No rash noted. No erythema.  Psychiatric: Patient has a normal mood and affect. behavior is normal. Judgment and thought content normal.  No results found for this or any previous visit (from the past 72 hour(s)).   PHQ2/9: Depression screen [facility] 2/9 11/09/2018 10/08/2018 09/01/2018 03/11/2018 08/25/2017  Decreased Interest 0 0 0 0 0  Down, Depressed, Hopeless 0 0 0 0 0  PHQ - 2 Score 0 0 0 0 0  Altered sleeping 1 - 0 0 -  Tired, decreased energy 0 - 0 0 -  Change in appetite 0 - 0 0 -  Feeling bad or failure about yourself  0 - 0 0 -  Trouble concentrating 0 - 0 0 -  Moving slowly or fidgety/restless 0 - 0 0 -  Suicidal thoughts 0 - 0 0 -  PHQ-9 Score 1 - 0 0 -  Difficult doing work/chores Somewhat difficult - Not difficult at all Not difficult at all -   PHQ-2/9 Result is negative.    Fall Risk: Fall Risk  11/09/2018 10/08/2018 09/01/2018 03/11/2018 08/25/2017  Falls in the past year? 0 0 0 0 No  Number falls in past yr: 0 0 0 - -  Injury with Fall? 0 0 0 - -  Follow up - Falls prevention discussed - - -   Functional Status Survey: Is the patient deaf or have difficulty hearing?: No(left ear ringing) Does the patient have difficulty seeing, even when wearing glasses/contacts?: No Does the patient have difficulty concentrating, remembering, or making decisions?: No Does the patient have difficulty dressing or bathing?: No Does the  patient have difficulty doing errands alone such as visiting a doctor's office or shopping?: No  Assessment & Plan  1. Pre-op evaluation - HLD and Age put him in moderate risk category, all other risk factors are controlled. - COMPLETE METABOLIC PANEL WITH GFR - CBC with Differential/Platelet - Hemoglobin A1c  2. Flu vaccine need - Flu Vaccine QUAD High Dose(Fluad)  3. Mixed hyperlipidemia - Continue statin therapy; stop ASA 81mg  for 7 days prior to surgery and restart 24 hours afterwards.  4. Pre-diabetes - COMPLETE METABOLIC PANEL WITH GFR - Hemoglobin A1c  5. Obesity (BMI 35.0-39.9 without comorbidity) - COMPLETE METABOLIC PANEL WITH GFR - CBC with Differential/Platelet - Hemoglobin A1c  6. Primary osteoarthritis involving multiple joints - Planning LEFT TKR upcoming.

## 2018-11-10 LAB — HEMOGLOBIN A1C
Hgb A1c MFr Bld: 5.6 % of total Hgb (ref <5.7)
Mean Plasma Glucose: 114 (calc)
eAG (mmol/L): 6.3 (calc)

## 2018-11-10 LAB — CBC WITH DIFFERENTIAL/PLATELET
Absolute Monocytes: 671 cells/uL (ref 200–950)
Basophils Absolute: 78 cells/uL (ref 0–200)
Basophils Relative: 1 %
Eosinophils Absolute: 367 cells/uL (ref 15–500)
Eosinophils Relative: 4.7 %
HCT: 44.3 % (ref 38.5–50.0)
Hemoglobin: 15.2 g/dL (ref 13.2–17.1)
Lymphs Abs: 1888 cells/uL (ref 850–3900)
MCH: 30.8 pg (ref 27.0–33.0)
MCHC: 34.3 g/dL (ref 32.0–36.0)
MCV: 89.7 fL (ref 80.0–100.0)
MPV: 9.7 fL (ref 7.5–12.5)
Monocytes Relative: 8.6 %
Neutro Abs: 4797 cells/uL (ref 1500–7800)
Neutrophils Relative %: 61.5 %
Platelets: 215 10*3/uL (ref 140–400)
RBC: 4.94 10*6/uL (ref 4.20–5.80)
RDW: 13 % (ref 11.0–15.0)
Total Lymphocyte: 24.2 %
WBC: 7.8 10*3/uL (ref 3.8–10.8)

## 2018-11-10 LAB — COMPLETE METABOLIC PANEL WITH GFR
AG Ratio: 1.7 (calc) (ref 1.0–2.5)
ALT: 26 U/L (ref 9–46)
AST: 23 U/L (ref 10–35)
Albumin: 4.5 g/dL (ref 3.6–5.1)
Alkaline phosphatase (APISO): 47 U/L (ref 35–144)
BUN: 16 mg/dL (ref 7–25)
CO2: 23 mmol/L (ref 20–32)
Calcium: 9.7 mg/dL (ref 8.6–10.3)
Chloride: 109 mmol/L (ref 98–110)
Creat: 0.91 mg/dL (ref 0.70–1.25)
GFR, Est African American: 101 mL/min/{1.73_m2} (ref >=60)
GFR, Est Non African American: 87 mL/min/{1.73_m2} (ref >=60)
Globulin: 2.6 g/dL (calc) (ref 1.9–3.7)
Glucose, Bld: 109 mg/dL — ABNORMAL HIGH (ref 65–99)
Potassium: 4.6 mmol/L (ref 3.5–5.3)
Sodium: 141 mmol/L (ref 135–146)
Total Bilirubin: 0.6 mg/dL (ref 0.2–1.2)
Total Protein: 7.1 g/dL (ref 6.1–8.1)

## 2018-11-16 ENCOUNTER — Other Ambulatory Visit: Payer: Self-pay | Admitting: Family Medicine

## 2018-11-16 DIAGNOSIS — M47816 Spondylosis without myelopathy or radiculopathy, lumbar region: Secondary | ICD-10-CM

## 2018-11-16 DIAGNOSIS — M159 Polyosteoarthritis, unspecified: Secondary | ICD-10-CM

## 2018-11-16 MED ORDER — IBUPROFEN 800 MG PO TABS: 800.0000 mg | ORAL_TABLET | Freq: Three times a day (TID) | ORAL | 1 refills | Status: DC | PRN

## 2018-11-16 NOTE — Progress Notes (Signed)
Spoke with patient directly today in office - requests Rx for PRN 800mg  ibuprofen - has upcoming TKR - will provide temporary Rx until this occurs.  He is aware of risk of taking long term NSAIDS - advised to take with food.

## 2018-12-13 ENCOUNTER — Other Ambulatory Visit: Payer: Self-pay | Admitting: Orthopedic Surgery

## 2018-12-21 ENCOUNTER — Other Ambulatory Visit: Payer: Self-pay

## 2018-12-21 ENCOUNTER — Other Ambulatory Visit: Payer: Self-pay | Admitting: Family Medicine

## 2018-12-21 DIAGNOSIS — N138 Other obstructive and reflux uropathy: Secondary | ICD-10-CM

## 2018-12-21 DIAGNOSIS — N401 Enlarged prostate with lower urinary tract symptoms: Secondary | ICD-10-CM

## 2018-12-21 MED ORDER — FINASTERIDE 5 MG PO TABS: 5.0000 mg | ORAL_TABLET | Freq: Every day | ORAL | 0 refills | Status: DC

## 2018-12-31 DIAGNOSIS — M25562 Pain in left knee: Secondary | ICD-10-CM | POA: Diagnosis not present

## 2019-01-03 NOTE — Progress Notes (Signed)
PCP - Dr. Enid Derry MD Cardiologist - none  Chest x-ray -  EKG -  Stress Test -  ECHO -  Cardiac Cath -   Sleep Study -  CPAP -   Fasting Blood Sugar - does not check  Checks Blood Sugar _____ times a day  Blood Thinner Instructions:  Aspirin Instructions: 81 mg Last Dose: 01/03/2019  Anesthesia review:   Patient denies shortness of breath, fever, cough and chest pain at PAT appointment   Patient verbalized understanding of instructions that were given to them at the PAT appointment. Patient was also instructed that they will need to review over the PAT instructions again at home before surgery.

## 2019-01-03 NOTE — Patient Instructions (Addendum)
DUE TO COVID-19 ONLY ONE VISITOR IS ALLOWED TO COME WITH YOU AND STAY IN THE WAITING ROOM ONLY DURING PRE OP AND PROCEDURE DAY OF SURGERY. THE 1 VISITOR MAY VISIT WITH YOU AFTER SURGERY IN YOUR PRIVATE ROOM DURING VISITING HOURS ONLY!  YOU NEED TO HAVE A COVID 19 TEST ON_Friday 11/27/2020______ @__08 :55am_____, THIS TEST MUST BE DONE BEFORE SURGERY, COME  [name], [address] [place] , [address].  ([place]) ONCE YOUR COVID TEST IS COMPLETED, PLEASE BEGIN THE QUARANTINE INSTRUCTIONS AS OUTLINED IN YOUR HANDOUT.                Vincent Guerra    Your procedure is scheduled on: Monday 01/10/2019   Report to [facility] Main  Entrance    Report to Short Stay at 5:30 AM     Call this number if you have problems the morning of surgery [phone]    Remember: Do not eat food after Midnight.   BRUSH YOUR TEETH MORNING OF SURGERY AND RINSE YOUR MOUTH OUT, NO CHEWING GUM CANDY OR MINTS.  NO SOLID FOOD AFTER MIDNIGHT THE NIGHT PRIOR TO SURGERY.   NOTHING BY MOUTH EXCEPT CLEAR LIQUIDS UNTIL 5:00 am.   PLEASE FINISH ENSURE DRINK PER SURGEON ORDER  WHICH NEEDS TO BE COMPLETED AT 5:00am .     CLEAR LIQUID DIET   Foods Allowed                                                                     Foods Excluded  Coffee and tea, regular and decaf                             liquids that you cannot  Plain Jell-O any favor except red or purple             see through such as: Fruit ices (not with fruit pulp)                                     milk, soups, orange juice  Iced Popsicles                                    All solid food Carbonated beverages, regular and diet                                    Cranberry, grape and apple juices Sports drinks like Gatorade Lightly seasoned clear broth or consume(fat free) Sugar, honey syrup  Sample Menu Breakfast                                Lunch                                     Supper Cranberry juice  Beef broth                            Chicken broth Jell-O                                     Grape juice                           Apple juice Coffee or tea                        Jell-O                                      Popsicle                                                Coffee or tea                        Coffee or tea  _____________________________________________________________________       Take these medicines the morning of surgery with A SIP OF WATER: Claritin if needed, Flonase if needed, Finasteride (Proscar) Famotidine (Pepcid) if needed                                 You may not have any metal on your body including hair pins and              piercings  Do not wear jewelry, make-up, lotions, powders or cologne, deodorant             Men may shave face and neck.   Do not bring valuables to the hospital. [place].  Contacts, dentures or bridgework may not be worn into surgery.  Leave suitcase in the car. After surgery it may be brought to your room.                  Please read over the following fact sheets you were given: _____________________________________________________________________             [facility] - Preparing for Surgery Before surgery, you can play an important role.  Because skin is not sterile, your skin needs to be as free of germs as possible.  You can reduce the number of germs on your skin by washing with CHG (chlorahexidine gluconate) soap before surgery.  CHG is an antiseptic cleaner which kills germs and bonds with the skin to continue killing germs even after washing. Please DO NOT use if you have an allergy to CHG or antibacterial soaps.  If your skin becomes reddened/irritated stop using the CHG and inform your nurse when you arrive at Short Stay. Do not shave (including legs and underarms) for at least 48 hours prior to the first CHG shower.  You may shave your  face/neck. Please follow these instructions carefully:  1.  Shower with CHG Soap the night  before surgery and the  morning of Surgery.  2.  If you choose to wash your hair, wash your hair first as usual with your  normal  shampoo.  3.  After you shampoo, rinse your hair and body thoroughly to remove the  shampoo.                            4.  Use CHG as you would any other liquid soap.  You can apply chg directly  to the skin and wash                       Gently with a scrungie or clean washcloth.  5.  Apply the CHG Soap to your body ONLY FROM THE NECK DOWN.   Do not use on face/ open                           Wound or open sores. Avoid contact with eyes, ears mouth and genitals (private parts).                       Wash face,  Genitals (private parts) with your normal soap.             6.  Wash thoroughly, paying special attention to the area where your surgery  will be performed.  7.  Thoroughly rinse your body with warm water from the neck down.  8.  DO NOT shower/wash with your normal soap after using and rinsing off  the CHG Soap.                9.  Pat yourself dry with a clean towel.            10.  Wear clean pajamas.            11.  Place clean sheets on your bed the night of your first shower and do not  sleep with pets. Day of Surgery : Do not apply any lotions/deodorants the morning of surgery.  Please wear clean clothes to the hospital/surgery center.  FAILURE TO FOLLOW THESE INSTRUCTIONS MAY RESULT IN THE CANCELLATION OF YOUR SURGERY PATIENT SIGNATURE_________________________________  NURSE SIGNATURE__________________________________  ________________________________________________________________________   Vincent Guerra  An incentive spirometer is a tool that can help keep your lungs clear and active. This tool measures how well you are filling your lungs with each breath. Taking long deep breaths may help reverse or decrease the chance of developing breathing  (pulmonary) problems (especially infection) following:  A long period of time when you are unable to move or be active. BEFORE THE PROCEDURE   If the spirometer includes an indicator to show your best effort, your nurse or respiratory therapist will set it to a desired goal.  If possible, sit up straight or lean slightly forward. Try not to slouch.  Hold the incentive spirometer in an upright position. INSTRUCTIONS FOR USE  1. Sit on the edge of your bed if possible, or sit up as far as you can in bed or on a chair. 2. Hold the incentive spirometer in an upright position. 3. Breathe out normally. 4. Place the mouthpiece in your mouth and seal your lips tightly around it. 5. Breathe in slowly and as deeply as possible, raising the piston or the ball toward the top of the column. 6. Hold your breath for 3-5 seconds  or for as long as possible. Allow the piston or ball to fall to the bottom of the column. 7. Remove the mouthpiece from your mouth and breathe out normally. 8. Rest for a few seconds and repeat Steps 1 through 7 at least 10 times every 1-2 hours when you are awake. Take your time and take a few normal breaths between deep breaths. 9. The spirometer may include an indicator to show your best effort. Use the indicator as a goal to work toward during each repetition. 10. After each set of 10 deep breaths, practice coughing to be sure your lungs are clear. If you have an incision (the cut made at the time of surgery), support your incision when coughing by placing a pillow or rolled up towels firmly against it. Once you are able to get out of bed, walk around indoors and cough well. You may stop using the incentive spirometer when instructed by your caregiver.  RISKS AND COMPLICATIONS  Take your time so you do not get dizzy or light-headed.  If you are in pain, you may need to take or ask for pain medication before doing incentive spirometry. It is harder to take a deep breath if you  are having pain. AFTER USE  Rest and breathe slowly and easily.  It can be helpful to keep track of a log of your progress. Your caregiver can provide you with a simple table to help with this. If you are using the spirometer at home, follow these instructions: [place] IF:   You are having difficultly using the spirometer.  You have trouble using the spirometer as often as instructed.  Your pain medication is not giving enough relief while using the spirometer.  You develop fever of 100.5 F (38.1 C) or higher. SEEK IMMEDIATE MEDICAL CARE IF:   You cough up bloody sputum that had not been present before.  You develop fever of 102 F (38.9 C) or greater.  You develop worsening pain at or near the incision site. MAKE SURE YOU:   Understand these instructions.  Will watch your condition.  Will get help right away if you are not doing well or get worse. Document Released: 06/09/2006 Document Revised: 04/21/2011 Document Reviewed: 08/10/2006 [facility] Patient Information 2014 [place], [place].   ________________________________________________________________________

## 2019-01-04 ENCOUNTER — Encounter (HOSPITAL_COMMUNITY)
Admission: RE | Admit: 2019-01-04 | Discharge: 2019-01-04 | Disposition: A | Discharge status: Home / Self Care | Payer: Medicare Other | Source: Ambulatory Visit | Attending: Orthopedic Surgery | Admitting: Orthopedic Surgery

## 2019-01-04 ENCOUNTER — Other Ambulatory Visit: Payer: Self-pay

## 2019-01-04 ENCOUNTER — Encounter (HOSPITAL_COMMUNITY): Payer: Self-pay

## 2019-01-04 DIAGNOSIS — Z01812 Encounter for preprocedural laboratory examination: Secondary | ICD-10-CM | POA: Insufficient documentation

## 2019-01-04 DIAGNOSIS — M1909 Primary osteoarthritis, other specified site: Secondary | ICD-10-CM | POA: Insufficient documentation

## 2019-01-04 LAB — HEMOGLOBIN A1C
Hgb A1c MFr Bld: 5.8 % — ABNORMAL HIGH (ref 4.8–5.6)
Mean Plasma Glucose: 119.76 mg/dL

## 2019-01-04 LAB — COMPREHENSIVE METABOLIC PANEL
ALT: 34 U/L (ref 0–44)
AST: 26 U/L (ref 15–41)
Albumin: 4.4 g/dL (ref 3.5–5.0)
Alkaline Phosphatase: 44 U/L (ref 38–126)
Anion gap: 9 (ref 5–15)
BUN: 20 mg/dL (ref 8–23)
CO2: 20 mmol/L — ABNORMAL LOW (ref 22–32)
Calcium: 9.1 mg/dL (ref 8.9–10.3)
Chloride: 109 mmol/L (ref 98–111)
Creatinine, Ser: 0.9 mg/dL (ref 0.61–1.24)
GFR calc Af Amer: >60 mL/min (ref >60)
GFR calc non Af Amer: >60 mL/min (ref >60)
Glucose, Bld: 123 mg/dL — ABNORMAL HIGH (ref 70–99)
Potassium: 4.4 mmol/L (ref 3.5–5.1)
Sodium: 138 mmol/L (ref 135–145)
Total Bilirubin: 0.6 mg/dL (ref 0.3–1.2)
Total Protein: 7.5 g/dL (ref 6.5–8.1)

## 2019-01-04 LAB — CBC WITH DIFFERENTIAL/PLATELET
Abs Immature Granulocytes: 0.04 10*3/uL (ref 0.00–0.07)
Basophils Absolute: 0.1 10*3/uL (ref 0.0–0.1)
Basophils Relative: 1 %
Eosinophils Absolute: 0.4 10*3/uL (ref 0.0–0.5)
Eosinophils Relative: 4 %
HCT: 44.9 % (ref 39.0–52.0)
Hemoglobin: 15 g/dL (ref 13.0–17.0)
Immature Granulocytes: 1 %
Lymphocytes Relative: 22 %
Lymphs Abs: 1.9 10*3/uL (ref 0.7–4.0)
MCH: 30.5 pg (ref 26.0–34.0)
MCHC: 33.4 g/dL (ref 30.0–36.0)
MCV: 91.4 fL (ref 80.0–100.0)
Monocytes Absolute: 0.8 10*3/uL (ref 0.1–1.0)
Monocytes Relative: 9 %
Neutro Abs: 5.4 10*3/uL (ref 1.7–7.7)
Neutrophils Relative %: 63 %
Platelets: 188 10*3/uL (ref 150–400)
RBC: 4.91 MIL/uL (ref 4.22–5.81)
RDW: 12.9 % (ref 11.5–15.5)
WBC: 8.5 10*3/uL (ref 4.0–10.5)
nRBC: 0 % (ref 0.0–0.2)

## 2019-01-04 LAB — SURGICAL PCR SCREEN
MRSA, PCR: NEGATIVE
Staphylococcus aureus: NEGATIVE

## 2019-01-04 LAB — GLUCOSE, CAPILLARY: Glucose-Capillary: 113 mg/dL — ABNORMAL HIGH (ref 70–99)

## 2019-01-05 ENCOUNTER — Ambulatory Visit (HOSPITAL_COMMUNITY): Payer: Medicare Other | Admitting: Physician Assistant

## 2019-01-05 ENCOUNTER — Ambulatory Visit (HOSPITAL_COMMUNITY): Payer: Medicare Other | Admitting: Anesthesiology

## 2019-01-07 ENCOUNTER — Other Ambulatory Visit (HOSPITAL_COMMUNITY)
Admission: RE | Admit: 2019-01-07 | Discharge: 2019-01-07 | Disposition: A | Discharge status: Home / Self Care | Payer: Medicare Other | Source: Ambulatory Visit | Attending: Orthopedic Surgery | Admitting: Orthopedic Surgery

## 2019-01-07 DIAGNOSIS — Z01812 Encounter for preprocedural laboratory examination: Secondary | ICD-10-CM | POA: Insufficient documentation

## 2019-01-07 DIAGNOSIS — Z20828 Contact with and (suspected) exposure to other viral communicable diseases: Secondary | ICD-10-CM | POA: Diagnosis not present

## 2019-01-07 LAB — SARS CORONAVIRUS 2 (TAT 6-24 HRS): SARS Coronavirus 2: NEGATIVE

## 2019-01-09 MED ORDER — BUPIVACAINE LIPOSOME 1.3 % IJ SUSP
20.0000 mL | INTRAMUSCULAR | Status: DC
  Filled 2019-01-09: qty 20

## 2019-01-09 NOTE — Anesthesia Preprocedure Evaluation (Signed)
Anesthesia Evaluation    Reviewed: Allergy & Precautions, Patient's Chart, lab work & pertinent test results  History of Anesthesia Complications Negative for: history of anesthetic complications  Airway        Dental   Pulmonary former smoker,           Cardiovascular negative cardio ROS       Neuro/Psych negative neurological ROS  negative psych ROS   GI/Hepatic GERD  Medicated,(+)     substance abuse  alcohol use,   Endo/Other   Obesity   Renal/GU negative Renal ROS     Musculoskeletal  (+) Arthritis ,   Abdominal   Peds  Hematology negative hematology ROS (+)  Plt 188k    Anesthesia Other Findings Covid negative 11/27  Reproductive/Obstetrics                             Anesthesia Physical Anesthesia Plan  ASA: II  Anesthesia Plan: Spinal   Post-op Pain Management:  Regional for Post-op pain   Induction:   PONV Risk Score and Plan: 1 and Treatment may vary due to age or medical condition and Propofol infusion  Airway Management Planned: Natural Airway and Simple Face Mask  Additional Equipment: None  Intra-op Plan:   Post-operative Plan:   Informed Consent:   Plan Discussed with: CRNA and Anesthesiologist  Anesthesia Plan Comments:         Anesthesia Quick Evaluation

## 2019-01-10 ENCOUNTER — Encounter (HOSPITAL_COMMUNITY): Payer: Self-pay | Admitting: Emergency Medicine

## 2019-01-10 ENCOUNTER — Ambulatory Visit (HOSPITAL_COMMUNITY)
Admission: RE | Admit: 2019-01-10 | Discharge: 2019-01-10 | Disposition: A | Discharge status: Home / Self Care | Payer: Medicare Other | Attending: Orthopedic Surgery | Admitting: Orthopedic Surgery

## 2019-01-10 ENCOUNTER — Other Ambulatory Visit: Payer: Self-pay

## 2019-01-10 ENCOUNTER — Encounter (HOSPITAL_COMMUNITY)
Admission: RE | Disposition: A | Discharge status: Home / Self Care | Payer: Self-pay | Source: Home / Self Care | Attending: Orthopedic Surgery

## 2019-01-10 ENCOUNTER — Other Ambulatory Visit: Payer: Self-pay | Admitting: Orthopedic Surgery

## 2019-01-10 ENCOUNTER — Encounter: Payer: Self-pay | Admitting: Orthopedic Surgery

## 2019-01-10 DIAGNOSIS — Z791 Long term (current) use of non-steroidal anti-inflammatories (NSAID): Secondary | ICD-10-CM | POA: Diagnosis not present

## 2019-01-10 DIAGNOSIS — Z8042 Family history of malignant neoplasm of prostate: Secondary | ICD-10-CM | POA: Diagnosis not present

## 2019-01-10 DIAGNOSIS — M170 Bilateral primary osteoarthritis of knee: Secondary | ICD-10-CM | POA: Diagnosis present

## 2019-01-10 DIAGNOSIS — Z820 Family history of epilepsy and other diseases of the nervous system: Secondary | ICD-10-CM | POA: Insufficient documentation

## 2019-01-10 DIAGNOSIS — Z6834 Body mass index (BMI) 34.0-34.9, adult: Secondary | ICD-10-CM | POA: Diagnosis not present

## 2019-01-10 DIAGNOSIS — Z5309 Procedure and treatment not carried out because of other contraindication: Secondary | ICD-10-CM | POA: Diagnosis not present

## 2019-01-10 DIAGNOSIS — Z87891 Personal history of nicotine dependence: Secondary | ICD-10-CM | POA: Diagnosis not present

## 2019-01-10 DIAGNOSIS — Z881 Allergy status to other antibiotic agents status: Secondary | ICD-10-CM | POA: Diagnosis not present

## 2019-01-10 DIAGNOSIS — Z7982 Long term (current) use of aspirin: Secondary | ICD-10-CM | POA: Insufficient documentation

## 2019-01-10 DIAGNOSIS — E785 Hyperlipidemia, unspecified: Secondary | ICD-10-CM | POA: Diagnosis not present

## 2019-01-10 DIAGNOSIS — M5136 Other intervertebral disc degeneration, lumbar region: Secondary | ICD-10-CM | POA: Diagnosis not present

## 2019-01-10 DIAGNOSIS — K219 Gastro-esophageal reflux disease without esophagitis: Secondary | ICD-10-CM | POA: Insufficient documentation

## 2019-01-10 DIAGNOSIS — R7303 Prediabetes: Secondary | ICD-10-CM | POA: Diagnosis not present

## 2019-01-10 DIAGNOSIS — Z887 Allergy status to serum and vaccine status: Secondary | ICD-10-CM | POA: Diagnosis not present

## 2019-01-10 DIAGNOSIS — Z79899 Other long term (current) drug therapy: Secondary | ICD-10-CM | POA: Insufficient documentation

## 2019-01-10 DIAGNOSIS — E669 Obesity, unspecified: Secondary | ICD-10-CM | POA: Diagnosis not present

## 2019-01-10 DIAGNOSIS — Z8249 Family history of ischemic heart disease and other diseases of the circulatory system: Secondary | ICD-10-CM | POA: Diagnosis not present

## 2019-01-10 LAB — GLUCOSE, CAPILLARY: Glucose-Capillary: 142 mg/dL — ABNORMAL HIGH (ref 70–99)

## 2019-01-10 SURGERY — ARTHROPLASTY, KNEE, TOTAL
Anesthesia: Spinal | Site: Knee | Laterality: Left

## 2019-01-10 MED ORDER — GABAPENTIN 300 MG PO CAPS
300.0000 mg | ORAL_CAPSULE | Freq: Once | ORAL | Status: AC
  Administered 2019-01-10: 300 mg via ORAL
  Filled 2019-01-10: qty 1

## 2019-01-10 MED ORDER — FENTANYL CITRATE (PF) 100 MCG/2ML IJ SOLN
50.0000 ug | INTRAMUSCULAR | Status: DC
  Filled 2019-01-10: qty 2

## 2019-01-10 MED ORDER — CHLORHEXIDINE GLUCONATE 4 % EX LIQD: 60.0000 mL | Freq: Once | CUTANEOUS | Status: DC

## 2019-01-10 MED ORDER — PROPOFOL 10 MG/ML IV BOLUS
INTRAVENOUS | Status: AC
  Filled 2019-01-10: qty 20

## 2019-01-10 MED ORDER — PROPOFOL 500 MG/50ML IV EMUL
INTRAVENOUS | Status: AC
  Filled 2019-01-10: qty 50

## 2019-01-10 MED ORDER — ACETAMINOPHEN 500 MG PO TABS
1000.0000 mg | ORAL_TABLET | Freq: Once | ORAL | Status: AC
  Administered 2019-01-10: 1000 mg via ORAL
  Filled 2019-01-10: qty 2

## 2019-01-10 MED ORDER — MIDAZOLAM HCL 2 MG/2ML IJ SOLN
1.0000 mg | INTRAMUSCULAR | Status: DC
  Filled 2019-01-10: qty 2

## 2019-01-10 MED ORDER — CEFAZOLIN SODIUM-DEXTROSE 2-4 GM/100ML-% IV SOLN
2.0000 g | INTRAVENOUS | Status: DC
  Filled 2019-01-10: qty 100

## 2019-01-10 MED ORDER — ONDANSETRON HCL 4 MG/2ML IJ SOLN
INTRAMUSCULAR | Status: AC
  Filled 2019-01-10: qty 2

## 2019-01-10 MED ORDER — DEXAMETHASONE SODIUM PHOSPHATE 10 MG/ML IJ SOLN: 8.0000 mg | Freq: Once | INTRAMUSCULAR | Status: DC

## 2019-01-10 MED ORDER — TRANEXAMIC ACID-NACL 1000-0.7 MG/100ML-% IV SOLN
1000.0000 mg | INTRAVENOUS | Status: DC
  Filled 2019-01-10: qty 100

## 2019-01-10 MED ORDER — POVIDONE-IODINE 10 % EX SWAB
2.0000 "application " | Freq: Once | CUTANEOUS | Status: AC
  Administered 2019-01-10: 2 via TOPICAL

## 2019-01-10 MED ORDER — CELECOXIB 200 MG PO CAPS
400.0000 mg | ORAL_CAPSULE | Freq: Once | ORAL | Status: AC
  Administered 2019-01-10: 400 mg via ORAL
  Filled 2019-01-10: qty 2

## 2019-01-10 MED ORDER — LACTATED RINGERS IV SOLN
INTRAVENOUS | Status: DC
  Administered 2019-01-10: 07:00:00 via INTRAVENOUS

## 2019-01-10 MED ORDER — DEXAMETHASONE SODIUM PHOSPHATE 10 MG/ML IJ SOLN
INTRAMUSCULAR | Status: AC
  Filled 2019-01-10: qty 1

## 2019-01-10 NOTE — Progress Notes (Signed)
Pt cancelled for surgery today due to going to store after getting covid tested.   Pt calling daughter to come pick him up at this time due to cancelled procedure.  IV removed and fluids stopped, pt verbalized understanding to call Dr. Ruel Favors office to reschedule.

## 2019-01-10 NOTE — H&P (Signed)
Vincent Guerra MRN:  [id number] DOB/SEX:  June 03, 1950/male  CHIEF COMPLAINT:  Painful left Knee  HISTORY: Patient is a 68 y.o. male presented with a history of pain in the left knee. Onset of symptoms was gradual starting a few years ago with gradually worsening course since that time. Patient has been treated conservatively with over-the-counter NSAIDs and activity modification. Patient currently rates pain in the knee at 10 out of 10 with activity. There is pain at night.  PAST MEDICAL HISTORY: Patient Active Problem List   Diagnosis Date Noted  . GERD (gastroesophageal reflux disease) 08/31/2018  . Allergic rhinitis 03/11/2018  . Alcohol use 03/11/2018  . Obesity (BMI 35.0-39.9 without comorbidity) 08/03/2017  . Environmental and seasonal allergies 08/22/2016  . Diverticulosis 08/22/2016  . BPH with obstruction/lower urinary tract symptoms 08/22/2016  . Pre-diabetes 08/22/2016  . Osteoarthritis of multiple joints 08/22/2016  . Degenerative joint disease (DJD) of lumbar spine 08/22/2016  . History of sciatica 08/22/2016  . Hyperlipidemia 08/22/2016   Past Medical History:  Diagnosis Date  . Allergy   . Arthritis   . Diverticulosis   . GERD (gastroesophageal reflux disease)   . Hyperlipidemia    Past Surgical History:  Procedure Laterality Date  . BACK SURGERY  01/17/2015   Dr Harl Bowie [facility]), Multiple diskectomy and herniated disc  . VASECTOMY       MEDICATIONS:   Medications Prior to Admission  Medication Sig Dispense Refill Last Dose  . aspirin EC 81 MG tablet Take 81 mg by mouth at bedtime.      . diclofenac sodium (VOLTAREN) 1 % GEL Apply 2 g topically 3 (three) times daily as needed. Use for hands and knee arthritis, joint pain (Patient taking differently: Apply 2 g topically 3 (three) times daily as needed (shoulder pain/hands & knee pain.). ) 100 g 2   . famotidine (PEPCID) 20 MG tablet Take 1 tablet (20 mg total) by mouth 2 (two) times daily as needed  for heartburn or indigestion. (Patient taking differently: Take 20 mg by mouth 2 (two) times daily. ) 180 tablet 1   . finasteride (PROSCAR) 5 MG tablet Take 1 tablet (5 mg total) by mouth daily. 90 tablet 0   . fluticasone (FLONASE) 50 MCG/ACT nasal spray INHALE 2 SPRAYS IN EACH NOSTRIL ONCE IN THE MORNING (Patient taking differently: Place 2 sprays into both nostrils daily at 2 PM. ) 16 g 10   . ibuprofen (ADVIL) 200 MG tablet Take 600 mg by mouth every evening.     Marland Kitchen ibuprofen (ADVIL) 800 MG tablet Take 1 tablet (800 mg total) by mouth every 8 (eight) hours as needed for mild pain or moderate pain (arthritis). (Patient taking differently: Take 800 mg by mouth daily. ) 120 tablet 1   . loratadine (CLARITIN) 10 MG tablet Take 10 mg by mouth 2 (two) times daily.      . Magnesium 500 MG TABS Take 500 mg by mouth at bedtime.     . Multiple Vitamin (MULTIVITAMIN WITH MINERALS) TABS tablet Take 1 tablet by mouth at bedtime.     . Omega-3 Fatty Acids (FISH OIL) 1200 MG CAPS Take 1,200 mg by mouth 2 (two) times daily.      . simvastatin (ZOCOR) 40 MG tablet TAKE ONE TABLET AT BEDTIME (Patient taking differently: Take 40 mg by mouth at bedtime. ) 90 tablet 3   . Turmeric 500 MG CAPS Take 500 mg by mouth every evening.     . Magnesium 100  MG CAPS Take by mouth.       ALLERGIES:   Allergies  Allergen Reactions  . Metronidazole Hives  . Tetanus Toxoid Adsorbed Hives    REVIEW OF SYSTEMS:  A comprehensive review of systems was negative except for: Musculoskeletal: positive for arthralgias and bone pain   FAMILY HISTORY:   Family History  Problem Relation Age of Onset  . Heart disease Mother   . Alzheimer's disease Mother   . Heart attack Mother   . Prostate cancer Father 38  . Heart disease Brother 7       CABG  . Heart disease Brother        CABG    SOCIAL HISTORY:   Social History   Tobacco Use  . Smoking status: Former Smoker    Packs/day: 1.00    Types: Cigarettes    Quit date:  1994    Years since quitting: 26.9  . Smokeless tobacco: Never Used  Substance Use Topics  . Alcohol use: Yes    Alcohol/week: 24.0 standard drinks    Types: 24 Cans of beer per week     EXAMINATION:  Vital signs in last 24 hours:    There were no vitals taken for this visit.  General Appearance:    Alert, cooperative, no distress, appears stated age  Head:    Normocephalic, without obvious abnormality, atraumatic  Eyes:    PERRL, conjunctiva/corneas clear, EOM's intact, fundi    benign, both eyes       Ears:    Normal TM's and external ear canals, both ears  Nose:   Nares normal, septum midline, mucosa normal, no drainage    or sinus tenderness  Throat:   Lips, mucosa, and tongue normal; teeth and gums normal  Neck:   Supple, symmetrical, trachea midline, no adenopathy;       thyroid:  No enlargement/tenderness/nodules; no carotid   bruit or JVD  Back:     Symmetric, no curvature, ROM normal, no CVA tenderness  Lungs:     Clear to auscultation bilaterally, respirations unlabored  Chest wall:    No tenderness or deformity  Heart:    Regular rate and rhythm, S1 and S2 normal, no murmur, rub   or gallop  Abdomen:     Soft, non-tender, bowel sounds active all four quadrants,    no masses, no organomegaly  Genitalia:    Normal male without lesion, discharge or tenderness  Rectal:    Normal tone, normal prostate, no masses or tenderness;   guaiac negative stool  Extremities:   Extremities normal, atraumatic, no cyanosis or edema  Pulses:   2+ and symmetric all extremities  Skin:   Skin color, texture, turgor normal, no rashes or lesions  Lymph nodes:   Cervical, supraclavicular, and axillary nodes normal  Neurologic:   CNII-XII intact. Normal strength, sensation and reflexes      throughout    Musculoskeletal:  ROM 0-120, Ligaments intact,  Imaging Review Plain radiographs demonstrate severe degenerative joint disease of the left knee. The overall alignment is neutral. The  bone quality appears to be good for age and reported activity level.  Assessment/Plan: Primary osteoarthritis, left knee   The patient history, physical examination and imaging studies are consistent with advanced degenerative joint disease of the left knee. The patient has failed conservative treatment.  The clearance notes were reviewed.  After discussion with the patient it was felt that Total Knee Replacement was indicated. The procedure,  risks, and benefits of  total knee arthroplasty were presented and reviewed. The risks including but not limited to aseptic loosening, infection, blood clots, vascular injury, stiffness, patella tracking problems complications among others were discussed. The patient acknowledged the explanation, agreed to proceed with the plan.  Preoperative templating of the joint replacement has been completed, documented, and submitted to the Operating Room personnel in order to optimize intra-operative equipment management.    Patient's anticipated LOS is less than 2 midnights, meeting these requirements: - Lives within 1 hour of care - Has a competent adult at home to recover with post-op recover - NO history of  - Chronic pain requiring opiods  - Diabetes  - Coronary Artery Disease  - Heart failure  - Heart attack  - Stroke  - DVT/VTE  - Cardiac arrhythmia  - Respiratory Failure/COPD  - Renal failure  - Anemia  - Advanced Liver disease       Donia Ast 01/10/2019, 6:15 AM

## 2019-01-10 NOTE — Progress Notes (Signed)
Pt ambulatory to lobby to wait on daughter to come pick him up.

## 2019-01-13 ENCOUNTER — Other Ambulatory Visit (HOSPITAL_COMMUNITY)
Admission: RE | Admit: 2019-01-13 | Discharge: 2019-01-13 | Disposition: A | Discharge status: Home / Self Care | Payer: Medicare Other | Source: Ambulatory Visit | Attending: Orthopedic Surgery | Admitting: Orthopedic Surgery

## 2019-01-13 DIAGNOSIS — Z01812 Encounter for preprocedural laboratory examination: Secondary | ICD-10-CM | POA: Insufficient documentation

## 2019-01-13 DIAGNOSIS — Z20828 Contact with and (suspected) exposure to other viral communicable diseases: Secondary | ICD-10-CM | POA: Diagnosis not present

## 2019-01-16 ENCOUNTER — Encounter (HOSPITAL_COMMUNITY): Payer: Self-pay | Admitting: Anesthesiology

## 2019-01-16 LAB — NOVEL CORONAVIRUS, NAA (HOSP ORDER, SEND-OUT TO REF LAB; TAT 18-24 HRS): SARS-CoV-2, NAA: NOT DETECTED

## 2019-01-16 MED ORDER — BUPIVACAINE LIPOSOME 1.3 % IJ SUSP
20.0000 mL | Freq: Once | INTRAMUSCULAR | Status: DC
  Filled 2019-01-16: qty 20

## 2019-01-16 NOTE — Anesthesia Preprocedure Evaluation (Addendum)
Anesthesia Evaluation  Patient identified by MRN, date of birth, ID band Patient awake    Reviewed: Allergy & Precautions, H&P , NPO status , Patient's Chart, lab work & pertinent test results  Airway Mallampati: II  TM Distance: >3 FB Neck ROM: Full    Dental no notable dental hx. (+) Teeth Intact, Dental Advisory Given   Pulmonary neg pulmonary ROS, former smoker,    Pulmonary exam normal breath sounds clear to auscultation       Cardiovascular Exercise Tolerance: Good negative cardio ROS Normal cardiovascular exam Rhythm:Regular Rate:Normal     Neuro/Psych negative neurological ROS  negative psych ROS   GI/Hepatic negative GI ROS, Neg liver ROS, GERD  Medicated,  Endo/Other  negative endocrine ROS  Renal/GU negative Renal ROS  negative genitourinary   Musculoskeletal negative musculoskeletal ROS (+) Arthritis , Osteoarthritis,    Abdominal   Peds negative pediatric ROS (+)  Hematology negative hematology ROS (+)   Anesthesia Other Findings   Reproductive/Obstetrics negative OB ROS                           Anesthesia Physical Anesthesia Plan  ASA: II  Anesthesia Plan: Spinal   Post-op Pain Management:  Regional for Post-op pain   Induction:   PONV Risk Score and Plan: 2 and Ondansetron  Airway Management Planned: Nasal Cannula, Simple Face Mask and Mask  Additional Equipment:   Intra-op Plan:   Post-operative Plan:   Informed Consent: I have reviewed the patients History and Physical, chart, labs and discussed the procedure including the risks, benefits and alternatives for the proposed anesthesia with the patient or authorized representative who has indicated his/her understanding and acceptance.       Plan Discussed with: Anesthesiologist, CRNA and Surgeon  Anesthesia Plan Comments: (  )       Anesthesia Quick Evaluation

## 2019-01-17 ENCOUNTER — Ambulatory Visit (HOSPITAL_COMMUNITY)
Admission: RE | Admit: 2019-01-17 | Discharge: 2019-01-17 | Disposition: A | Discharge status: Home / Self Care | Payer: Medicare Other | Attending: Orthopedic Surgery | Admitting: Orthopedic Surgery

## 2019-01-17 ENCOUNTER — Encounter (HOSPITAL_COMMUNITY)
Admission: RE | Disposition: A | Discharge status: Home / Self Care | Payer: Self-pay | Source: Home / Self Care | Attending: Orthopedic Surgery

## 2019-01-17 ENCOUNTER — Ambulatory Visit (HOSPITAL_COMMUNITY): Payer: Medicare Other | Admitting: Anesthesiology

## 2019-01-17 ENCOUNTER — Encounter (HOSPITAL_COMMUNITY): Payer: Self-pay

## 2019-01-17 DIAGNOSIS — M25562 Pain in left knee: Secondary | ICD-10-CM | POA: Diagnosis present

## 2019-01-17 DIAGNOSIS — G8918 Other acute postprocedural pain: Secondary | ICD-10-CM | POA: Diagnosis not present

## 2019-01-17 DIAGNOSIS — Z87891 Personal history of nicotine dependence: Secondary | ICD-10-CM | POA: Diagnosis not present

## 2019-01-17 DIAGNOSIS — Z79899 Other long term (current) drug therapy: Secondary | ICD-10-CM | POA: Diagnosis not present

## 2019-01-17 DIAGNOSIS — Z7982 Long term (current) use of aspirin: Secondary | ICD-10-CM | POA: Insufficient documentation

## 2019-01-17 DIAGNOSIS — M25762 Osteophyte, left knee: Secondary | ICD-10-CM | POA: Diagnosis not present

## 2019-01-17 DIAGNOSIS — E785 Hyperlipidemia, unspecified: Secondary | ICD-10-CM | POA: Diagnosis not present

## 2019-01-17 DIAGNOSIS — K219 Gastro-esophageal reflux disease without esophagitis: Secondary | ICD-10-CM | POA: Diagnosis not present

## 2019-01-17 DIAGNOSIS — M1712 Unilateral primary osteoarthritis, left knee: Secondary | ICD-10-CM | POA: Insufficient documentation

## 2019-01-17 HISTORY — PX: TOTAL KNEE ARTHROPLASTY: SHX125

## 2019-01-17 LAB — CBC WITH DIFFERENTIAL/PLATELET
Abs Immature Granulocytes: 0.04 10*3/uL (ref 0.00–0.07)
Basophils Absolute: 0.1 10*3/uL (ref 0.0–0.1)
Basophils Relative: 1 %
Eosinophils Absolute: 0.3 10*3/uL (ref 0.0–0.5)
Eosinophils Relative: 3 %
HCT: 43.9 % (ref 39.0–52.0)
Hemoglobin: 14.7 g/dL (ref 13.0–17.0)
Immature Granulocytes: 0 %
Lymphocytes Relative: 21 %
Lymphs Abs: 1.9 10*3/uL (ref 0.7–4.0)
MCH: 30.5 pg (ref 26.0–34.0)
MCHC: 33.5 g/dL (ref 30.0–36.0)
MCV: 91.1 fL (ref 80.0–100.0)
Monocytes Absolute: 0.8 10*3/uL (ref 0.1–1.0)
Monocytes Relative: 9 %
Neutro Abs: 5.8 10*3/uL (ref 1.7–7.7)
Neutrophils Relative %: 66 %
Platelets: 209 10*3/uL (ref 150–400)
RBC: 4.82 MIL/uL (ref 4.22–5.81)
RDW: 12.9 % (ref 11.5–15.5)
WBC: 8.9 10*3/uL (ref 4.0–10.5)
nRBC: 0 % (ref 0.0–0.2)

## 2019-01-17 LAB — COMPREHENSIVE METABOLIC PANEL
ALT: 33 U/L (ref 0–44)
AST: 19 U/L (ref 15–41)
Albumin: 4.1 g/dL (ref 3.5–5.0)
Alkaline Phosphatase: 49 U/L (ref 38–126)
Anion gap: 12 (ref 5–15)
BUN: 19 mg/dL (ref 8–23)
CO2: 23 mmol/L (ref 22–32)
Calcium: 9.6 mg/dL (ref 8.9–10.3)
Chloride: 108 mmol/L (ref 98–111)
Creatinine, Ser: 0.86 mg/dL (ref 0.61–1.24)
GFR calc Af Amer: >60 mL/min (ref >60)
GFR calc non Af Amer: >60 mL/min (ref >60)
Glucose, Bld: 129 mg/dL — ABNORMAL HIGH (ref 70–99)
Potassium: 3.9 mmol/L (ref 3.5–5.1)
Sodium: 143 mmol/L (ref 135–145)
Total Bilirubin: 0.4 mg/dL (ref 0.3–1.2)
Total Protein: 7.3 g/dL (ref 6.5–8.1)

## 2019-01-17 SURGERY — ARTHROPLASTY, KNEE, TOTAL
Anesthesia: Spinal | Site: Knee | Laterality: Left

## 2019-01-17 MED ORDER — ONDANSETRON HCL 4 MG/2ML IJ SOLN
INTRAMUSCULAR | Status: DC | PRN
  Administered 2019-01-17: 4 mg via INTRAVENOUS

## 2019-01-17 MED ORDER — MEPERIDINE HCL 50 MG/ML IJ SOLN: 6.2500 mg | INTRAMUSCULAR | Status: DC | PRN

## 2019-01-17 MED ORDER — OXYCODONE HCL 5 MG PO TABS: 5.0000 mg | ORAL_TABLET | Freq: Once | ORAL | Status: DC | PRN

## 2019-01-17 MED ORDER — MEPIVACAINE HCL (PF) 2 % IJ SOLN
INTRAMUSCULAR | Status: AC
  Filled 2019-01-17: qty 20

## 2019-01-17 MED ORDER — MEPIVACAINE HCL (PF) 2 % IJ SOLN
INTRAMUSCULAR | Status: DC | PRN
  Administered 2019-01-17: 3.5 mL via INTRATHECAL

## 2019-01-17 MED ORDER — ONDANSETRON HCL 4 MG/2ML IJ SOLN
INTRAMUSCULAR | Status: AC
  Filled 2019-01-17: qty 6

## 2019-01-17 MED ORDER — METHOCARBAMOL 500 MG PO TABS
ORAL_TABLET | ORAL | Status: AC
  Filled 2019-01-17: qty 1

## 2019-01-17 MED ORDER — LACTATED RINGERS IV SOLN
INTRAVENOUS | Status: DC
  Administered 2019-01-17 (×3): via INTRAVENOUS

## 2019-01-17 MED ORDER — BUPIVACAINE LIPOSOME 1.3 % IJ SUSP
INTRAMUSCULAR | Status: DC | PRN
  Administered 2019-01-17: 20 mL

## 2019-01-17 MED ORDER — DEXAMETHASONE SODIUM PHOSPHATE 10 MG/ML IJ SOLN
INTRAMUSCULAR | Status: AC
  Filled 2019-01-17: qty 3

## 2019-01-17 MED ORDER — PROPOFOL 10 MG/ML IV BOLUS
INTRAVENOUS | Status: AC
  Filled 2019-01-17: qty 20

## 2019-01-17 MED ORDER — SODIUM CHLORIDE 0.9% FLUSH
INTRAVENOUS | Status: DC | PRN
  Administered 2019-01-17: 20 mL

## 2019-01-17 MED ORDER — WATER FOR IRRIGATION, STERILE IR SOLN
Status: DC | PRN
  Administered 2019-01-17: 2000 mL

## 2019-01-17 MED ORDER — BUPIVACAINE-EPINEPHRINE 0.25% -1:200000 IJ SOLN
INTRAMUSCULAR | Status: AC
  Filled 2019-01-17: qty 1

## 2019-01-17 MED ORDER — SODIUM CHLORIDE 0.9 % IV BOLUS: 500.0000 mL | Freq: Once | INTRAVENOUS | Status: DC

## 2019-01-17 MED ORDER — ROPIVACAINE HCL 7.5 MG/ML IJ SOLN
INTRAMUSCULAR | Status: DC | PRN
  Administered 2019-01-17: 30 mL via PERINEURAL

## 2019-01-17 MED ORDER — MIDAZOLAM HCL 2 MG/2ML IJ SOLN
1.0000 mg | INTRAMUSCULAR | Status: DC
  Administered 2019-01-17: 2 mg via INTRAVENOUS
  Filled 2019-01-17: qty 2

## 2019-01-17 MED ORDER — GABAPENTIN 300 MG PO CAPS: 300.0000 mg | ORAL_CAPSULE | Freq: Once | ORAL | Status: DC

## 2019-01-17 MED ORDER — POVIDONE-IODINE 10 % EX SWAB
2.0000 "application " | Freq: Once | CUTANEOUS | Status: AC
  Administered 2019-01-17: 2 via TOPICAL

## 2019-01-17 MED ORDER — FENTANYL CITRATE (PF) 100 MCG/2ML IJ SOLN
INTRAMUSCULAR | Status: AC
  Filled 2019-01-17: qty 2

## 2019-01-17 MED ORDER — ACETAMINOPHEN 500 MG PO TABS
1000.0000 mg | ORAL_TABLET | Freq: Once | ORAL | Status: AC
  Administered 2019-01-17: 1000 mg via ORAL
  Filled 2019-01-17: qty 2

## 2019-01-17 MED ORDER — OXYCODONE HCL 5 MG PO TABS: 5.0000 mg | ORAL_TABLET | Freq: Four times a day (QID) | ORAL | 0 refills | Status: DC | PRN

## 2019-01-17 MED ORDER — FENTANYL CITRATE (PF) 100 MCG/2ML IJ SOLN
INTRAMUSCULAR | Status: DC | PRN
  Administered 2019-01-17: 25 ug via INTRAVENOUS

## 2019-01-17 MED ORDER — ACETAMINOPHEN 160 MG/5ML PO SOLN: 325.0000 mg | ORAL | Status: DC | PRN

## 2019-01-17 MED ORDER — METHOCARBAMOL 500 MG PO TABS: 500.0000 mg | ORAL_TABLET | Freq: Four times a day (QID) | ORAL | 0 refills | Status: DC

## 2019-01-17 MED ORDER — BUPIVACAINE-EPINEPHRINE 0.25% -1:200000 IJ SOLN
INTRAMUSCULAR | Status: DC | PRN
  Administered 2019-01-17: 30 mL

## 2019-01-17 MED ORDER — TRANEXAMIC ACID-NACL 1000-0.7 MG/100ML-% IV SOLN
1000.0000 mg | INTRAVENOUS | Status: DC
  Filled 2019-01-17: qty 100

## 2019-01-17 MED ORDER — ASPIRIN EC 325 MG PO TBEC: 325.0000 mg | DELAYED_RELEASE_TABLET | Freq: Two times a day (BID) | ORAL | 0 refills | Status: AC

## 2019-01-17 MED ORDER — OXYCODONE HCL 5 MG/5ML PO SOLN: 5.0000 mg | Freq: Once | ORAL | Status: DC | PRN

## 2019-01-17 MED ORDER — GLYCOPYRROLATE PF 0.2 MG/ML IJ SOSY
PREFILLED_SYRINGE | INTRAMUSCULAR | Status: DC | PRN
  Administered 2019-01-17: .2 mg via INTRAVENOUS

## 2019-01-17 MED ORDER — METHOCARBAMOL 500 MG PO TABS
500.0000 mg | ORAL_TABLET | Freq: Four times a day (QID) | ORAL | Status: DC
  Administered 2019-01-17: 500 mg via ORAL

## 2019-01-17 MED ORDER — FENTANYL CITRATE (PF) 100 MCG/2ML IJ SOLN: 25.0000 ug | INTRAMUSCULAR | Status: DC | PRN

## 2019-01-17 MED ORDER — PROPOFOL 500 MG/50ML IV EMUL
INTRAVENOUS | Status: AC
  Filled 2019-01-17: qty 100

## 2019-01-17 MED ORDER — CELECOXIB 200 MG PO CAPS
400.0000 mg | ORAL_CAPSULE | Freq: Once | ORAL | Status: AC
  Administered 2019-01-17: 400 mg via ORAL
  Filled 2019-01-17: qty 2

## 2019-01-17 MED ORDER — SODIUM CHLORIDE 0.9 % IR SOLN
Status: DC | PRN
  Administered 2019-01-17: 2000 mL
  Administered 2019-01-17: 1000 mL

## 2019-01-17 MED ORDER — PROPOFOL 500 MG/50ML IV EMUL
INTRAVENOUS | Status: DC | PRN
  Administered 2019-01-17: 85 ug/kg/min via INTRAVENOUS

## 2019-01-17 MED ORDER — CHLORHEXIDINE GLUCONATE 4 % EX LIQD: 60.0000 mL | Freq: Once | CUTANEOUS | Status: DC

## 2019-01-17 MED ORDER — SODIUM CHLORIDE (PF) 0.9 % IJ SOLN
INTRAMUSCULAR | Status: AC
  Filled 2019-01-17: qty 20

## 2019-01-17 MED ORDER — ACETAMINOPHEN 325 MG PO TABS: 325.0000 mg | ORAL_TABLET | ORAL | Status: DC | PRN

## 2019-01-17 MED ORDER — CEFAZOLIN SODIUM-DEXTROSE 2-4 GM/100ML-% IV SOLN
2.0000 g | INTRAVENOUS | Status: AC
  Administered 2019-01-17: 08:00:00 2 g via INTRAVENOUS
  Filled 2019-01-17: qty 100

## 2019-01-17 MED ORDER — FENTANYL CITRATE (PF) 100 MCG/2ML IJ SOLN
50.0000 ug | INTRAMUSCULAR | Status: DC
  Administered 2019-01-17: 100 ug via INTRAVENOUS
  Filled 2019-01-17: qty 2

## 2019-01-17 MED ORDER — OXYCODONE HCL 5 MG PO TABS
5.0000 mg | ORAL_TABLET | Freq: Four times a day (QID) | ORAL | Status: DC | PRN
  Administered 2019-01-17: 10 mg via ORAL

## 2019-01-17 MED ORDER — OXYCODONE HCL 5 MG PO TABS
ORAL_TABLET | ORAL | Status: AC
  Filled 2019-01-17: qty 2

## 2019-01-17 MED ORDER — MIDAZOLAM HCL 2 MG/2ML IJ SOLN
INTRAMUSCULAR | Status: AC
  Filled 2019-01-17: qty 2

## 2019-01-17 MED ORDER — DEXAMETHASONE SODIUM PHOSPHATE 10 MG/ML IJ SOLN
INTRAMUSCULAR | Status: DC | PRN
  Administered 2019-01-17: 8 mg via INTRAVENOUS

## 2019-01-17 MED ORDER — DEXAMETHASONE SODIUM PHOSPHATE 10 MG/ML IJ SOLN: 8.0000 mg | Freq: Once | INTRAMUSCULAR | Status: DC

## 2019-01-17 MED ORDER — PHENYLEPHRINE HCL-NACL 10-0.9 MG/250ML-% IV SOLN
INTRAVENOUS | Status: DC | PRN
  Administered 2019-01-17: 10 ug/min via INTRAVENOUS

## 2019-01-17 MED ORDER — ONDANSETRON HCL 4 MG/2ML IJ SOLN: 4.0000 mg | Freq: Once | INTRAMUSCULAR | Status: DC | PRN

## 2019-01-17 SURGICAL SUPPLY — 65 items
ARTISURF 10M PLYL 10-11EF KNEE (Knees) ×3 IMPLANT
BAG ZIPLOCK 12X15 (MISCELLANEOUS) ×3 IMPLANT
BLADE SAGITTAL 13X1.27X60 (BLADE) ×2 IMPLANT
BLADE SAGITTAL 13X1.27X60MM (BLADE) ×1
BLADE SAW SGTL 83.5X18.5 (BLADE) ×3 IMPLANT
BLADE SURG 15 STRL LF DISP TIS (BLADE) ×1 IMPLANT
BLADE SURG 15 STRL SS (BLADE) ×2
BLADE SURG SZ10 CARB STEEL (BLADE) ×6 IMPLANT
BNDG COHESIVE 6X5 TAN STRL LF (GAUZE/BANDAGES/DRESSINGS) ×3 IMPLANT
BNDG ELASTIC 6X5.8 VLCR STR LF (GAUZE/BANDAGES/DRESSINGS) ×3 IMPLANT
BOWL SMART MIX CTS (DISPOSABLE) ×3 IMPLANT
CEMENT BONE SIMPLEX SPEEDSET (Cement) ×6 IMPLANT
CLOSURE WOUND 1/2 X4 (GAUZE/BANDAGES/DRESSINGS) ×2
COVER SURGICAL LIGHT HANDLE (MISCELLANEOUS) ×3 IMPLANT
COVER WAND RF STERILE (DRAPES) IMPLANT
CUFF TOURN SGL QUICK 34 (TOURNIQUET CUFF) ×2
CUFF TRNQT CYL 34X4.125X (TOURNIQUET CUFF) ×1 IMPLANT
DECANTER SPIKE VIAL GLASS SM (MISCELLANEOUS) ×9 IMPLANT
DRAPE INCISE IOBAN 66X45 STRL (DRAPES) ×6 IMPLANT
DRAPE U-SHAPE 47X51 STRL (DRAPES) ×3 IMPLANT
DRESSING AQUACEL AG SP 3.5X10 (GAUZE/BANDAGES/DRESSINGS) ×1 IMPLANT
DRSG AQUACEL AG ADV 3.5X10 (GAUZE/BANDAGES/DRESSINGS) ×3 IMPLANT
DRSG AQUACEL AG SP 3.5X10 (GAUZE/BANDAGES/DRESSINGS) ×3
DURAPREP 26ML APPLICATOR (WOUND CARE) ×6 IMPLANT
ELECT REM PT RETURN 15FT ADLT (MISCELLANEOUS) ×3 IMPLANT
FEMUR  CMT CCR STD SZ10 L KNEE (Knees) ×2 IMPLANT
FEMUR CMT CCR STD SZ10 L KNEE (Knees) ×1 IMPLANT
FEMUR CMTD CR PERS STD SZ 5 RT (Knees) ×1 IMPLANT
GLOVE BIOGEL M STRL SZ7.5 (GLOVE) ×3 IMPLANT
GLOVE BIOGEL PI IND STRL 7.5 (GLOVE) ×1 IMPLANT
GLOVE BIOGEL PI IND STRL 8.5 (GLOVE) ×2 IMPLANT
GLOVE BIOGEL PI INDICATOR 7.5 (GLOVE) ×2
GLOVE BIOGEL PI INDICATOR 8.5 (GLOVE) ×4
GLOVE SURG ORTHO 8.0 STRL STRW (GLOVE) ×9 IMPLANT
GOWN STRL REUS W/ TWL XL LVL3 (GOWN DISPOSABLE) ×2 IMPLANT
GOWN STRL REUS W/TWL XL LVL3 (GOWN DISPOSABLE) ×4
HANDPIECE INTERPULSE COAX TIP (DISPOSABLE) ×2
HDLS TROCR DRIL PIN KNEE 75 (PIN) ×2
HOLDER FOLEY CATH W/STRAP (MISCELLANEOUS) IMPLANT
HOOD PEEL AWAY FLYTE STAYCOOL (MISCELLANEOUS) ×12 IMPLANT
KIT TURNOVER KIT A (KITS) IMPLANT
MANIFOLD NEPTUNE II (INSTRUMENTS) ×3 IMPLANT
NEEDLE HYPO 22GX1.5 SAFETY (NEEDLE) ×3 IMPLANT
NS IRRIG 1000ML POUR BTL (IV SOLUTION) ×3 IMPLANT
PACK TOTAL KNEE CUSTOM (KITS) ×3 IMPLANT
PENCIL SMOKE EVACUATOR (MISCELLANEOUS) ×3 IMPLANT
PIN DRILL HDLS TROCAR 75 4PK (PIN) ×1 IMPLANT
PROTECTOR NERVE ULNAR (MISCELLANEOUS) ×3 IMPLANT
SET HNDPC FAN SPRY TIP SCT (DISPOSABLE) ×1 IMPLANT
STEM POLY PAT PLY 35M KNEE (Knees) ×3 IMPLANT
STEM TIBIA 5 DEG SZ F L KNEE (Knees) ×1 IMPLANT
STRIP CLOSURE SKIN 1/2X4 (GAUZE/BANDAGES/DRESSINGS) ×4 IMPLANT
SUT BONE WAX W31G (SUTURE) ×3 IMPLANT
SUT MNCRL AB 3-0 PS2 18 (SUTURE) ×3 IMPLANT
SUT STRATAFIX 0 PDS 27 VIOLET (SUTURE) ×3
SUT STRATAFIX PDS+ 0 24IN (SUTURE) ×3 IMPLANT
SUT VIC AB 1 CT1 36 (SUTURE) ×3 IMPLANT
SUTURE STRATFX 0 PDS 27 VIOLET (SUTURE) ×1 IMPLANT
SYR CONTROL 10ML LL (SYRINGE) ×6 IMPLANT
TIBIA STEM 5 DEG SZ F L KNEE (Knees) ×3 IMPLANT
TRAY CATH 16FR W/PLASTIC CATH (SET/KITS/TRAYS/PACK) ×3 IMPLANT
TRAY FOLEY MTR SLVR 16FR STAT (SET/KITS/TRAYS/PACK) IMPLANT
WATER STERILE IRR 1000ML POUR (IV SOLUTION) ×6 IMPLANT
WRAP KNEE MAXI GEL POST OP (GAUZE/BANDAGES/DRESSINGS) ×3 IMPLANT
YANKAUER SUCT BULB TIP 10FT TU (MISCELLANEOUS) ×3 IMPLANT

## 2019-01-17 NOTE — H&P (Signed)
Yonis Riehl MRN:  [id number] DOB/SEX:  17-Apr-1950/male  CHIEF COMPLAINT:  Painful left Knee  HISTORY: Patient is a 68 y.o. male presented with a history of pain in the left knee. Onset of symptoms was gradual starting a few years ago with gradually worsening course since that time. Patient has been treated conservatively with over-the-counter NSAIDs and activity modification. Patient currently rates pain in the knee at 10 out of 10 with activity. There is pain at night.  PAST MEDICAL HISTORY: Patient Active Problem List   Diagnosis Date Noted  . GERD (gastroesophageal reflux disease) 08/31/2018  . Allergic rhinitis 03/11/2018  . Alcohol use 03/11/2018  . Obesity (BMI 35.0-39.9 without comorbidity) 08/03/2017  . Environmental and seasonal allergies 08/22/2016  . Diverticulosis 08/22/2016  . BPH with obstruction/lower urinary tract symptoms 08/22/2016  . Pre-diabetes 08/22/2016  . Osteoarthritis of multiple joints 08/22/2016  . Degenerative joint disease (DJD) of lumbar spine 08/22/2016  . History of sciatica 08/22/2016  . Hyperlipidemia 08/22/2016   Past Medical History:  Diagnosis Date  . Allergy   . Arthritis   . Diverticulosis   . GERD (gastroesophageal reflux disease)   . Hyperlipidemia    Past Surgical History:  Procedure Laterality Date  . BACK SURGERY  01/17/2015   Dr [facility]), Multiple diskectomy and herniated disc  . VASECTOMY       MEDICATIONS:   Medications Prior to Admission  Medication Sig Dispense Refill Last Dose  . diclofenac sodium (VOLTAREN) 1 % GEL Apply 2 g topically 3 (three) times daily as needed. Use for hands and knee arthritis, joint pain (Patient taking differently: Apply 2 g topically 3 (three) times daily as needed (shoulder pain/hands & knee pain.). ) 100 g 2 Past Month at Unknown time  . famotidine (PEPCID) 20 MG tablet Take 1 tablet (20 mg total) by mouth 2 (two) times daily as needed for heartburn or indigestion. (Patient  taking differently: Take 20 mg by mouth 2 (two) times daily. ) 180 tablet 1 01/16/2019 at Unknown time  . loratadine (CLARITIN) 10 MG tablet Take 10 mg by mouth 2 (two) times daily.    01/16/2019 at Unknown time  . Magnesium 100 MG CAPS Take by mouth.   Past Month at Unknown time  . Magnesium 500 MG TABS Take 500 mg by mouth at bedtime.   Past Month at Unknown time  . Multiple Vitamin (MULTIVITAMIN WITH MINERALS) TABS tablet Take 1 tablet by mouth at bedtime.   Past Month at Unknown time  . Omega-3 Fatty Acids (FISH OIL) 1200 MG CAPS Take 1,200 mg by mouth 2 (two) times daily.    Past Month at Unknown time  . Turmeric 500 MG CAPS Take 500 mg by mouth every evening.   Past Month at Unknown time  . aspirin EC 81 MG tablet Take 81 mg by mouth at bedtime.    01/03/2019  . finasteride (PROSCAR) 5 MG tablet Take 1 tablet (5 mg total) by mouth daily. 90 tablet 0 01/14/2019  . fluticasone (FLONASE) 50 MCG/ACT nasal spray INHALE 2 SPRAYS IN EACH NOSTRIL ONCE IN THE MORNING (Patient taking differently: Place 2 sprays into both nostrils daily at 2 PM. ) 16 g 10 01/13/2019  . ibuprofen (ADVIL) 200 MG tablet Take 600 mg by mouth every evening.   01/14/2019  . ibuprofen (ADVIL) 800 MG tablet Take 1 tablet (800 mg total) by mouth every 8 (eight) hours as needed for mild pain or moderate pain (arthritis). (Patient taking differently:  Take 800 mg by mouth daily. ) 120 tablet 1 01/14/2019  . simvastatin (ZOCOR) 40 MG tablet TAKE ONE TABLET AT BEDTIME (Patient taking differently: Take 40 mg by mouth at bedtime. ) 90 tablet 3 01/14/2019    ALLERGIES:   Allergies  Allergen Reactions  . Metronidazole Hives  . Tetanus Toxoid Adsorbed Hives    REVIEW OF SYSTEMS:  A comprehensive review of systems was negative except for: Musculoskeletal: positive for arthralgias and bone pain   FAMILY HISTORY:   Family History  Problem Relation Age of Onset  . Heart disease Mother   . Alzheimer's disease Mother   . Heart attack  Mother   . Prostate cancer Father 50  . Heart disease Brother 51       CABG  . Heart disease Brother        CABG    SOCIAL HISTORY:   Social History   Tobacco Use  . Smoking status: Former Smoker    Packs/day: 1.00    Types: Cigarettes    Quit date: 1994    Years since quitting: 26.9  . Smokeless tobacco: Never Used  Substance Use Topics  . Alcohol use: Yes    Alcohol/week: 24.0 standard drinks    Types: 24 Cans of beer per week     EXAMINATION:  Vital signs in last 24 hours: Temp:  [99.1 F (37.3 C)] 99.1 F (37.3 C) (12/07 0612) Pulse Rate:  [68] 68 (12/07 0612) Resp:  [20] 20 (12/07 0612) BP: (148)/(102) 148/102 (12/07 0612) SpO2:  [95 %] 95 % (12/07 0612) Weight:  [110.3 kg] 110.3 kg (12/07 0613)  BP (!) 148/102   Pulse 68   Temp 99.1 F (37.3 C) (Oral)   Resp 20   Ht 5\' 11"  (1.803 m)   Wt 110.3 kg   SpO2 95%   BMI 33.92 kg/m   General Appearance:    Alert, cooperative, no distress, appears stated age  Head:    Normocephalic, without obvious abnormality, atraumatic  Eyes:    PERRL, conjunctiva/corneas clear, EOM's intact, fundi    benign, both eyes       Ears:    Normal TM's and external ear canals, both ears  Nose:   Nares normal, septum midline, mucosa normal, no drainage    or sinus tenderness  Throat:   Lips, mucosa, and tongue normal; teeth and gums normal  Neck:   Supple, symmetrical, trachea midline, no adenopathy;       thyroid:  No enlargement/tenderness/nodules; no carotid   bruit or JVD  Back:     Symmetric, no curvature, ROM normal, no CVA tenderness  Lungs:     Clear to auscultation bilaterally, respirations unlabored  Chest wall:    No tenderness or deformity  Heart:    Regular rate and rhythm, S1 and S2 normal, no murmur, rub   or gallop  Abdomen:     Soft, non-tender, bowel sounds active all four quadrants,    no masses, no organomegaly  Genitalia:    Normal male without lesion, discharge or tenderness  Rectal:    Normal tone, normal  prostate, no masses or tenderness;   guaiac negative stool  Extremities:   Extremities normal, atraumatic, no cyanosis or edema  Pulses:   2+ and symmetric all extremities  Skin:   Skin color, texture, turgor normal, no rashes or lesions  Lymph nodes:   Cervical, supraclavicular, and axillary nodes normal  Neurologic:   CNII-XII intact. Normal strength, sensation and reflexes  throughout    Musculoskeletal:  ROM 0-120, Ligaments intact,  Imaging Review Plain radiographs demonstrate severe degenerative joint disease of the left knee. The overall alignment is neutral. The bone quality appears to be good for age and reported activity level.  Assessment/Plan: Primary osteoarthritis, left knee   The patient history, physical examination and imaging studies are consistent with advanced degenerative joint disease of the left knee. The patient has failed conservative treatment.  The clearance notes were reviewed.  After discussion with the patient it was felt that Total Knee Replacement was indicated. The procedure,  risks, and benefits of total knee arthroplasty were presented and reviewed. The risks including but not limited to aseptic loosening, infection, blood clots, vascular injury, stiffness, patella tracking problems complications among others were discussed. The patient acknowledged the explanation, agreed to proceed with the plan.  Preoperative templating of the joint replacement has been completed, documented, and submitted to the Operating Room personnel in order to optimize intra-operative equipment management.    Patient's anticipated LOS is less than 2 midnights, meeting these requirements: - Lives within 1 hour of care - Has a competent adult at home to recover with post-op recover - NO history of  - Chronic pain requiring opiods  - Diabetes  - Coronary Artery Disease  - Heart failure  - Heart attack  - Stroke  - DVT/VTE  - Cardiac arrhythmia  - Respiratory  Failure/COPD  - Renal failure  - Anemia  - Advanced Liver disease       Donia Ast 01/17/2019, 6:19 AM

## 2019-01-17 NOTE — Anesthesia Procedure Notes (Signed)
Anesthesia Regional Block: Adductor canal block   Pre-Anesthetic Checklist: ,, timeout performed, Correct Patient, Correct Site, Correct Laterality, Correct Procedure, Correct Position, site marked, Risks and benefits discussed,  Surgical consent,  Pre-op evaluation,  At surgeon's request and post-op pain management  Laterality: Left  Prep: chloraprep       Needles:  Injection technique: Single-shot  Needle Type: Echogenic Stimulator Needle     Needle Length: 5cm  Needle Gauge: 22     Additional Needles:   Procedures:, nerve stimulator,,, ultrasound used (permanent image in chart),,,,  Narrative:  Start time: 01/17/2019 7:57 AM End time: 01/17/2019 8:05 AM Injection made incrementally with aspirations every 5 mL.  Performed by: Personally  Anesthesiologist: Janeece Riggers, MD  Additional Notes: Functioning IV was confirmed and monitors were applied.  A 5mm 22ga Arrow echogenic stimulator needle was used. Sterile prep and drape,hand hygiene and sterile gloves were used. Ultrasound guidance: relevant anatomy identified, needle position confirmed, local anesthetic spread visualized around nerve(s)., vascular puncture avoided.  Image printed for medical record. Negative aspiration and negative test dose prior to incremental administration of local anesthetic. The patient tolerated the procedure well.

## 2019-01-17 NOTE — Progress Notes (Signed)
Assisted Dr. Oddono with left, ultrasound guided, adductor canal block. Side rails up, monitors on throughout procedure. See vital signs in flow sheet. Tolerated Procedure well.  

## 2019-01-17 NOTE — Anesthesia Postprocedure Evaluation (Signed)
Anesthesia Post Note  Patient: Antavious Skellie  Procedure(s) Performed: TOTAL KNEE ARTHROPLASTY (Left Knee)     Patient location during evaluation: PACU Anesthesia Type: Spinal Level of consciousness: oriented and awake and alert Pain management: pain level controlled Vital Signs Assessment: post-procedure vital signs reviewed and stable Respiratory status: spontaneous breathing, respiratory function stable and patient connected to nasal cannula oxygen Cardiovascular status: blood pressure returned to baseline and stable Postop Assessment: no headache, no backache and no apparent nausea or vomiting Anesthetic complications: no    Last Vitals:  Vitals:   01/17/19 1230 01/17/19 1244  BP:  137/82  Pulse: 73 65  Resp: 16 18  Temp: 36.8 C 36.5 C  SpO2: 94% 95%    Last Pain:  Vitals:   01/17/19 1320  TempSrc:   PainSc: 7                  Raylynne Cubbage

## 2019-01-17 NOTE — Evaluation (Signed)
Physical Therapy Evaluation Patient Details Name: Vincent Guerra MRN: [id number] DOB: 07-10-1950 Today's Date: 01/17/2019   History of Present Illness  Patient is 68 y.o. male s/p Lt TKA on 01/17/19 with PMH significant for HLD, GERD, OA.  Clinical Impression  Vincent Guerra is a 68 y.o. male POD 0 s/p Lt TKA. Patient reports independence with mobility at baseline. Patient is now limited by functional impairments (see PT problem list below) and requires supervision to min guard for transfers and gait with RW. Patient was able to ambulate ~80 feet with RW and min guard and cues for safe walker management. Patient educated on safe sequencing for stair mobility and verbalized safe guarding position for people assisting with mobility. Patient instructed in exercises to facilitate ROM and circulation. Patient will benefit from continued skilled PT interventions to address impairments and progress towards PLOF. Patient has met mobility goals at adequate level for discharge home; will continue to follow if pt continues acute stay to progress towards Mod I goals.     Follow Up Recommendations Follow surgeon's recommendation for DC plan and follow-up therapies    Equipment Recommendations  None recommended by PT    Recommendations for Other Services       Precautions / Restrictions        Mobility  Bed Mobility Overal bed mobility: Needs Assistance Bed Mobility: Supine to Sit     Supine to sit: HOB elevated;Supervision     General bed mobility comments: no assist required and no cues needed  Transfers Overall transfer level: Needs assistance Equipment used: Rolling walker (2 wheeled) Transfers: Sit to/from Stand Sit to Stand: Supervision;Min guard         General transfer comment: cues for hand placement and technique with RW; min guard provided for safety  Ambulation/Gait Ambulation/Gait assistance: Min guard Gait Distance (Feet): 80 Feet Assistive device: Rolling  walker (2 wheeled) Gait Pattern/deviations: Step-through pattern;Decreased step length - left;Decreased step length - right;Decreased stride length;Decreased stance time - left;Decreased weight shift to left Gait velocity: decreased   General Gait Details: cues for safe step pattern initially, no buckling noted at Lt knee. verbal cues to sequence turn and remain in safe proximity of RW.  Stairs Stairs: Yes Stairs assistance: Min guard Stair Management: Forwards;One rail Left;With cane Number of Stairs: 5 General stair comments: verbal cues for safe step sequencing "up with good, down with bad" and for placement of SPC. verbal cues for safe guarding position of person assisting and had pt teach back to demonstrate understanding.  Wheelchair Mobility    Modified Rankin (Stroke Patients Only)       Balance Overall balance assessment: Needs assistance Sitting-balance support: Feet supported Sitting balance-Leahy Scale: Good     Standing balance support: During functional activity;Bilateral upper extremity supported Standing balance-Leahy Scale: Fair Standing balance comment: pt stood for ~ 4 minutes attempting to void bladder, unsuccessful          Pertinent Vitals/Pain Pain Assessment: 0-10 Pain Score: 4  Pain Location: Lt TKA Pain Descriptors / Indicators: Aching;Sore Pain Intervention(s): Limited activity within patient's tolerance;Monitored during session    [place] expects to be discharged to:: Private residence Living Arrangements: Spouse/significant other Available Help at Discharge: Family(pt's wife and daughter can assist daughtre lives 1 mile away)) Type of Home: House Home Access: Stairs to enter Entrance Stairs-Rails: Psychiatric nurse of Steps: 4 at front with bil hand rails, can reach one; or 5 from garage with [place]:  One level Home Equipment: [place] - 2 wheels;Cane - single point;Shower seat;Shower seat  - built in;Grab bars - tub/shower Additional Comments: pt has walk in shower with built in seat and tub/shower wtih portable shower seat in it. he has grab bars in the walk in shower.    Prior Function Level of Independence: Independent               Hand Dominance   Dominant Hand: Right    Extremity/Trunk Assessment   Upper Extremity Assessment Upper Extremity Assessment: Overall WFL for tasks assessed    Lower Extremity Assessment Lower Extremity Assessment: LLE deficits/detail LLE Deficits / Details: no extensor lag with SLR, 4/5 for quad strength with MMT LLE Sensation: WNL LLE Coordination: WNL    Cervical / Trunk Assessment Cervical / Trunk Assessment: Normal  Communication   Communication: No difficulties  Cognition Arousal/Alertness: Awake/alert Behavior During Therapy: WFL for tasks assessed/performed Overall Cognitive Status: Within Functional Limits for tasks assessed         General Comments      Exercises Total Joint Exercises Ankle Circles/Pumps: AROM;10 reps;Seated;Both Quad Sets: AROM;Seated;5 reps;Left Short Arc Quad: AROM;5 reps;Seated;Left Hip ABduction/ADduction: AROM;5 reps;Seated;Left Straight Leg Raises: Supine;5 reps;AROM;Left Long Arc Quad: AROM;5 reps;Seated;Left Knee Flexion: AROM;5 reps;AAROM;Seated;Left   Assessment/Plan    PT Assessment Patient needs continued PT services  PT Problem List Decreased strength;Decreased balance;Decreased range of motion;Decreased mobility;Decreased activity tolerance;Decreased knowledge of use of DME       PT Treatment Interventions Functional mobility training;DME instruction;Balance training;Patient/family education;Gait training;Therapeutic activities;Therapeutic exercise;Stair training;Modalities    PT Goals (Current goals can be found in the Care Plan section)  Acute Rehab PT Goals Patient Stated Goal: improve walking and go home PT Goal Formulation: With patient Time For Goal  Achievement: 01/24/19 Potential to Achieve Goals: Good    Frequency 7X/week    AM-PAC PT "6 Clicks" Mobility  Outcome Measure Help needed turning from your back to your side while in a flat bed without using bedrails?: A Little Help needed moving from lying on your back to sitting on the side of a flat bed without using bedrails?: A Little Help needed moving to and from a bed to a chair (including a wheelchair)?: A Little Help needed standing up from a chair using your arms (e.g., wheelchair or bedside chair)?: A Little Help needed to walk in hospital room?: A Little Help needed climbing 3-5 steps with a railing? : A Little 6 Click Score: 18    End of Session Equipment Utilized During Treatment: Gait belt Activity Tolerance: Patient tolerated treatment well Patient left: with call bell/phone within reach;in chair Nurse Communication: Mobility status PT Visit Diagnosis: Muscle weakness (generalized) (M62.81);Difficulty in walking, not elsewhere classified (R26.2)    Time: 9794-8016 PT Time Calculation (min) (ACUTE ONLY): 47 min   Charges:   PT Evaluation $PT Eval Low Complexity: 1 Low PT Treatments $Gait Training: 8-22 mins $Therapeutic Exercise: 8-22 mins       Kipp Brood, PT, DPT Physical Therapist with [facility]  01/17/2019 4:02 PM

## 2019-01-17 NOTE — Op Note (Signed)
TOTAL KNEE REPLACEMENT OPERATIVE NOTE:  01/17/2019  1:14 PM  PATIENT:  Vincent Guerra  68 y.o. male  PRE-OPERATIVE DIAGNOSIS:  Primary Osteoarthritis Left Knee  POST-OPERATIVE DIAGNOSIS:  Primary Osteoarthritis Left Knee  PROCEDURE:  Procedure(s): TOTAL KNEE ARTHROPLASTY  SURGEON:  Surgeon(s): Vickey Huger, MD  PHYSICIAN ASSISTANT: Nehemiah Massed, PA-C  ANESTHESIA:   general  SPECIMEN: None  COUNTS:  Correct  TOURNIQUET:   Total Tourniquet Time Documented: Thigh (Left) - 46 minutes Total: Thigh (Left) - 46 minutes   DICTATION:  Indication for procedure:    The patient is a 68 y.o. male who has failed conservative treatment for Primary Osteoarthritis Left Knee.  Informed consent was obtained prior to anesthesia. The risks versus benefits of the operation were explain and in a way the patient can, and did, understand.     Description of procedure:     The patient was taken to the operating room and placed under anesthesia.  The patient was positioned in the usual fashion taking care that all body parts were adequately padded and/or protected.  A tourniquet was applied and the leg prepped and draped in the usual sterile fashion.  The extremity was exsanguinated with the esmarch and tourniquet inflated to 350 mmHg.  Pre-operative range of motion was normal.    A midline incision approximately 6-7 inches long was made with a #10 blade.  A new blade was used to make a parapatellar arthrotomy going 2-3 cm into the quadriceps tendon, over the patella, and alongside the medial aspect of the patellar tendon.  A synovectomy was then performed with the #10 blade and forceps. I then elevated the deep MCL off the medial tibial metaphysis subperiosteally around to the semimembranosus attachment.    I everted the patella and used calipers to measure patellar thickness.  I used the reamer to ream down to appropriate thickness to recreate the native thickness.  I then removed excess  bone with the rongeur and sagittal saw.  I used the appropriately sized template and drilled the three lug holes.  I then put the trial in place and measured the thickness with the calipers to ensure recreation of the native thickness.  The trial was then removed and the patella subluxed and the knee brought into flexion.  A homan retractor was place to retract and protect the patella and lateral structures.  A Z-retractor was place medially to protect the medial structures.  The extra-medullary alignment system was used to make cut the tibial articular surface perpendicular to the anamotic axis of the tibia and in 3 degrees of posterior slope.  The cut surface and alignment jig was removed.  I then used the intramedullary alignment guide to make a  valgus cut on the distal femur.  I then marked out the epicondylar axis on the distal femur.   I then used the anterior referencing sizer and measured the femur to be a size 10.  The 4-In-1 cutting block was screwed into place in external rotation matching the posterior condylar angle, making our cuts perpendicular to the epicondylar axis.  Anterior, posterior and chamfer cuts were made with the sagittal saw.  The cutting block and cut pieces were removed.  A lamina spreader was placed in 90 degrees of flexion.  The ACL, PCL, menisci, and posterior condylar osteophytes were removed.  A 10 mm spacer blocked was found to offer good flexion and extension gap balance after minimal in degree releasing.   The scoop retractor was then placed and  the femoral finishing block was pinned in place.  The small sagittal saw was used as well as the lug drill to finish the femur.  The block and cut surfaces were removed and the medullary canal hole filled with autograft bone from the cut pieces.  The tibia was delivered forward in deep flexion and external rotation.  A size F tray was selected and pinned into place centered on the medial 1/3 of the tibial tubercle.  The reamer  and keel was used to prepare the tibia through the tray.    I then trialed with the size 10 femur, size F tibia, a 10 mm insert and the 35 patella.  I had excellent flexion/extension gap balance, excellent patella tracking.  Flexion was full and beyond 120 degrees; extension was zero.  These components were chosen and the staff opened them to me on the back table while the knee was lavaged copiously and the cement mixed.  The soft tissue was infiltrated with 60cc of exparel 1.3% through a 21 gauge needle.  I cemented in the components and removed all excess cement.  The polyethylene tibial component was snapped into place and the knee placed in extension while cement was hardening.  The capsule was infilltrated with a 60cc exparel/marcaine/saline mixture.   Once the cement was hard, the tourniquet was let down.  Hemostasis was obtained.  The arthrotomy was closed using a #1 stratofix running suture.  The deep soft tissues were closed with #0 vicryls and the subcuticular layer closed with #2-0 vicryl.  The skin was reapproximated and closed with 3.0 Monocryl.  The wound was covered with steristrips, aquacel dressing, and a TED stocking.   The patient was then awakened, extubated, and taken to the recovery room in stable condition.  BLOOD LOSS:  0000000 COMPLICATIONS:  None.  PLAN OF CARE: Discharge to home after PACU  PATIENT DISPOSITION:  PACU - hemodynamically stable.    Please fax a copy of this op note to my office at [phone] (please only include page 1 and 2 of the Case Information op note)

## 2019-01-17 NOTE — Anesthesia Procedure Notes (Signed)
Spinal  Patient location during procedure: OR Start time: 01/17/2019 8:00 AM End time: 01/17/2019 8:05 AM Staffing Anesthesiologist: Janeece Riggers, MD Preanesthetic Checklist Completed: patient identified, site marked, surgical consent, pre-op evaluation, timeout performed, IV checked, risks and benefits discussed and monitors and equipment checked Spinal Block Patient position: sitting Prep: DuraPrep Patient monitoring: heart rate, cardiac monitor, continuous pulse ox and blood pressure Approach: midline Location: L4-5 Injection technique: single-shot Needle Needle type: Sprotte  Needle gauge: 24 G Needle length: 9 cm Assessment Sensory level: T4

## 2019-01-17 NOTE — Transfer of Care (Signed)
Immediate Anesthesia Transfer of Care Note  Patient: Vincent Guerra  Procedure(s) Performed: Procedure(s) with comments: TOTAL KNEE ARTHROPLASTY (Left) - 75 mins needed for length of case. same day discharge  Patient Location: PACU  Anesthesia Type:Spinal  Level of Consciousness:  sedated, patient cooperative and responds to stimulation  Airway & Oxygen Therapy:Patient Spontanous Breathing and Patient connected to face mask oxgen  Post-op Assessment:  Report given to PACU RN and Post -op Vital signs reviewed and stable  Post vital signs:  Reviewed and stable  Last Vitals:  Vitals:   01/17/19 0801 01/17/19 0802  BP: 122/87   Pulse: 64 (!) 58  Resp: 15   Temp:    SpO2: 0000000 0000000    Complications: No apparent anesthesia complications

## 2019-01-18 ENCOUNTER — Encounter (HOSPITAL_COMMUNITY): Payer: Self-pay | Admitting: Orthopedic Surgery

## 2019-02-14 DIAGNOSIS — M25562 Pain in left knee: Secondary | ICD-10-CM | POA: Diagnosis not present

## 2019-02-16 DIAGNOSIS — M25562 Pain in left knee: Secondary | ICD-10-CM | POA: Diagnosis not present

## 2019-02-21 DIAGNOSIS — M25562 Pain in left knee: Secondary | ICD-10-CM | POA: Diagnosis not present

## 2019-02-23 DIAGNOSIS — M25562 Pain in left knee: Secondary | ICD-10-CM | POA: Diagnosis not present

## 2019-02-28 DIAGNOSIS — M25562 Pain in left knee: Secondary | ICD-10-CM | POA: Diagnosis not present

## 2019-03-02 DIAGNOSIS — M25562 Pain in left knee: Secondary | ICD-10-CM | POA: Diagnosis not present

## 2019-03-04 ENCOUNTER — Encounter: Payer: Self-pay | Admitting: Family Medicine

## 2019-03-04 ENCOUNTER — Ambulatory Visit (INDEPENDENT_AMBULATORY_CARE_PROVIDER_SITE_OTHER): Payer: Medicare Other | Admitting: Family Medicine

## 2019-03-04 ENCOUNTER — Other Ambulatory Visit: Payer: Self-pay

## 2019-03-04 VITALS — BP 116/70 | HR 85 | Temp 96.8°F | Resp 16 | Ht 71.0 in | Wt 235.6 lb

## 2019-03-04 DIAGNOSIS — M47816 Spondylosis without myelopathy or radiculopathy, lumbar region: Secondary | ICD-10-CM | POA: Diagnosis not present

## 2019-03-04 DIAGNOSIS — E669 Obesity, unspecified: Secondary | ICD-10-CM

## 2019-03-04 DIAGNOSIS — R7303 Prediabetes: Secondary | ICD-10-CM | POA: Diagnosis not present

## 2019-03-04 DIAGNOSIS — N401 Enlarged prostate with lower urinary tract symptoms: Secondary | ICD-10-CM | POA: Diagnosis not present

## 2019-03-04 DIAGNOSIS — M8949 Other hypertrophic osteoarthropathy, multiple sites: Secondary | ICD-10-CM | POA: Diagnosis not present

## 2019-03-04 DIAGNOSIS — M79672 Pain in left foot: Secondary | ICD-10-CM

## 2019-03-04 DIAGNOSIS — N138 Other obstructive and reflux uropathy: Secondary | ICD-10-CM

## 2019-03-04 DIAGNOSIS — M79675 Pain in left toe(s): Secondary | ICD-10-CM | POA: Diagnosis not present

## 2019-03-04 DIAGNOSIS — E782 Mixed hyperlipidemia: Secondary | ICD-10-CM

## 2019-03-04 DIAGNOSIS — M159 Polyosteoarthritis, unspecified: Secondary | ICD-10-CM

## 2019-03-04 DIAGNOSIS — K219 Gastro-esophageal reflux disease without esophagitis: Secondary | ICD-10-CM

## 2019-03-04 DIAGNOSIS — R1904 Left lower quadrant abdominal swelling, mass and lump: Secondary | ICD-10-CM

## 2019-03-04 NOTE — Progress Notes (Signed)
Name: Vincent Guerra   MRN: [id number]    DOB: 02-20-1950   Date:03/04/2019       Progress Note  Subjective  Chief Complaint  Chief Complaint  Patient presents with  . Follow-up  . Hyperlipidemia    HPI  LEFT abdominal soft tissue mass: December 10 he fell walking to the bathroom after having knee surgery and using a walker.  He hit the left lower abdomen with his walker and has had a lump on the abdomen since then.  HE would like evaluation of this.  Has occasional "raw" pain if he rubs the area, but does not have any constant pain; did not bruise or become red initially. Has stayed about the same size since the injury.  Hyperlipidemia Patient rx simvatatin & fish oils daily, and ASA 81mg  daily. Takes medications as prescribed with no missed doses a month. Denies myalgias, chest pain.  Pre-diabetes/Obesity Diet has been very good lately - getting plenty of fruits and vegetables. Has been working on weight loss- has smaller portions, cutting down on carbs. Denies polyuria, polydipsia, or polyphagia. Most recent A1C was at goal.  BPH  Takes finasteride 5mg  daily. Wakes up 1-2 times at night to pee. States on medication he doesn't dribble anymore and can feel complete bladder emptying.  No changes.   Spondylosis of lumbar region and left knee pain/OA Takes ibuprofen 800mg  in the morning and 600mg  in the evenings. States he cannot function when he does not take it - discussed monitoring kidney function and caution with chronic NSAID use.  Had LEFT TKR done in December and is recovering well from this - still in PT.  He sees orthopedic- Dr. Ronnie Derby with [facility] for knee and shoulder No longer seeing Dr. Harl Bowie (back surgeon).  Gastroesophageal reflux disease without esophagitis Takes famotidine BID every day. No concerns today.  Np blood in stool, dark and tarry stools, difficulty swallowing, or regurgitation.  Patient Active Problem List   Diagnosis Date Noted  . GERD  (gastroesophageal reflux disease) 08/31/2018  . Allergic rhinitis 03/11/2018  . Alcohol use 03/11/2018  . Obesity (BMI 35.0-39.9 without comorbidity) 08/03/2017  . Environmental and seasonal allergies 08/22/2016  . Diverticulosis 08/22/2016  . BPH with obstruction/lower urinary tract symptoms 08/22/2016  . Pre-diabetes 08/22/2016  . Osteoarthritis of multiple joints 08/22/2016  . Degenerative joint disease (DJD) of lumbar spine 08/22/2016  . History of sciatica 08/22/2016  . Hyperlipidemia 08/22/2016    Past Surgical History:  Procedure Laterality Date  . BACK SURGERY  01/17/2015   Dr Harl Bowie [facility]), Multiple diskectomy and herniated disc  . TOTAL KNEE ARTHROPLASTY Left 01/17/2019   Procedure: TOTAL KNEE ARTHROPLASTY;  Surgeon: Vickey Huger, MD;  Location: [facility];  Service: Orthopedics;  Laterality: Left;  75 mins needed for length of case. same day discharge  . VASECTOMY      Family History  Problem Relation Age of Onset  . Heart disease Mother   . Alzheimer's disease Mother   . Heart attack Mother   . Prostate cancer Father 56  . Heart disease Brother 34       CABG  . Heart disease Brother        CABG    Social History   Socioeconomic History  . Marital status: Married    Spouse name: Blanch Media  . Number of children: 2  . Years of education: [employer]  . Highest education level: High school graduate  Occupational History  . Occupation: Retired from  [employer] ([employer])  Tobacco Use  . Smoking status: Former Smoker    Packs/day: 1.00    Types: Cigarettes    Quit date: 1994    Years since quitting: 27.0  . Smokeless tobacco: Never Used  Substance and Sexual Activity  . Alcohol use: Yes    Alcohol/week: 24.0 standard drinks    Types: 24 Cans of beer per week  . Drug use: No  . Sexual activity: Not on file  Other Topics Concern  . Not on file  Social History Narrative  . Not on file   Social Determinants of Health   Financial  Resource Strain: Low Risk   . Difficulty of Paying Living Expenses: Not hard at all  Food Insecurity: No Food Insecurity  . Worried About Charity fundraiser in the Last Year: Never true  . Ran Out of Food in the Last Year: Never true  Transportation Needs: No Transportation Needs  . Lack of Transportation (Medical): No  . Lack of Transportation (Non-Medical): No  Physical Activity: Inactive  . Days of Exercise per Week: 0 days  . Minutes of Exercise per Session: 0 min  Stress: No Stress Concern Present  . Feeling of Stress : Not at all  Social Connections: Somewhat Isolated  . Frequency of Communication with Friends and Family: More than three times a week  . Frequency of Social Gatherings with Friends and Family: Twice a week  . Attends Religious Services: Never  . Active Member of Clubs or Organizations: No  . Attends Archivist Meetings: Never  . Marital Status: Married  Human resources officer Violence: Not At Risk  . Fear of Current or Ex-Partner: No  . Emotionally Abused: No  . Physically Abused: No  . Sexually Abused: No     Current Outpatient Medications:  .  diclofenac sodium (VOLTAREN) 1 % GEL, Apply 2 g topically 3 (three) times daily as needed. Use for hands and knee arthritis, joint pain (Patient taking differently: Apply 2 g topically 3 (three) times daily as needed (shoulder pain/hands & knee pain.). ), Disp: 100 g, Rfl: 2 .  famotidine (PEPCID) 20 MG tablet, Take 1 tablet (20 mg total) by mouth 2 (two) times daily as needed for heartburn or indigestion. (Patient taking differently: Take 20 mg by mouth 2 (two) times daily. ), Disp: 180 tablet, Rfl: 1 .  finasteride (PROSCAR) 5 MG tablet, Take 1 tablet (5 mg total) by mouth daily., Disp: 90 tablet, Rfl: 0 .  fluticasone (FLONASE) 50 MCG/ACT nasal spray, INHALE 2 SPRAYS IN EACH NOSTRIL ONCE IN THE MORNING (Patient taking differently: Place 2 sprays into both nostrils daily at 2 PM. ), Disp: 16 g, Rfl: 10 .   loratadine (CLARITIN) 10 MG tablet, Take 10 mg by mouth 2 (two) times daily. , Disp: , Rfl:  .  Magnesium 500 MG TABS, Take 500 mg by mouth at bedtime., Disp: , Rfl:  .  Multiple Vitamin (MULTIVITAMIN WITH MINERALS) TABS tablet, Take 1 tablet by mouth at bedtime., Disp: , Rfl:  .  Omega-3 Fatty Acids (FISH OIL) 1200 MG CAPS, Take 1,200 mg by mouth 2 (two) times daily. , Disp: , Rfl:  .  simvastatin (ZOCOR) 40 MG tablet, TAKE ONE TABLET AT BEDTIME (Patient taking differently: Take 40 mg by mouth at bedtime. ), Disp: 90 tablet, Rfl: 3 .  Turmeric 500 MG CAPS, Take 500 mg by mouth every evening., Disp: , Rfl:  .  Magnesium 100 MG CAPS, Take  by mouth., Disp: , Rfl:  .  methocarbamol (ROBAXIN) 500 MG tablet, Take 1-2 tablets (500-1,000 mg total) by mouth 4 (four) times daily. (Patient not taking: Reported on 03/04/2019), Disp: 60 tablet, Rfl: 0 .  oxyCODONE (OXY IR/ROXICODONE) 5 MG immediate release tablet, Take 1-2 tablets (5-10 mg total) by mouth every 6 (six) hours as needed for severe pain. (Patient not taking: Reported on 03/04/2019), Disp: 50 tablet, Rfl: 0  Allergies  Allergen Reactions  . Metronidazole Hives  . Tetanus Toxoid Adsorbed Hives    I personally reviewed active problem list, medication list, allergies, health maintenance, notes from last encounter, lab results with the patient/caregiver today.   ROS  Ten systems reviewed and is negative except as mentioned in HPI  Objective  Vitals:   03/04/19 0826  BP: 116/70  Pulse: 85  Resp: 16  Temp: (!) 96.8 F (36 C)  TempSrc: Temporal  SpO2: 95%  Weight: 235 lb 9.6 oz (106.9 kg)  Height: 5\' 11"  (1.803 m)   Body mass index is 32.86 kg/m.  Physical Exam  Constitutional: Patient appears well-developed and well-nourished. No distress.  HENT: Head: Normocephalic and atraumatic. Ears: bilateral TMs with no erythema or effusion; Nose: Nose normal. Mouth/Throat: Oropharynx is clear and moist. No oropharyngeal exudate or tonsillar  swelling.  Eyes: Conjunctivae and EOM are normal. No scleral icterus.  Pupils are equal, round, and reactive to light.  Neck: Normal range of motion. Neck supple. No JVD present. No thyromegaly present.  Cardiovascular: Normal rate, regular rhythm and normal heart sounds.  No murmur heard. No BLE edema. Pulmonary/Chest: Effort normal and breath sounds normal. No respiratory distress. Abdominal: Soft. Bowel sounds are normal, no distension. There is no tenderness.  There is a palpable, soft, non-tender mass to the left lower abdomen that is noticeable asymmetric from the RLQ Musculoskeletal: Normal range of motion, no joint effusions. No gross deformities.  There is tenderness to the LEFT medial distal foot and great toe, pain with lateral/medial movement, has limited flexion/extension.  No edema, minimal erythema. Neurological: Pt is alert and oriented to person, place, and time. No cranial nerve deficit. Coordination, balance, strength, speech and gait are normal.  Skin: Skin is warm and dry. No rash noted. No erythema.  Psychiatric: Patient has a normal mood and affect. behavior is normal. Judgment and thought content normal.   No results found for this or any previous visit (from the past 72 hour(s)).   PHQ2/9: Depression screen [facility] 2/9 03/04/2019 11/09/2018 10/08/2018 09/01/2018 03/11/2018  Decreased Interest 0 0 0 0 0  Down, Depressed, Hopeless 0 0 0 0 0  PHQ - 2 Score 0 0 0 0 0  Altered sleeping 3 1 - 0 0  Tired, decreased energy 0 0 - 0 0  Change in appetite 0 0 - 0 0  Feeling bad or failure about yourself  0 0 - 0 0  Trouble concentrating 0 0 - 0 0  Moving slowly or fidgety/restless 0 0 - 0 0  Suicidal thoughts 0 0 - 0 0  PHQ-9 Score 3 1 - 0 0  Difficult doing work/chores Not difficult at all Somewhat difficult - Not difficult at all Not difficult at all   PHQ-2/9 Result is negative.    Fall Risk: Fall Risk  03/04/2019 11/09/2018 10/08/2018 09/01/2018 03/11/2018  Falls in the past  year? 0 0 0 0 0  Number falls in past yr: 0 0 0 0 -  Injury with Fall? 0 0 0 0 -  Follow up - - Falls prevention discussed - -    Functional Status Survey: Is the patient deaf or have difficulty hearing?: Yes Does the patient have difficulty seeing, even when wearing glasses/contacts?: No Does the patient have difficulty concentrating, remembering, or making decisions?: No Does the patient have difficulty walking or climbing stairs?: No Does the patient have difficulty dressing or bathing?: No Does the patient have difficulty doing errands alone such as visiting a doctor's office or shopping?: No   Assessment & Plan  1. Abdominal wall mass of left lower quadrant - ?Lipoma vs hernia vs other mass etiology - US Abdomen Limited; Future  2. Mixed hyperlipidemia - Simvastatin; diet discussed  3. Pre-diabetes - Diet controlle  4. Obesity (BMI 35.0-39.9 without comorbidity) - Discussed with the patient the risk posed by an increased BMI. Discussed importance of portion control, calorie counting and at least 150 minutes of physical activity weekly. Avoid sweet beverages and drink more water. Eat at least 6 servings of fruit and vegetables daily   5. BPH with obstruction/lower urinary tract symptoms - Finasteride working well for him  6. Spondylosis of lumbar region without myelopathy or radiculopathy - Discussed his chronic NSAID use in detail and risks posed to kidneys and GI tract, does not want to stop, taking famotidine.   7. Primary osteoarthritis involving multiple joints - Still taking ibuprofen BID  8. Gastroesophageal reflux disease without esophagitis - Famotidine, doing well with this.   9. Great toe pain, left - Ambulatory referral to Podiatry  10. Acute foot pain, left - Ambulatory referral to Podiatry

## 2019-03-07 DIAGNOSIS — M25562 Pain in left knee: Secondary | ICD-10-CM | POA: Diagnosis not present

## 2019-03-09 DIAGNOSIS — M25562 Pain in left knee: Secondary | ICD-10-CM | POA: Diagnosis not present

## 2019-03-11 ENCOUNTER — Ambulatory Visit (INDEPENDENT_AMBULATORY_CARE_PROVIDER_SITE_OTHER): Payer: Medicare Other

## 2019-03-11 ENCOUNTER — Other Ambulatory Visit: Payer: Self-pay

## 2019-03-11 ENCOUNTER — Ambulatory Visit (INDEPENDENT_AMBULATORY_CARE_PROVIDER_SITE_OTHER): Payer: Medicare Other | Admitting: Podiatry

## 2019-03-11 ENCOUNTER — Other Ambulatory Visit: Payer: Self-pay | Admitting: Podiatry

## 2019-03-11 DIAGNOSIS — M205X2 Other deformities of toe(s) (acquired), left foot: Secondary | ICD-10-CM

## 2019-03-11 DIAGNOSIS — M205X1 Other deformities of toe(s) (acquired), right foot: Secondary | ICD-10-CM | POA: Diagnosis not present

## 2019-03-11 DIAGNOSIS — M7752 Other enthesopathy of left foot: Secondary | ICD-10-CM

## 2019-03-11 DIAGNOSIS — M79672 Pain in left foot: Secondary | ICD-10-CM

## 2019-03-11 DIAGNOSIS — M79671 Pain in right foot: Secondary | ICD-10-CM | POA: Diagnosis not present

## 2019-03-14 DIAGNOSIS — M25562 Pain in left knee: Secondary | ICD-10-CM | POA: Diagnosis not present

## 2019-03-16 DIAGNOSIS — M25562 Pain in left knee: Secondary | ICD-10-CM | POA: Diagnosis not present

## 2019-03-16 NOTE — Progress Notes (Signed)
   HPI: 69 y.o. male presenting today as a new patient with a chief complaint of tenderness to the bilateral great toes that began about 7 weeks ago after having a left total knee replacement. He states the left great toe hurts worse than the right. He reports associated intermittent swelling. Touching the toes increases the pain. He has been taking Motrin 800 mg twice daily for treatment. Patient is here for further evaluation and treatment.   Past Medical History:  Diagnosis Date  . Allergy   . Arthritis   . Diverticulosis   . GERD (gastroesophageal reflux disease)   . Hyperlipidemia      Physical Exam: General: The patient is alert and oriented x3 in no acute distress.  Dermatology: Skin is warm, dry and supple bilateral lower extremities. Negative for open lesions or macerations.  Vascular: Palpable pedal pulses bilaterally. No edema or erythema noted. Capillary refill within normal limits.  Neurological: Epicritic and protective threshold grossly intact bilaterally.   Musculoskeletal Exam: Pain on palpation with limited range of motion noted to the first MPJ of the bilateral feet.  Radiographic Exam: Degenerative changes noted with joint space narrowing first MPJ. There also appears to be extra-articular spurring noted about the joint.   Assessment: 1. S/p left knee replacement 2. Hallux limitus bilateral, left greater than right    Plan of Care:  1. Patient evaluated. X-Rays reviewed.  2. Injection of 0.5 mLs Celestone Soluspan injected into the 1st MPJ of the left foot.  3. Continue taking Motrin 800 mg BID.  4. Return to clinic as needed.       Edrick Kins, DPM [facility]  Dr. Edrick Kins, DPM    [address], [place] [address]                Office [phone]  Fax [phone]

## 2019-03-17 ENCOUNTER — Other Ambulatory Visit: Payer: Self-pay

## 2019-03-17 ENCOUNTER — Ambulatory Visit
Admission: RE | Admit: 2019-03-17 | Discharge: 2019-03-17 | Disposition: A | Discharge status: Home / Self Care | Payer: Medicare Other | Source: Ambulatory Visit | Attending: Family Medicine | Admitting: Family Medicine

## 2019-03-17 DIAGNOSIS — R1904 Left lower quadrant abdominal swelling, mass and lump: Secondary | ICD-10-CM | POA: Diagnosis not present

## 2019-03-21 DIAGNOSIS — M25562 Pain in left knee: Secondary | ICD-10-CM | POA: Diagnosis not present

## 2019-03-23 DIAGNOSIS — M25562 Pain in left knee: Secondary | ICD-10-CM | POA: Diagnosis not present

## 2019-03-28 DIAGNOSIS — M25562 Pain in left knee: Secondary | ICD-10-CM | POA: Diagnosis not present

## 2019-03-29 ENCOUNTER — Other Ambulatory Visit: Payer: Self-pay | Admitting: Family Medicine

## 2019-03-29 DIAGNOSIS — N138 Other obstructive and reflux uropathy: Secondary | ICD-10-CM

## 2019-03-29 DIAGNOSIS — N401 Enlarged prostate with lower urinary tract symptoms: Secondary | ICD-10-CM

## 2019-03-30 ENCOUNTER — Other Ambulatory Visit: Payer: Self-pay | Admitting: Emergency Medicine

## 2019-03-30 DIAGNOSIS — K219 Gastro-esophageal reflux disease without esophagitis: Secondary | ICD-10-CM

## 2019-03-30 DIAGNOSIS — M25562 Pain in left knee: Secondary | ICD-10-CM | POA: Diagnosis not present

## 2019-03-31 MED ORDER — FAMOTIDINE 20 MG PO TABS: 20.0000 mg | ORAL_TABLET | Freq: Two times a day (BID) | ORAL | 1 refills | Status: DC | PRN

## 2019-04-04 DIAGNOSIS — M25562 Pain in left knee: Secondary | ICD-10-CM | POA: Diagnosis not present

## 2019-04-06 DIAGNOSIS — M25562 Pain in left knee: Secondary | ICD-10-CM | POA: Diagnosis not present

## 2019-04-07 DIAGNOSIS — Z96652 Presence of left artificial knee joint: Secondary | ICD-10-CM | POA: Diagnosis not present

## 2019-04-07 DIAGNOSIS — M19011 Primary osteoarthritis, right shoulder: Secondary | ICD-10-CM | POA: Diagnosis not present

## 2019-04-07 DIAGNOSIS — M19012 Primary osteoarthritis, left shoulder: Secondary | ICD-10-CM | POA: Diagnosis not present

## 2019-04-11 DIAGNOSIS — M25562 Pain in left knee: Secondary | ICD-10-CM | POA: Diagnosis not present

## 2019-04-13 DIAGNOSIS — M25562 Pain in left knee: Secondary | ICD-10-CM | POA: Diagnosis not present

## 2019-04-26 ENCOUNTER — Telehealth: Payer: Self-pay | Admitting: Family Medicine

## 2019-04-26 ENCOUNTER — Other Ambulatory Visit: Payer: Self-pay | Admitting: Family Medicine

## 2019-04-26 DIAGNOSIS — M47816 Spondylosis without myelopathy or radiculopathy, lumbar region: Secondary | ICD-10-CM

## 2019-04-26 DIAGNOSIS — M159 Polyosteoarthritis, unspecified: Secondary | ICD-10-CM

## 2019-04-26 NOTE — Telephone Encounter (Signed)
Requested medication (s) are due for refill today: yes  Requested medication (s) are on the active medication list: no  Last refill: 03/01/19  Future visit scheduled: no  Notes to clinic:  pt was to stop taking at discharge 01/17/2019   Requested Prescriptions  Pending Prescriptions Disp Refills   IBU 800 MG tablet [Pharmacy Med Name: IBUPROFEN 800 MG TAB] 120 tablet 1    Sig: TAKE ONE TABLET EVERY EIGHT HOURS AS NEEDED FOR PAIN      Analgesics:  NSAIDS Passed - 04/26/2019 11:16 AM      Passed - Cr in normal range and within 360 days    Creat  Date Value Ref Range Status  11/09/2018 0.91 0.70 - 1.25 mg/dL Final    Comment:    For patients >55 years of age, the reference limit for Creatinine is approximately 13% higher for people identified as African-American. .    Creatinine, Ser  Date Value Ref Range Status  01/17/2019 0.86 0.61 - 1.24 mg/dL Final          Passed - HGB in normal range and within 360 days    Hemoglobin  Date Value Ref Range Status  01/17/2019 14.7 13.0 - 17.0 g/dL Final   HGB  Date Value Ref Range Status  08/18/2011 14.8 13.0 - 18.0 g/dL Final          Passed - Patient is not pregnant      Passed - Valid encounter within last 12 months    Recent Outpatient Visits           1 month ago Abdominal wall mass of left lower quadrant   [place], FNP   5 months ago Pre-op evaluation   [place], FNP   7 months ago Mixed hyperlipidemia   [place], NP   1 year ago Primary osteoarthritis involving multiple joints   [place], Satira Anis, MD       Future Appointments             Tomorrow Towanda Malkin, MD [facility], [place]   In 5 months  [facility], [facility]

## 2019-04-26 NOTE — Progress Notes (Signed)
Patient ID: Vincent Guerra, male    DOB: Feb 27, 1950, 69 y.o.   MRN: [id number]  PCP: Towanda Malkin, MD  Chief Complaint  Patient presents with  . Cyst    left side of stomach    Subjective:   Vincent Guerra is a 69 y.o. male, presents to clinic with CC of the following:  Chief Complaint  Patient presents with  . Cyst    left side of stomach    HPI: Wife with patient today  Patient is a 69 year old male who saw Raelyn Ensign on 03/04/2019 with complaints of LEFT abdominal soft tissue mass:It was noted that on December 10,  he fell walking to the bathroom after having TKR surgery and was using a walker.  He noted to me today that he did not hit his lower abdomen with a walker, but rather it was a strain as he was catching himself, and felt something pull.  The next day if felt more uncomfortable, and the symptoms have persisted. He does not have any constant pain, not bothersome at rest, just notes that it is irritating with his belt pressing on this area at times, and sometimes with certain movements.  When in bed will turn a certain way, and feel pain. He noted it did not bruise or become red after.  He states it feels a little swollen in that area still, and not a hard discrete lump that he can put his finger on, more in the area at the left belt line of the abdomen.  An ultrasound obtained on 2/21 showed: CLINICAL DATA: Abdominal wall mass of left lower quadrant.   EXAM:  ULTRASOUND ABDOMEN LIMITED   COMPARISON: August 18, 2011.   FINDINGS:  Limited sonographic evaluation was performed in the area of palpable  concern in the left lower quadrant. No sonographic evidence of mass,  cyst or fluid collection is noted. No definite hernia is noted.   IMPRESSION:  No definite sonographic abnormality seen in the area of palpable  concern in the left lower quadrant of the abdomen.    Electronically Signed  By: Marijo Conception M.D.  On: 03/17/2019 14:32      He notes he was constipated initially after the total knee replacement surgery and taking the pain medications, although that has improved, and has had no further straining with bowel movements in the recent past.  No change in bowel habits, no blood in the stools, dark tarry stools, or diarrhea.  No nausea/vomiting.  He has a history of spondylosis of lumbar regionand OA, status post the total knee replacement of the left knee in Dec, notes the right knee has symptoms at times, and also some shoulder symptoms which she has seen orthopedics for as well recently. He sees orthopedic- Dr. Ronnie Derby with [facility] for knee and shoulder  He takes ibuprofen 800 mg twice daily now with food and states he cannot function without taking this.  He also takes the stomach medicine (famotidine), when he takes the ibuprofen.  He called yesterday for a refill. I did refill it for him, and noted when it is due for a refill in approximately 60 days or longer if he can make it last longer, discussed checking kidney function with lab tests, and cautioned with chronic NSAID use.    Patient Active Problem List   Diagnosis Date Noted  . GERD (gastroesophageal reflux disease) 08/31/2018  . Allergic rhinitis 03/11/2018  . Alcohol use 03/11/2018  . Obesity (  BMI 35.0-39.9 without comorbidity) 08/03/2017  . Environmental and seasonal allergies 08/22/2016  . Diverticulosis 08/22/2016  . BPH with obstruction/lower urinary tract symptoms 08/22/2016  . Pre-diabetes 08/22/2016  . Osteoarthritis of multiple joints 08/22/2016  . Degenerative joint disease (DJD) of lumbar spine 08/22/2016  . History of sciatica 08/22/2016  . Hyperlipidemia 08/22/2016      Current Outpatient Medications:  .  diclofenac sodium (VOLTAREN) 1 % GEL, Apply 2 g topically 3 (three) times daily as needed. Use for hands and knee arthritis, joint pain (Patient taking differently: Apply 2 g topically 3 (three) times daily as needed (shoulder  pain/hands & knee pain.). ), Disp: 100 g, Rfl: 2 .  famotidine (PEPCID) 20 MG tablet, Take 1 tablet (20 mg total) by mouth 2 (two) times daily as needed for heartburn or indigestion., Disp: 180 tablet, Rfl: 1 .  finasteride (PROSCAR) 5 MG tablet, TAKE ONE TABLET EVERY DAY, Disp: 90 tablet, Rfl: 3 .  fluticasone (FLONASE) 50 MCG/ACT nasal spray, INHALE 2 SPRAYS IN EACH NOSTRIL EACH MORNING., Disp: 16 g, Rfl: 10 .  ibuprofen (IBU) 800 MG tablet, Take 1 tablet (800 mg total) by mouth every 8 (eight) hours as needed. Take with food, Disp: 120 tablet, Rfl: 0 .  loratadine (CLARITIN) 10 MG tablet, Take 10 mg by mouth 2 (two) times daily. , Disp: , Rfl:  .  Magnesium 500 MG TABS, Take 500 mg by mouth at bedtime., Disp: , Rfl:  .  Multiple Vitamin (MULTIVITAMIN WITH MINERALS) TABS tablet, Take 1 tablet by mouth at bedtime., Disp: , Rfl:  .  Omega-3 Fatty Acids (FISH OIL) 1200 MG CAPS, Take 1,200 mg by mouth 2 (two) times daily. , Disp: , Rfl:  .  simvastatin (ZOCOR) 40 MG tablet, TAKE ONE TABLET AT BEDTIME (Patient taking differently: Take 40 mg by mouth at bedtime. ), Disp: 90 tablet, Rfl: 3 .  Turmeric 500 MG CAPS, Take 500 mg by mouth every evening., Disp: , Rfl:    Allergies  Allergen Reactions  . Metronidazole Hives  . Tetanus Toxoid Adsorbed Hives     Past Surgical History:  Procedure Laterality Date  . BACK SURGERY  01/17/2015   Dr Harl [facility]), Multiple diskectomy and herniated disc  . TOTAL KNEE ARTHROPLASTY Left 01/17/2019   Procedure: TOTAL KNEE ARTHROPLASTY;  Surgeon: Vickey Huger, MD;  Location: [facility];  Service: Orthopedics;  Laterality: Left;  75 mins needed for length of case. same day discharge  . VASECTOMY       Family History  Problem Relation Age of Onset  . Heart disease Mother   . Alzheimer's disease Mother   . Heart attack Mother   . Prostate cancer Father 9  . Heart disease Brother 65       CABG  . Heart disease Brother        CABG     Social  History   Tobacco Use  . Smoking status: Former Smoker    Packs/day: 1.00    Types: Cigarettes    Quit date: 1994    Years since quitting: 27.2  . Smokeless tobacco: Never Used  Substance Use Topics  . Alcohol use: Yes    Alcohol/week: 24.0 standard drinks    Types: 24 Cans of beer per week    With staff assistance, above reviewed with the patient today.  ROS: As per HPI, otherwise no specific complaints on a limited and focused system review   No results found for this or any previous  visit (from the past 72 hour(s)).   PHQ2/9: Depression screen [facility] 2/9 04/27/2019 03/04/2019 11/09/2018 10/08/2018 09/01/2018  Decreased Interest 0 0 0 0 0  Down, Depressed, Hopeless 0 0 0 0 0  PHQ - 2 Score 0 0 0 0 0  Altered sleeping 0 3 1 - 0  Tired, decreased energy 0 0 0 - 0  Change in appetite 0 0 0 - 0  Feeling bad or failure about yourself  0 0 0 - 0  Trouble concentrating 0 0 0 - 0  Moving slowly or fidgety/restless 0 0 0 - 0  Suicidal thoughts 0 0 0 - 0  PHQ-9 Score 0 3 1 - 0  Difficult doing work/chores Not difficult at all Not difficult at all Somewhat difficult - Not difficult at all   PHQ-2/9 Result is   Fall Risk: Fall Risk  04/27/2019 03/04/2019 11/09/2018 10/08/2018 09/01/2018  Falls in the past year? 0 0 0 0 0  Number falls in past yr: 0 0 0 0 0  Injury with Fall? 0 0 0 0 0  Follow up - - - Falls prevention discussed -      Objective:   Vitals:   04/27/19 0943  BP: 132/86  Pulse: 70  Resp: 16  Temp: 98.2 F (36.8 C)  TempSrc: Temporal  SpO2: 95%  Weight: 234 lb 6.4 oz (106.3 kg)  Height: 5\' 10"  (1.778 m)    Body mass index is 33.63 kg/m.  Physical Exam   NAD, masked, pleasant  HEENT - [place]/AT, sclera anicteric, Abd - soft, obese, NT diffusely and not markedly tender to palpate the area in the left lower quadrant that is intermittently painful with a slight protuberance in this region and no focal nodularity or marked firmness, the area was soft with no overlying  skin changes,  BS+,  no masses, no HSM.  Of note, he wears his belt quite high on the abdomen slightly below the level of the umbilicus with an indentation from the belt evident, and the protuberance immediately above this. Back - no focal CVA tenderness,  Neuro/psychiatric - affect was not flat, appropriate with conversation  Alert and oriented  Speech  normal   Results for orders placed or performed during the hospital encounter of 01/17/19  CBC WITH DIFFERENTIAL  Result Value Ref Range   WBC 8.9 4.0 - 10.5 K/uL   RBC 4.82 4.22 - 5.81 MIL/uL   Hemoglobin 14.7 13.0 - 17.0 g/dL   HCT 43.9 39.0 - 52.0 %   MCV 91.1 80.0 - 100.0 fL   MCH 30.5 26.0 - 34.0 pg   MCHC 33.5 30.0 - 36.0 g/dL   RDW 12.9 11.5 - 15.5 %   Platelets 209 150 - 400 K/uL   nRBC 0.0 0.0 - 0.2 %   Neutrophils Relative % 66 %   Neutro Abs 5.8 1.7 - 7.7 K/uL   Lymphocytes Relative 21 %   Lymphs Abs 1.9 0.7 - 4.0 K/uL   Monocytes Relative 9 %   Monocytes Absolute 0.8 0.1 - 1.0 K/uL   Eosinophils Relative 3 %   Eosinophils Absolute 0.3 0.0 - 0.5 K/uL   Basophils Relative 1 %   Basophils Absolute 0.1 0.0 - 0.1 K/uL   Immature Granulocytes 0 %   Abs Immature Granulocytes 0.04 0.00 - 0.07 K/uL  Comprehensive metabolic panel  Result Value Ref Range   Sodium 143 135 - 145 mmol/L   Potassium 3.9 3.5 - 5.1 mmol/L   Chloride 108  98 - 111 mmol/L   CO2 23 22 - 32 mmol/L   Glucose, Bld 129 (H) 70 - 99 mg/dL   BUN 19 8 - 23 mg/dL   Creatinine, Ser 0.86 0.61 - 1.24 mg/dL   Calcium 9.6 8.9 - 10.3 mg/dL   Total Protein 7.3 6.5 - 8.1 g/dL   Albumin 4.1 3.5 - 5.0 g/dL   AST 19 15 - 41 U/L   ALT 33 0 - 44 U/L   Alkaline Phosphatase 49 38 - 126 U/L   Total Bilirubin 0.4 0.3 - 1.2 mg/dL   GFR calc non Af Amer >60 >60 mL/min   GFR calc Af Amer >60 >60 mL/min   Anion gap 12 5 - 15       Assessment & Plan:    1. Left lower quadrant abdominal pain Started after a strain injury, and symptoms have continued intermittently.   He did have an ultrasound done which was unremarkable.  The exam is consistent with no soft tissue mass and consistent with the ultrasound findings.  Do feel like we had an abdominal muscle strain, and it is slowly improving, although does take time.  He has had no concerning change in bowel habits in the recent past. After explanation, he noted that seem to make sense. Recommended avoiding any Valsalva type of activities lessening stress on the area. Also relative activity modifications to help this continue to heal. Can apply cold to the area if it is uncomfortable, especially at the end of the day Recommended avoiding tight constriction of this area that he has with the belt, suggested something like sweatpants with an elastic band, and he noted at night he often does not have the belt in place and relaxes in his chair. Do not feel any further study is needed at this point, and continue to monitor.  2. Abdominal muscle strain, subsequent encounter As noted above  3. Encounter for monitoring chronic NSAID therapy He takes ibuprofen 800 mg twice daily now with food and states he cannot function without taking this.  He also takes the stomach medicine (famotidine), when he takes the ibuprofen.  He called yesterday for a refill.  I did refill it for him, and noted when it is due for a refill in approximately 60 days or longer if he can make it last longer, discussed checking kidney function with lab tests, and discussed some of this concerns with chronic NSAID use.    He continues to follow with orthopedics, and may need the other knee done soon, and they have been helpful with some shoulder pains and some injections recently to try to help in the management.    Towanda Malkin, MD 04/27/19 10:07 AM

## 2019-04-26 NOTE — Telephone Encounter (Signed)
Copied from [place] [id number]. Topic: General - Other >> Apr 26, 2019  9:58 AM Keene Breath wrote: Reason for CRM: Patient would like to get a CT scan regarding the lump on his side which was discussed with the doctor previously.  He said she told him to let her know if it did not improve, then he could get the scan.  Please call to discuss at [phone]

## 2019-04-26 NOTE — Telephone Encounter (Signed)
Renewed ibuprofen after review of Emily's last note in Jan 2021.  Takes up to twice daily. Rec'ed take with food. 120 tablets should last 60 days, and if taking regularly and requests a refill, good to check a kidney function test at that time as well.

## 2019-04-26 NOTE — Telephone Encounter (Signed)
Requested Prescriptions  Pending Prescriptions Disp Refills  . fluticasone (FLONASE) 50 MCG/ACT nasal spray [Pharmacy Med Name: FLUTICASONE PROPIONATE 50 MCG/ACT N] 16 g 10    Sig: INHALE 2 SPRAYS IN EACH NOSTRIL EACH MORNING.     Ear, Nose, and Throat: Nasal Preparations - Corticosteroids Passed - 04/26/2019 11:20 AM      Passed - Valid encounter within last 12 months    Recent Outpatient Visits          1 month ago Abdominal wall mass of left lower quadrant   [place], FNP   5 months ago Pre-op evaluation   [place], FNP   7 months ago Mixed hyperlipidemia   [place], NP   1 year ago Primary osteoarthritis involving multiple joints   [facility], Satira Anis, MD      Future Appointments            Tomorrow Towanda Malkin, MD [facility], [place]   In 5 months  [facility], [facility]

## 2019-04-26 NOTE — Telephone Encounter (Signed)
Pt is scheduled with Dr Roxan Hockey for 04/27/2019. Per pt, wants him as his PCP

## 2019-04-27 ENCOUNTER — Encounter: Payer: Self-pay | Admitting: Internal Medicine

## 2019-04-27 ENCOUNTER — Ambulatory Visit (INDEPENDENT_AMBULATORY_CARE_PROVIDER_SITE_OTHER): Payer: Medicare Other | Admitting: Internal Medicine

## 2019-04-27 ENCOUNTER — Other Ambulatory Visit: Payer: Self-pay

## 2019-04-27 VITALS — BP 132/86 | HR 70 | Temp 98.2°F | Resp 16 | Ht 70.0 in | Wt 234.4 lb

## 2019-04-27 DIAGNOSIS — Z5181 Encounter for therapeutic drug level monitoring: Secondary | ICD-10-CM | POA: Diagnosis not present

## 2019-04-27 DIAGNOSIS — S39011D Strain of muscle, fascia and tendon of abdomen, subsequent encounter: Secondary | ICD-10-CM | POA: Diagnosis not present

## 2019-04-27 DIAGNOSIS — R1032 Left lower quadrant pain: Secondary | ICD-10-CM | POA: Diagnosis not present

## 2019-04-27 DIAGNOSIS — Z791 Long term (current) use of non-steroidal anti-inflammatories (NSAID): Secondary | ICD-10-CM

## 2019-05-10 DIAGNOSIS — D2262 Melanocytic nevi of left upper limb, including shoulder: Secondary | ICD-10-CM | POA: Diagnosis not present

## 2019-05-10 DIAGNOSIS — D2261 Melanocytic nevi of right upper limb, including shoulder: Secondary | ICD-10-CM | POA: Diagnosis not present

## 2019-05-10 DIAGNOSIS — X32XXXA Exposure to sunlight, initial encounter: Secondary | ICD-10-CM | POA: Diagnosis not present

## 2019-05-10 DIAGNOSIS — D2271 Melanocytic nevi of right lower limb, including hip: Secondary | ICD-10-CM | POA: Diagnosis not present

## 2019-05-10 DIAGNOSIS — Z85828 Personal history of other malignant neoplasm of skin: Secondary | ICD-10-CM | POA: Diagnosis not present

## 2019-05-10 DIAGNOSIS — L821 Other seborrheic keratosis: Secondary | ICD-10-CM | POA: Diagnosis not present

## 2019-05-10 DIAGNOSIS — L570 Actinic keratosis: Secondary | ICD-10-CM | POA: Diagnosis not present

## 2019-07-04 ENCOUNTER — Ambulatory Visit
Admission: EM | Admit: 2019-07-04 | Discharge: 2019-07-04 | Disposition: A | Discharge status: Home / Self Care | Payer: Medicare Other | Attending: Family Medicine | Admitting: Family Medicine

## 2019-07-04 ENCOUNTER — Encounter: Payer: Self-pay | Admitting: Emergency Medicine

## 2019-07-04 ENCOUNTER — Ambulatory Visit: Payer: Self-pay | Admitting: Internal Medicine

## 2019-07-04 ENCOUNTER — Other Ambulatory Visit: Payer: Self-pay

## 2019-07-04 DIAGNOSIS — E669 Obesity, unspecified: Secondary | ICD-10-CM | POA: Diagnosis not present

## 2019-07-04 DIAGNOSIS — K82A1 Gangrene of gallbladder in cholecystitis: Secondary | ICD-10-CM | POA: Diagnosis not present

## 2019-07-04 DIAGNOSIS — D72829 Elevated white blood cell count, unspecified: Secondary | ICD-10-CM | POA: Diagnosis not present

## 2019-07-04 DIAGNOSIS — R160 Hepatomegaly, not elsewhere classified: Secondary | ICD-10-CM | POA: Diagnosis not present

## 2019-07-04 DIAGNOSIS — K8020 Calculus of gallbladder without cholecystitis without obstruction: Secondary | ICD-10-CM | POA: Diagnosis not present

## 2019-07-04 DIAGNOSIS — E785 Hyperlipidemia, unspecified: Secondary | ICD-10-CM | POA: Diagnosis not present

## 2019-07-04 DIAGNOSIS — K760 Fatty (change of) liver, not elsewhere classified: Secondary | ICD-10-CM | POA: Diagnosis not present

## 2019-07-04 DIAGNOSIS — K8000 Calculus of gallbladder with acute cholecystitis without obstruction: Secondary | ICD-10-CM | POA: Diagnosis not present

## 2019-07-04 DIAGNOSIS — R109 Unspecified abdominal pain: Secondary | ICD-10-CM | POA: Diagnosis not present

## 2019-07-04 DIAGNOSIS — Z87891 Personal history of nicotine dependence: Secondary | ICD-10-CM | POA: Diagnosis not present

## 2019-07-04 DIAGNOSIS — R1013 Epigastric pain: Secondary | ICD-10-CM | POA: Insufficient documentation

## 2019-07-04 DIAGNOSIS — Z96652 Presence of left artificial knee joint: Secondary | ICD-10-CM | POA: Diagnosis not present

## 2019-07-04 DIAGNOSIS — M549 Dorsalgia, unspecified: Secondary | ICD-10-CM | POA: Diagnosis not present

## 2019-07-04 DIAGNOSIS — R1011 Right upper quadrant pain: Secondary | ICD-10-CM | POA: Diagnosis not present

## 2019-07-04 DIAGNOSIS — K219 Gastro-esophageal reflux disease without esophagitis: Secondary | ICD-10-CM | POA: Diagnosis not present

## 2019-07-04 DIAGNOSIS — Z6832 Body mass index (BMI) 32.0-32.9, adult: Secondary | ICD-10-CM | POA: Diagnosis not present

## 2019-07-04 LAB — COMPREHENSIVE METABOLIC PANEL
ALT: 31 U/L (ref 0–44)
AST: 23 U/L (ref 15–41)
Albumin: 4.3 g/dL (ref 3.5–5.0)
Alkaline Phosphatase: 49 U/L (ref 38–126)
Anion gap: 8 (ref 5–15)
BUN: 15 mg/dL (ref 8–23)
CO2: 23 mmol/L (ref 22–32)
Calcium: 9 mg/dL (ref 8.9–10.3)
Chloride: 107 mmol/L (ref 98–111)
Creatinine, Ser: 0.9 mg/dL (ref 0.61–1.24)
GFR calc Af Amer: >60 mL/min (ref >60)
GFR calc non Af Amer: >60 mL/min (ref >60)
Glucose, Bld: 140 mg/dL — ABNORMAL HIGH (ref 70–99)
Potassium: 4.1 mmol/L (ref 3.5–5.1)
Sodium: 138 mmol/L (ref 135–145)
Total Bilirubin: 0.5 mg/dL (ref 0.3–1.2)
Total Protein: 7.4 g/dL (ref 6.5–8.1)

## 2019-07-04 LAB — LIPASE, BLOOD: Lipase: 21 U/L (ref 11–51)

## 2019-07-04 MED ORDER — LIDOCAINE VISCOUS HCL 2 % MT SOLN
15.0000 mL | Freq: Once | OROMUCOSAL | Status: AC
  Administered 2019-07-04: 15 mL via ORAL

## 2019-07-04 MED ORDER — ALUM & MAG HYDROXIDE-SIMETH 200-200-20 MG/5ML PO SUSP
30.0000 mL | Freq: Once | ORAL | Status: AC
  Administered 2019-07-04: 30 mL via ORAL

## 2019-07-04 NOTE — ED Provider Notes (Signed)
[facility]    CSN: [id number] Arrival date & time: 07/04/19  [id number]      History   Chief Complaint No chief complaint on file.   HPI Vincent Guerra is a 69 y.o. male.   69 yo male with a c/o stomach pain since this morning. States he felt like "belching". Denies any vomiting, diarrhea, constipation, fevers, chills, chest pressure/pain, shortness of breath. States he takes a lot of ibuprofen for the arthritis of his hands.       Past Medical History:  Diagnosis Date  . Allergy   . Arthritis   . Diverticulosis   . GERD (gastroesophageal reflux disease)   . Hyperlipidemia     Patient Active Problem List   Diagnosis Date Noted  . Encounter for monitoring chronic NSAID therapy 04/27/2019  . Abdominal muscle strain, subsequent encounter 04/27/2019  . GERD (gastroesophageal reflux disease) 08/31/2018  . Allergic rhinitis 03/11/2018  . Alcohol use 03/11/2018  . Obesity (BMI 35.0-39.9 without comorbidity) 08/03/2017  . Environmental and seasonal allergies 08/22/2016  . Diverticulosis 08/22/2016  . BPH with obstruction/lower urinary tract symptoms 08/22/2016  . Pre-diabetes 08/22/2016  . Osteoarthritis of multiple joints 08/22/2016  . Degenerative joint disease (DJD) of lumbar spine 08/22/2016  . History of sciatica 08/22/2016  . Hyperlipidemia 08/22/2016    Past Surgical History:  Procedure Laterality Date  . BACK SURGERY  01/17/2015   Dr Harl [facility]), Multiple diskectomy and herniated disc  . TOTAL KNEE ARTHROPLASTY Left 01/17/2019   Procedure: TOTAL KNEE ARTHROPLASTY;  Surgeon: Vickey Huger, MD;  Location: [facility];  Service: Orthopedics;  Laterality: Left;  75 mins needed for length of case. same day discharge  . VASECTOMY         Home Medications    Prior to Admission medications   Medication Sig Start Date End Date Taking? Authorizing Provider  aspirin 81 MG chewable tablet Chew by mouth daily.   Yes [provider]    diclofenac sodium (VOLTAREN) 1 % GEL Apply 2 g topically 3 (three) times daily as needed. Use for hands and knee arthritis, joint pain Patient taking differently: Apply 2 g topically 3 (three) times daily as needed (shoulder pain/hands & knee pain.).  03/02/17  Yes Karamalegos, Devonne Doughty, DO  famotidine (PEPCID) 20 MG tablet Take 1 tablet (20 mg total) by mouth 2 (two) times daily as needed for heartburn or indigestion. 03/31/19  Yes Towanda Malkin, MD  finasteride (PROSCAR) 5 MG tablet TAKE ONE TABLET EVERY DAY 03/29/19  Yes Towanda Malkin, MD  fluticasone [facility]) 50 MCG/ACT nasal spray INHALE 2 SPRAYS IN EACH NOSTRIL EACH MORNING. 04/26/19  Yes Hubbard Hartshorn, FNP  ibuprofen (IBU) 800 MG tablet Take 1 tablet (800 mg total) by mouth every 8 (eight) hours as needed. Take with food 04/26/19  Yes Towanda Malkin, MD  loratadine (CLARITIN) 10 MG tablet Take 10 mg by mouth 2 (two) times daily.    Yes [provider]  Magnesium 500 MG TABS Take 500 mg by mouth at bedtime.   Yes [provider]  Multiple Vitamin (MULTIVITAMIN WITH MINERALS) TABS tablet Take 1 tablet by mouth at bedtime.   Yes [provider]  Omega-3 Fatty Acids (FISH OIL) 1200 MG CAPS Take 1,200 mg by mouth 2 (two) times daily.    Yes [provider]  simvastatin (ZOCOR) 40 MG tablet TAKE ONE TABLET AT BEDTIME Patient taking differently: Take 40 mg by mouth at bedtime.  12/21/18  Yes Hubbard Hartshorn, FNP  Turmeric 500 MG CAPS Take 500 mg by mouth every evening.   Yes [provider]    Family History Family History  Problem Relation Age of Onset  . Heart disease Mother   . Alzheimer's disease Mother   . Heart attack Mother   . Prostate cancer Father 21  . Heart disease Brother 61       CABG  . Heart disease Brother        CABG    Social History Social History   Tobacco Use  . Smoking status: Former Smoker    Packs/day: 1.00    Types: Cigarettes     Quit date: 1994    Years since quitting: 27.4  . Smokeless tobacco: Never Used  Substance Use Topics  . Alcohol use: Yes    Alcohol/week: 24.0 standard drinks    Types: 24 Cans of beer per week  . Drug use: No     Allergies   Metronidazole and Tetanus toxoid adsorbed   Review of Systems Review of Systems   Physical Exam Triage Vital Signs ED Triage Vitals  Enc Vitals Group     BP 07/04/19 1004 (!) 146/93     Pulse Rate 07/04/19 1004 68     Resp 07/04/19 1004 18     Temp 07/04/19 1004 98.3 F (36.8 C)     Temp Source 07/04/19 1004 Oral     SpO2 07/04/19 1004 98 %     Weight 07/04/19 1002 235 lb (106.6 kg)     Height 07/04/19 1002 5\' 11"  (1.803 m)     Head Circumference --      Peak Flow --      Pain Score 07/04/19 1001 6     Pain Loc --      Pain Edu? --      Excl. in [place]? --    No data found.  Updated Vital Signs BP (!) 146/93 (BP Location: Left Arm)   Pulse 68   Temp 98.3 F (36.8 C) (Oral)   Resp 18   Ht 5\' 11"  (1.803 m)   Wt 106.6 kg   SpO2 98%   BMI 32.78 kg/m   Visual Acuity Right Eye Distance:   Left Eye Distance:   Bilateral Distance:    Right Eye Near:   Left Eye Near:    Bilateral Near:     Physical Exam Vitals and nursing note reviewed.  Constitutional:      General: He is not in acute distress.    Appearance: He is not toxic-appearing or diaphoretic.  Cardiovascular:     Rate and Rhythm: Normal rate.     Heart sounds: Normal heart sounds.  Pulmonary:     Effort: Pulmonary effort is normal. No respiratory distress.     Breath sounds: Normal breath sounds. No stridor. No wheezing, rhonchi or rales.  Abdominal:     General: Bowel sounds are normal. There is no distension.     Palpations: Abdomen is soft. There is no mass.     Tenderness: There is abdominal tenderness (mild; epigastric). There is no right CVA tenderness, left CVA tenderness, guarding or rebound.     Hernia: No hernia is present.  Neurological:     Mental Status: He  is alert.      UC Treatments / Results  Labs (all labs ordered are listed, but only abnormal results are displayed) Labs Reviewed  COMPREHENSIVE METABOLIC PANEL - Abnormal; Notable for the following  components:      Result Value   Glucose, Bld 140 (*)    All other components within normal limits  LIPASE, BLOOD    EKG   Radiology No results found.  Procedures ED EKG  Date/Time: 07/04/2019 5:57 PM Performed by: Norval Gable, MD Authorized by: Norval Gable, MD   ECG reviewed by ED Physician in the absence of a cardiologist: yes   Previous ECG:    Previous ECG:  Compared to current   Similarity:  No change Interpretation:    Interpretation: normal   Rate:    ECG rate:  62   ECG rate assessment: normal   Rhythm:    Rhythm: sinus rhythm   Ectopy:    Ectopy: PAC   QRS:    QRS axis:  Normal   QRS intervals:  Normal Conduction:    Conduction: normal   ST segments:    ST segments:  Normal T waves:    T waves: normal     (including critical care time)  Medications Ordered in UC Medications  alum & mag hydroxide-simeth (MAALOX/MYLANTA) 200-200-20 MG/5ML suspension 30 mL (30 mLs Oral Given 07/04/19 1032)    And  lidocaine (XYLOCAINE) 2 % viscous mouth solution 15 mL (15 mLs Oral Given 07/04/19 1031)    Initial Impression / Assessment and Plan / UC Course  I have reviewed the triage vital signs and the nursing notes.  Pertinent labs & imaging results that were available during my care of the patient were reviewed by me and considered in my medical decision making (see chart for details).     Final Clinical Impressions(s) / UC Diagnoses   Final diagnoses:  Dyspepsia     Discharge Instructions     Continue your stomach medication Try to cut back on ibuprofen    ED Prescriptions    None      1. ekg/lab results and diagnosis reviewed with patient 2. Patient given GI cocktail with improvement of symptom  3. Recommend continue current GI med and  try to cut back on NSAID  4. Follow-up prn if symptoms worsen or don't improve   PDMP not reviewed this encounter.   Norval Gable, MD 07/04/19 [phone]

## 2019-07-04 NOTE — ED Triage Notes (Signed)
Patient c/o mid epigastric pain that started this morning at 3am. Patient states he felt like he had to "belch" but hasn't been able to. Denies nausea, vomiting. Denies chest pain.

## 2019-07-04 NOTE — Telephone Encounter (Signed)
Pt reports upper abdominal pain, onset this AM. States center abdomen, "Where ribs join" radiates across ribs. States "Feels like I have to belch but can't."  Rates pain at 7/10, sudden onset at 0300. States took Copywriter, advertising at 0500, no relief. Reports area is tender to touch. Denies sweating, no nausea. States "Very uncomfortable. No availability with Dr. Roxan Hockey this AM. Directed to UC. States will follow disposition. Care advise given.  Reason for Disposition . [1] Pain lasts > 10 minutes AND [2] age > 57  Answer Assessment - Initial Assessment Questions 1. LOCATION: "Where does it hurt?"      Upper abdomen, mid center 2. RADIATION: "Does the pain shoot anywhere else?" (e.g., chest, back)     Across upper abdomen, ribs 3. ONSET: "When did the pain begin?" (e.g., minutes, hours or days ago)      This am 4. SUDDEN: "Gradual or sudden onset?"     Sudden, 0300 5. PATTERN "Does the pain come and go, or is it constant?"    - If constant: "Is it getting better, staying the same, or worsening?"      (Note: Constant means the pain never goes away completely; most serious pain is constant and it progresses)     - If intermittent: "How long does it last?" "Do you have pain now?"     (Note: Intermittent means the pain goes away completely between bouts)     constant 6. SEVERITY: "How bad is the pain?"  (e.g., Scale 1-10; mild, moderate, or severe)    - MILD (1-3): doesn't interfere with normal activities, abdomen soft and not tender to touch     - MODERATE (4-7): interferes with normal activities or awakens from sleep, tender to touch     - SEVERE (8-10): excruciating pain, doubled over, unable to do any normal activities       7/10 7. RECURRENT SYMPTOM: "Have you ever had this type of abdominal pain before?" If so, ask: "When was the last time?" and "What happened that time?"      no 8. AGGRAVATING FACTORS: "Does anything seem to cause this pain?" (e.g., foods, stress, alcohol)     no 9.  CARDIAC SYMPTOMS: "Do you have any of the following symptoms: chest pain, difficulty breathing, sweating, nausea?"     Feels like I have to belch but can't very uncomfortable. 10. OTHER SYMPTOMS: "Do you have any other symptoms?" (e.g., fever, vomiting, diarrhea)      no  Protocols used: ABDOMINAL PAIN - UPPER-A-AH

## 2019-07-04 NOTE — Discharge Instructions (Signed)
Continue your stomach medication Try to cut back on ibuprofen

## 2019-07-05 DIAGNOSIS — K8020 Calculus of gallbladder without cholecystitis without obstruction: Secondary | ICD-10-CM | POA: Diagnosis not present

## 2019-07-05 DIAGNOSIS — K8000 Calculus of gallbladder with acute cholecystitis without obstruction: Secondary | ICD-10-CM | POA: Diagnosis not present

## 2019-07-05 DIAGNOSIS — D72829 Elevated white blood cell count, unspecified: Secondary | ICD-10-CM | POA: Insufficient documentation

## 2019-07-05 DIAGNOSIS — K82A1 Gangrene of gallbladder in cholecystitis: Secondary | ICD-10-CM | POA: Diagnosis not present

## 2019-07-05 DIAGNOSIS — K810 Acute cholecystitis: Secondary | ICD-10-CM | POA: Diagnosis not present

## 2019-07-07 HISTORY — PX: CHOLECYSTECTOMY: SHX55

## 2019-07-07 MED ORDER — ONDANSETRON HCL 4 MG/2ML IJ SOLN
4.00 | INTRAMUSCULAR | Status: DC
Start: ? — End: 2019-07-07

## 2019-07-07 MED ORDER — PRAVASTATIN SODIUM 20 MG PO TABS
10.00 | ORAL_TABLET | ORAL | Status: DC
Start: 2019-07-06 — End: 2019-07-07

## 2019-07-07 MED ORDER — LORATADINE 10 MG PO TABS
10.00 | ORAL_TABLET | ORAL | Status: DC
Start: 2019-07-07 — End: 2019-07-07

## 2019-07-07 MED ORDER — FINASTERIDE 5 MG PO TABS
5.00 | ORAL_TABLET | ORAL | Status: DC
Start: 2019-07-07 — End: 2019-07-07

## 2019-07-07 MED ORDER — FAMOTIDINE 20 MG PO TABS
20.00 | ORAL_TABLET | ORAL | Status: DC
Start: 2019-07-06 — End: 2019-07-07

## 2019-07-07 MED ORDER — MORPHINE SULFATE 4 MG/ML IJ SOLN
4.00 | INTRAMUSCULAR | Status: DC
Start: ? — End: 2019-07-07

## 2019-07-07 MED ORDER — OXYCODONE HCL 5 MG PO TABS
5.00 | ORAL_TABLET | ORAL | Status: DC
Start: ? — End: 2019-07-07

## 2019-07-15 DIAGNOSIS — M19012 Primary osteoarthritis, left shoulder: Secondary | ICD-10-CM | POA: Diagnosis not present

## 2019-07-15 DIAGNOSIS — Z96652 Presence of left artificial knee joint: Secondary | ICD-10-CM | POA: Diagnosis not present

## 2019-07-22 ENCOUNTER — Other Ambulatory Visit: Payer: Self-pay | Admitting: Internal Medicine

## 2019-07-22 DIAGNOSIS — M159 Polyosteoarthritis, unspecified: Secondary | ICD-10-CM

## 2019-07-22 DIAGNOSIS — M47816 Spondylosis without myelopathy or radiculopathy, lumbar region: Secondary | ICD-10-CM

## 2019-07-22 MED ORDER — IBUPROFEN 800 MG PO TABS: 800.0000 mg | ORAL_TABLET | Freq: Three times a day (TID) | ORAL | 0 refills | Status: DC | PRN

## 2019-07-22 NOTE — Telephone Encounter (Signed)
RX REFILL ibuprofen (IBU) 800 MG tablet [[id number]]  [place], [place] Phone:  [phone]  Fax:  [phone]

## 2019-07-22 NOTE — Telephone Encounter (Signed)
Requested Prescriptions  Pending Prescriptions Disp Refills  . ibuprofen (IBU) 800 MG tablet 270 tablet 0    Sig: Take 1 tablet (800 mg total) by mouth every 8 (eight) hours as needed. Take with food     There is no refill protocol information for this order

## 2019-07-26 ENCOUNTER — Other Ambulatory Visit: Payer: Self-pay

## 2019-07-26 DIAGNOSIS — M159 Polyosteoarthritis, unspecified: Secondary | ICD-10-CM

## 2019-07-26 DIAGNOSIS — M47816 Spondylosis without myelopathy or radiculopathy, lumbar region: Secondary | ICD-10-CM

## 2019-07-26 MED ORDER — IBUPROFEN 800 MG PO TABS: 800.0000 mg | ORAL_TABLET | Freq: Three times a day (TID) | ORAL | 0 refills | Status: DC | PRN

## 2019-08-31 NOTE — Progress Notes (Signed)
Patient ID: Vincent Guerra, male    DOB: 1950/02/28, 69 y.o.   MRN: [id number]  PCP: Towanda Malkin, MD  Chief Complaint  Patient presents with  . Follow-up    6 month f/u     Subjective:   Vincent Guerra is a 69 y.o. male, presents to clinic with CC of the following:  Chief Complaint  Patient presents with  . Follow-up    6 month f/u     HPI:  Patient is a 69 year old male Last visit with Raquel Sarna was in January 2021 Follows up today. I did see him in March for persistent left lower quadrant pain with that note reviewed. He was seen in May in the emergency room with subsequent laparoscopic cholecystectomy done for gangrenous cholecystitis. No concerns after that procedure noted. Is here today with his wife will be seen for her visit after his.  LEFT abdominal pain: December 10 he fell walking to the bathroom after having knee surgery and using a walker. The pain started after this strain injury, and symptoms have continued intermittently.  He did have an ultrasound done which was unremarkable.   From my assessment in March, did feel like he had an abdominal muscle strain, and it is slowly improving, although does take time.  He has had no concerning change in bowel habits in the recent past. After explanation, he noted that seem to make sense. Recommended avoiding any Valsalva type of activities lessening stress on the area. Also relative activity modifications to help this continue to heal. Can apply cold to the area if it is uncomfortable, especially at the end of the day Recommended avoiding tight constriction of this area that he has with the belt, suggested something like sweatpants with an elastic band, and he noted at night he often does not have the belt in place and relaxes in his chair.  He notes is better since March visit, if sleeps wrong can still bother him, not resolved completely, but notes is tolerable and  improving  Hyperlipidemia Medication regimen-simvatatin & fish oilsdaily,  Takes medication as directed.   Lab Results  Component Value Date   CHOL 149 09/01/2018   HDL 47 09/01/2018   LDLCALC 85 09/01/2018   TRIG 79 09/01/2018   CHOLHDL 3.2 09/01/2018    Denies myalgias, denies chest pain, palpitations, shortness of breath, no increased lower extremity swelling,  Pre-diabetes/Obesity Lab Results  Component Value Date   HGBA1C 5.8 (H) 01/04/2019   HGBA1C 5.6 11/09/2018   HGBA1C 5.6 09/01/2018   Lab Results  Component Value Date   LDLCALC 85 09/01/2018   CREATININE 0.90 07/04/2019    Wt Readings from Last 3 Encounters:  09/01/19 236 lb (107 kg)  07/04/19 235 lb (106.6 kg)  04/27/19 234 lb 6.4 oz (106.3 kg)    Diet has been better lately, eats twice daily, notes he grills a lot Denies polyuria, polydipsia, or polyphagia.   BPH Medication regimen-finasteride 5mg  daily.  Wakes up 1-2 times at night to pee. No change recent past States on medication he doesn't dribble anymore and can feel complete bladder emptying.  Spondylosis of lumbar regionand left knee pain/OA Medication regimen-ibuprofen400mg  twice daily (lessened to 1/2 of 800mg ) States he rarely cannot function when he does not take it - discussed monitoring kidney function and caution with chronic NSAID use.   Had LEFT TKR done in December and has recovered well from this.  Had most recent f/u 6/4.  Has not had  recent follow-up with his previous back surgeon.  Back still has some intermittent aches when overdoes it.  Gastroesophageal reflux disease without esophagitis Medication regimen-famotidineBID every day with ibuprofen (not having GERD sx's) On chronic NSAIDs.  Denies chronic abdominal pains, dark or black stools, bleeding per rectum, problems swallowing or nausea or vomiting.  Prostate cancer screening- Positive family history noted with both dad and grandfather having prostate cancer He  has been following PSAs to date Last PSA on 07/29/2017 was 0.1   Patient Active Problem List   Diagnosis Date Noted  . Encounter for monitoring chronic NSAID therapy 04/27/2019  . Abdominal muscle strain, subsequent encounter 04/27/2019  . GERD (gastroesophageal reflux disease) 08/31/2018  . Allergic rhinitis 03/11/2018  . Alcohol use 03/11/2018  . Obesity (BMI 35.0-39.9 without comorbidity) 08/03/2017  . Environmental and seasonal allergies 08/22/2016  . Diverticulosis 08/22/2016  . BPH with obstruction/lower urinary tract symptoms 08/22/2016  . Pre-diabetes 08/22/2016  . Osteoarthritis of multiple joints 08/22/2016  . Degenerative joint disease (DJD) of lumbar spine 08/22/2016  . History of sciatica 08/22/2016  . Hyperlipidemia 08/22/2016      Current Outpatient Medications:  .  aspirin 81 MG chewable tablet, Chew by mouth daily., Disp: , Rfl:  .  diclofenac sodium (VOLTAREN) 1 % GEL, Apply 2 g topically 3 (three) times daily as needed. Use for hands and knee arthritis, joint pain (Patient taking differently: Apply 2 g topically 3 (three) times daily as needed (shoulder pain/hands & knee pain.). ), Disp: 100 g, Rfl: 2 .  famotidine (PEPCID) 20 MG tablet, Take 1 tablet (20 mg total) by mouth 2 (two) times daily as needed for heartburn or indigestion., Disp: 180 tablet, Rfl: 1 .  finasteride (PROSCAR) 5 MG tablet, TAKE ONE TABLET EVERY DAY, Disp: 90 tablet, Rfl: 3 .  fluticasone (FLONASE) 50 MCG/ACT nasal spray, INHALE 2 SPRAYS IN EACH NOSTRIL EACH MORNING., Disp: 16 g, Rfl: 10 .  ibuprofen (IBU) 800 MG tablet, Take 1 tablet (800 mg total) by mouth every 8 (eight) hours as needed. Take with food, Disp: 270 tablet, Rfl: 0 .  loratadine (CLARITIN) 10 MG tablet, Take 10 mg by mouth 2 (two) times daily. , Disp: , Rfl:  .  Magnesium 500 MG TABS, Take 500 mg by mouth at bedtime., Disp: , Rfl:  .  Multiple Vitamin (MULTIVITAMIN WITH MINERALS) TABS tablet, Take 1 tablet by mouth at bedtime.,  Disp: , Rfl:  .  Omega-3 Fatty Acids (FISH OIL) 1200 MG CAPS, Take 1,200 mg by mouth 2 (two) times daily. , Disp: , Rfl:  .  simvastatin (ZOCOR) 40 MG tablet, TAKE ONE TABLET AT BEDTIME (Patient taking differently: Take 40 mg by mouth at bedtime. ), Disp: 90 tablet, Rfl: 3 .  Turmeric 500 MG CAPS, Take 500 mg by mouth every evening., Disp: , Rfl:    Allergies  Allergen Reactions  . Metronidazole Hives  . Tetanus Toxoid Adsorbed Hives     Past Surgical History:  Procedure Laterality Date  . BACK SURGERY  01/17/2015   Dr Harl Bowie [facility]), Multiple diskectomy and herniated disc  . CHOLECYSTECTOMY  07/07/2019  . TOTAL KNEE ARTHROPLASTY Left 01/17/2019   Procedure: TOTAL KNEE ARTHROPLASTY;  Surgeon: Vickey Huger, MD;  Location: [facility];  Service: Orthopedics;  Laterality: Left;  75 mins needed for length of case. same day discharge  . VASECTOMY       Family History  Problem Relation Age of Onset  . Heart disease Mother   .  Alzheimer's disease Mother   . Heart attack Mother   . Prostate cancer Father 55  . Heart disease Brother 62       CABG  . Heart disease Brother        CABG     Social History   Tobacco Use  . Smoking status: Former Smoker    Packs/day: 1.00    Types: Cigarettes    Quit date: 1994    Years since quitting: 27.5  . Smokeless tobacco: Never Used  Substance Use Topics  . Alcohol use: Yes    Alcohol/week: 24.0 standard drinks    Types: 24 Cans of beer per week    With staff assistance, above reviewed with the patient today.  ROS: As per HPI, otherwise no specific complaints on a limited and focused system review   No results found for this or any previous visit (from the past 72 hour(s)).   PHQ2/9: Depression screen [facility] 2/9 09/01/2019 04/27/2019 03/04/2019 11/09/2018 10/08/2018  Decreased Interest 0 0 0 0 0  Down, Depressed, Hopeless 0 0 0 0 0  PHQ - 2 Score 0 0 0 0 0  Altered sleeping 0 0 3 1 -  Tired, decreased energy 0 0 0 0 -  Change in  appetite 0 0 0 0 -  Feeling bad or failure about yourself  0 0 0 0 -  Trouble concentrating 0 0 0 0 -  Moving slowly or fidgety/restless 0 0 0 0 -  Suicidal thoughts 0 0 0 0 -  PHQ-9 Score 0 0 3 1 -  Difficult doing work/chores Not difficult at all Not difficult at all Not difficult at all Somewhat difficult -   PHQ-2/9 Result is neg  Fall Risk: Fall Risk  09/01/2019 04/27/2019 03/04/2019 11/09/2018 10/08/2018  Falls in the past year? 0 0 0 0 0  Number falls in past yr: 0 0 0 0 0  Injury with Fall? 0 0 0 0 0  Follow up - - - - Falls prevention discussed      Objective:   Vitals:   09/01/19 0903  BP: 136/84  Pulse: 63  Resp: 16  Temp: 97.8 F (36.6 C)  TempSrc: Temporal  SpO2: 100%  Weight: 236 lb (107 kg)  Height: 5\' 10"  (1.778 m)    Body mass index is 33.86 kg/m.  Physical Exam   NAD, masked, pleasant HEENT - Morton/AT, sclera anicteric, PERRL, EOMI, conj - non-inj'ed, pharynx clear Neck - supple, no adenopathy, no TM, carotids 2+ and = without bruits bilat Car - RRR without m/g/r Pulm- RR and effort normal at rest, CTA without wheeze or rales Abd - soft, NT, mildly obese, ND, BS+,  no masses, no HSM Back - no CVA tenderness Ext -left knee with scar status post TKR, trace left lower extremity edema (he notes has persisted after the surgery), no right lower extremity edema Neuro/psychiatric - affect was not flat, appropriate with conversation  Alert and oriented  Grossly non-focal - good strength on testing extremities, sensation intact to LT in distal extremities, Romberg negative, no pronator drift  Speech and gait are normal   Results for orders placed or performed during the hospital encounter of 07/04/19  Comprehensive metabolic panel  Result Value Ref Range   Sodium 138 135 - 145 mmol/L   Potassium 4.1 3.5 - 5.1 mmol/L   Chloride 107 98 - 111 mmol/L   CO2 23 22 - 32 mmol/L   Glucose, Bld 140 (H) 70 - 99  mg/dL   BUN 15 8 - 23 mg/dL   Creatinine, Ser 0.90 0.61 -  1.24 mg/dL   Calcium 9.0 8.9 - 10.3 mg/dL   Total Protein 7.4 6.5 - 8.1 g/dL   Albumin 4.3 3.5 - 5.0 g/dL   AST 23 15 - 41 U/L   ALT 31 0 - 44 U/L   Alkaline Phosphatase 49 38 - 126 U/L   Total Bilirubin 0.5 0.3 - 1.2 mg/dL   GFR calc non Af Amer >60 >60 mL/min   GFR calc Af Amer >60 >60 mL/min   Anion gap 8 5 - 15  Lipase, blood  Result Value Ref Range   Lipase 21 11 - 51 U/L       Assessment & Plan:   1. Mixed hyperlipidemia Last lipid panel was good, on a statin (simvastatin-40 mg daily) We will recheck lipid panel today, Continue the statin presently Also dietary modifications and staying physically active important to help - Lipid panel - COMPLETE METABOLIC PANEL WITH GFR  2. Pre-diabetes Last A1c was just above the desired range in the prediabetic range We will recheck again today. - Hemoglobin A1c - COMPLETE METABOLIC PANEL WITH GFR  3. Obesity (BMI 35.0-39.9 without comorbidity) He has been doing a good job with weight maintenance, and does check his weight regularly. Discussed the importance of healthy weight maintenance today and continuing to eat healthy, stay physically active and monitoring weights important presently. - Hemoglobin A1c  4. BPH with obstruction/lower urinary tract symptoms Doing well with the finasteride, no increasing symptoms of concern recently Continue that medicine presently - PSA  5. Spondylosis of lumbar region without myelopathy or radiculopathy Still with intermittent back pains at times, although he noted he has had back surgery before, and almost expects that if he does overdo it at times.  Not limiting presently. He does remain on a chronic NSAID, although was able to cut it in half to 400 mg twice daily and noted at times if he can less than further would be helpful.  Did note the importance of taking it with food when he does take it.  6. Primary osteoarthritis involving multiple joints Continues with orthopedic follow-up  after his total knee replacement. His other knee has been doing fairly well recently, as it was thought he might need to have that knee done shortly after the first replacement. Continuing to monitor.  7. Gastroesophageal reflux disease without esophagitis Takes famotidine when he takes the ibuprofen, and helps lessen/prevent reflux symptoms. Can continue presently.  Also trying to minimize chronic NSAID use as he has been doing encouraged - CBC with Differential/Platelet - COMPLETE METABOLIC PANEL WITH GFR  8. Abdominal muscle strain, subsequent encounter Symptoms have improved, not completely resolved. Continue to monitor  9. Encounter for monitoring chronic NSAID therapy As above, was able to lessen the dose, and will check kidney function today. He remains on a famotidine product when he takes the ibuprofen  10. Screening for prostate cancer We will recheck a PSA with the current labs.  He does have a strong family history. - PSA  Await lab results today, and follow-up in approximately 6 months time, sooner as needed.     Towanda Malkin, MD 09/01/19 9:30 AM

## 2019-09-01 ENCOUNTER — Encounter: Payer: Self-pay | Admitting: Internal Medicine

## 2019-09-01 ENCOUNTER — Other Ambulatory Visit: Payer: Self-pay

## 2019-09-01 ENCOUNTER — Ambulatory Visit (INDEPENDENT_AMBULATORY_CARE_PROVIDER_SITE_OTHER): Payer: Medicare Other | Admitting: Internal Medicine

## 2019-09-01 VITALS — BP 136/84 | HR 63 | Temp 97.8°F | Resp 16 | Ht 70.0 in | Wt 236.0 lb

## 2019-09-01 DIAGNOSIS — N138 Other obstructive and reflux uropathy: Secondary | ICD-10-CM

## 2019-09-01 DIAGNOSIS — R7303 Prediabetes: Secondary | ICD-10-CM

## 2019-09-01 DIAGNOSIS — E669 Obesity, unspecified: Secondary | ICD-10-CM

## 2019-09-01 DIAGNOSIS — M159 Polyosteoarthritis, unspecified: Secondary | ICD-10-CM

## 2019-09-01 DIAGNOSIS — S39011D Strain of muscle, fascia and tendon of abdomen, subsequent encounter: Secondary | ICD-10-CM

## 2019-09-01 DIAGNOSIS — Z8042 Family history of malignant neoplasm of prostate: Secondary | ICD-10-CM | POA: Diagnosis not present

## 2019-09-01 DIAGNOSIS — Z791 Long term (current) use of non-steroidal anti-inflammatories (NSAID): Secondary | ICD-10-CM

## 2019-09-01 DIAGNOSIS — Z5181 Encounter for therapeutic drug level monitoring: Secondary | ICD-10-CM

## 2019-09-01 DIAGNOSIS — E782 Mixed hyperlipidemia: Secondary | ICD-10-CM

## 2019-09-01 DIAGNOSIS — M150 Primary generalized (osteo)arthritis: Secondary | ICD-10-CM

## 2019-09-01 DIAGNOSIS — Z125 Encounter for screening for malignant neoplasm of prostate: Secondary | ICD-10-CM

## 2019-09-01 DIAGNOSIS — K219 Gastro-esophageal reflux disease without esophagitis: Secondary | ICD-10-CM

## 2019-09-01 DIAGNOSIS — N401 Enlarged prostate with lower urinary tract symptoms: Secondary | ICD-10-CM | POA: Diagnosis not present

## 2019-09-01 DIAGNOSIS — M47816 Spondylosis without myelopathy or radiculopathy, lumbar region: Secondary | ICD-10-CM | POA: Diagnosis not present

## 2019-09-01 DIAGNOSIS — M8949 Other hypertrophic osteoarthropathy, multiple sites: Secondary | ICD-10-CM

## 2019-09-01 NOTE — Patient Instructions (Signed)

## 2019-09-02 LAB — CBC WITH DIFFERENTIAL/PLATELET
Absolute Monocytes: 738 cells/uL (ref 200–950)
Basophils Absolute: 81 cells/uL (ref 0–200)
Basophils Relative: 0.9 %
Eosinophils Absolute: 126 cells/uL (ref 15–500)
Eosinophils Relative: 1.4 %
HCT: 43.4 % (ref 38.5–50.0)
Hemoglobin: 14.9 g/dL (ref 13.2–17.1)
Lymphs Abs: 1800 cells/uL (ref 850–3900)
MCH: 31.4 pg (ref 27.0–33.0)
MCHC: 34.3 g/dL (ref 32.0–36.0)
MCV: 91.4 fL (ref 80.0–100.0)
MPV: 9.8 fL (ref 7.5–12.5)
Monocytes Relative: 8.2 %
Neutro Abs: 6255 cells/uL (ref 1500–7800)
Neutrophils Relative %: 69.5 %
Platelets: 200 10*3/uL (ref 140–400)
RBC: 4.75 10*6/uL (ref 4.20–5.80)
RDW: 14.1 % (ref 11.0–15.0)
Total Lymphocyte: 20 %
WBC: 9 10*3/uL (ref 3.8–10.8)

## 2019-09-02 LAB — COMPLETE METABOLIC PANEL WITH GFR
AG Ratio: 1.9 (calc) (ref 1.0–2.5)
ALT: 25 U/L (ref 9–46)
AST: 19 U/L (ref 10–35)
Albumin: 4.6 g/dL (ref 3.6–5.1)
Alkaline phosphatase (APISO): 55 U/L (ref 35–144)
BUN: 17 mg/dL (ref 7–25)
CO2: 22 mmol/L (ref 20–32)
Calcium: 9.6 mg/dL (ref 8.6–10.3)
Chloride: 111 mmol/L — ABNORMAL HIGH (ref 98–110)
Creat: 0.87 mg/dL (ref 0.70–1.25)
GFR, Est African American: 104 mL/min/{1.73_m2} (ref >=60)
GFR, Est Non African American: 89 mL/min/{1.73_m2} (ref >=60)
Globulin: 2.4 g/dL (calc) (ref 1.9–3.7)
Glucose, Bld: 103 mg/dL — ABNORMAL HIGH (ref 65–99)
Potassium: 4.5 mmol/L (ref 3.5–5.3)
Sodium: 142 mmol/L (ref 135–146)
Total Bilirubin: 0.4 mg/dL (ref 0.2–1.2)
Total Protein: 7 g/dL (ref 6.1–8.1)

## 2019-09-02 LAB — LIPID PANEL
Cholesterol: 170 mg/dL (ref <200)
HDL: 51 mg/dL (ref >=40)
LDL Cholesterol (Calc): 102 mg/dL (calc) — ABNORMAL HIGH
Non-HDL Cholesterol (Calc): 119 mg/dL (calc) (ref <130)
Total CHOL/HDL Ratio: 3.3 (calc) (ref <5.0)
Triglycerides: 83 mg/dL (ref <150)

## 2019-09-02 LAB — HEMOGLOBIN A1C
Hgb A1c MFr Bld: 5.5 % of total Hgb (ref <5.7)
Mean Plasma Glucose: 111 (calc)
eAG (mmol/L): 6.2 (calc)

## 2019-09-02 LAB — PSA: PSA: 0.2 ng/mL (ref <=4.0)

## 2019-10-05 ENCOUNTER — Other Ambulatory Visit: Payer: Self-pay | Admitting: Internal Medicine

## 2019-10-05 DIAGNOSIS — K219 Gastro-esophageal reflux disease without esophagitis: Secondary | ICD-10-CM

## 2019-10-07 DIAGNOSIS — Z85828 Personal history of other malignant neoplasm of skin: Secondary | ICD-10-CM | POA: Diagnosis not present

## 2019-10-07 DIAGNOSIS — D485 Neoplasm of uncertain behavior of skin: Secondary | ICD-10-CM | POA: Diagnosis not present

## 2019-10-07 DIAGNOSIS — L821 Other seborrheic keratosis: Secondary | ICD-10-CM | POA: Diagnosis not present

## 2019-10-07 DIAGNOSIS — L570 Actinic keratosis: Secondary | ICD-10-CM | POA: Diagnosis not present

## 2019-10-07 DIAGNOSIS — Z08 Encounter for follow-up examination after completed treatment for malignant neoplasm: Secondary | ICD-10-CM | POA: Diagnosis not present

## 2019-10-07 DIAGNOSIS — C44622 Squamous cell carcinoma of skin of right upper limb, including shoulder: Secondary | ICD-10-CM | POA: Diagnosis not present

## 2019-10-07 DIAGNOSIS — D0462 Carcinoma in situ of skin of left upper limb, including shoulder: Secondary | ICD-10-CM | POA: Diagnosis not present

## 2019-10-13 ENCOUNTER — Ambulatory Visit (INDEPENDENT_AMBULATORY_CARE_PROVIDER_SITE_OTHER): Payer: Medicare Other

## 2019-10-13 DIAGNOSIS — Z0000 Encounter for general adult medical examination without abnormal findings: Secondary | ICD-10-CM | POA: Diagnosis not present

## 2019-10-13 NOTE — Progress Notes (Signed)
Subjective:   Vincent Guerra is a 69 y.o. male who presents for Medicare Annual/Subsequent preventive examination.  Virtual Visit via Telephone Note  I connected with  Vincent Guerra on 10/13/19 at  9:20 AM EDT by telephone and verified that I am speaking with the correct person using two identifiers.  Medicare Annual Wellness visit completed telephonically due to Covid-19 pandemic.   Location: Patient: home Provider: [facility]   I discussed the limitations, risks, security and privacy concerns of performing an evaluation and management service by telephone and the availability of in person appointments. The patient expressed understanding and agreed to proceed.  Unable to perform video visit due to video visit attempted and failed and/or patient does not have video capability.   Some vital signs may be absent or patient reported.   [name], LPN    Review of Systems     Cardiac Risk Factors include: advanced age (>50men, >73 women);dyslipidemia;male gender;obesity (BMI >30kg/m2)     Objective:    There were no vitals filed for this visit. There is no height or weight on file to calculate BMI.  Advanced Directives 10/13/2019 01/17/2019 01/04/2019 10/08/2018 08/25/2017  Does Patient Have a Medical Advance Directive? No No No No Yes  Would patient like information on creating a medical advance directive? No - Patient declined No - Patient declined - Yes (MAU/Ambulatory/Procedural Areas - Information given) -    Current Medications (verified) Outpatient Encounter Medications as of 10/13/2019  Medication Sig  . aspirin 81 MG chewable tablet Chew by mouth daily.  . diclofenac sodium (VOLTAREN) 1 % GEL Apply 2 g topically 3 (three) times daily as needed. Use for hands and knee arthritis, joint pain (Patient taking differently: Apply 2 g topically 3 (three) times daily as needed (shoulder pain/hands & knee pain.). )  . famotidine (PEPCID) 20 MG tablet TAKE ONE TABLET BY MOUTH  TWICE DAILY AS NEEDED FOR HEARTBURN OR INDIGESTION  . finasteride (PROSCAR) 5 MG tablet TAKE ONE TABLET EVERY DAY  . fluticasone (FLONASE) 50 MCG/ACT nasal spray INHALE 2 SPRAYS IN EACH NOSTRIL EACH MORNING.  Marland Kitchen ibuprofen (IBU) 800 MG tablet Take 1 tablet (800 mg total) by mouth every 8 (eight) hours as needed. Take with food  . loratadine (CLARITIN) 10 MG tablet Take 10 mg by mouth 2 (two) times daily.   . Magnesium 500 MG TABS Take 500 mg by mouth at bedtime.  . Multiple Vitamin (MULTIVITAMIN WITH MINERALS) TABS tablet Take 1 tablet by mouth at bedtime.  . Omega-3 Fatty Acids (FISH OIL) 1200 MG CAPS Take 1,200 mg by mouth 2 (two) times daily.   . simvastatin (ZOCOR) 40 MG tablet TAKE ONE TABLET AT BEDTIME (Patient taking differently: Take 40 mg by mouth at bedtime. )  . Turmeric 500 MG CAPS Take 500 mg by mouth every evening.   No facility-administered encounter medications on file as of 10/13/2019.    Allergies (verified) Metronidazole and Tetanus toxoid adsorbed   History: Past Medical History:  Diagnosis Date  . Allergy   . Arthritis   . Diverticulosis   . GERD (gastroesophageal reflux disease)   . Hyperlipidemia    Past Surgical History:  Procedure Laterality Date  . BACK SURGERY  01/17/2015   Dr Harl Bowie [facility]), Multiple diskectomy and herniated disc  . CHOLECYSTECTOMY  07/07/2019  . TOTAL KNEE ARTHROPLASTY Left 01/17/2019   Procedure: TOTAL KNEE ARTHROPLASTY;  Surgeon: Vickey Huger, MD;  Location: [facility];  Service: Orthopedics;  Laterality: Left;  75 mins needed for length of case. same day discharge  . VASECTOMY     Family History  Problem Relation Age of Onset  . Heart disease Mother   . Alzheimer's disease Mother   . Heart attack Mother   . Prostate cancer Father 40  . Heart disease Brother 28       CABG  . Heart disease Brother        CABG   Social History   Socioeconomic History  . Marital status: Married    Spouse name: Blanch Media  . Number of children:  2  . Years of education: [employer]  . Highest education level: High school graduate  Occupational History  . 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 Smoking status: Former Smoker    Packs/day: 1.00    Types: Cigarettes    Quit date: 1994    Years since quitting: 27.6  . Smokeless tobacco: Never Used  Vaping Use  . Vaping Use: Never used  Substance and Sexual Activity  . Alcohol use: Yes    Alcohol/week: 24.0 standard drinks    Types: 24 Cans of beer per week  . Drug use: No  . Sexual activity: Not on file  Other Topics Concern  . Not on file  Social History Narrative  . Not on file   Social Determinants of Health   Financial Resource Strain: Low Risk   . Difficulty of Paying Living Expenses: Not hard at all  Food Insecurity: No Food Insecurity  . Worried About Charity fundraiser in the Last Year: Never true  . Ran Out of Food in the Last Year: Never true  Transportation Needs: No Transportation Needs  . Lack of Transportation (Medical): No  . Lack of Transportation (Non-Medical): No  Physical Activity: Inactive  . Days of Exercise per Week: 0 days  . Minutes of Exercise per Session: 0 min  Stress: No Stress Concern Present  . Feeling of Stress : Not at all  Social Connections: Moderately Isolated  . Frequency of Communication with Friends and Family: More than three times a week  . Frequency of Social Gatherings with Friends and Family: Twice a week  . Attends Religious Services: Never  . Active Member of Clubs or Organizations: No  . Attends Archivist Meetings: Never  . Marital Status: Married    Tobacco Counseling Counseling given: Not Answered   Clinical Intake:  Pre-visit preparation completed: Yes  Pain : No/denies pain     Nutritional Risks: None Diabetes: No  How often do you need to have someone help you when you read instructions, pamphlets, or other written materials from your doctor or  pharmacy?: 1 - Never    Interpreter Needed?: No  Information entered by :: [name] LPN   Activities of Daily Living In your present state of health, do you have any difficulty performing the following activities: 10/13/2019 09/01/2019  Hearing? N N  Comment declines hearing aids -  Vision? N N  Difficulty concentrating or making decisions? N N  Walking or climbing stairs? N N  Dressing or bathing? N N  Doing errands, shopping? N N  Preparing Food and eating ? N -  Using the Toilet? N -  In the past six months, have you accidently leaked urine? N -  Do you have problems with loss of bowel control? N -  Managing your Medications? N -  Managing your Finances? N -  Housekeeping  or managing your Housekeeping? N -  Some recent data might be hidden    Patient Care Team: Towanda Malkin, MD as PCP - General (Internal Medicine) Vickey Huger, MD as Consulting Physician (Orthopedic Surgery) Jenne Campus, MD as Referring Physician (Neurosurgery)  Indicate any recent Medical Services you may have received from other than [facility] providers in the past year (date may be approximate).     Assessment:   This is a routine wellness examination for Vincent Guerra.  Hearing/Vision screen  Hearing Screening   125Hz  250Hz  500Hz  1000Hz  2000Hz  3000Hz  4000Hz  6000Hz  8000Hz   Right ear:           Left ear:           Comments: Pt denies hearing difficulty  Vision Screening Comments: Annual vision screenings done at [facility] Dr. Wallace Going  Dietary issues and exercise activities discussed: Current Exercise Habits: The patient does not participate in regular exercise at present, Exercise limited by: orthopedic condition(s)  Goals    . Weight (lb) < 200 lb (90.7 kg)     patient wants to loose 10 pounds in 6 months.      Depression Screen PHQ 2/9 Scores 10/13/2019 09/01/2019 04/27/2019 03/04/2019 11/09/2018 10/08/2018 09/01/2018  PHQ - 2 Score 0 0 0 0 0 0 0  PHQ- 9 Score - 0 0 3 1 - 0      Fall Risk Fall Risk  10/13/2019 09/01/2019 04/27/2019 03/04/2019 11/09/2018  Falls in the past year? 0 0 0 0 0  Number falls in past yr: 0 0 0 0 0  Injury with Fall? 0 0 0 0 0  Risk for fall due to : No Fall Risks - - - -  Follow up Falls prevention discussed - - - -    Any stairs in or around the home? Yes  If so, are there any without handrails? No  Home free of loose throw rugs in walkways, pet beds, electrical cords, etc? Yes  Adequate lighting in your home to reduce risk of falls? Yes   ASSISTIVE DEVICES UTILIZED TO PREVENT FALLS:  Life alert? No  Use of a cane, walker or w/c? No  Grab bars in the bathroom? Yes  Shower chair or bench in shower? Yes  Elevated toilet seat or a handicapped toilet? Yes   TIMED UP AND GO:  Was the test performed? No . Telephonic visit   Cognitive Function: MMSE - Mini Mental State Exam 08/25/2017  Orientation to time 5  Orientation to Place 5  Registration 3  Attention/ Calculation 5  Recall 3  Language- name 2 objects 2  Language- repeat 1  Language- follow 3 step command 3  Language- read & follow direction 1  Write a sentence 1  Copy design 1  Total score 30     6CIT Screen 10/13/2019 10/08/2018  What Year? 0 points 0 points  What month? 0 points 0 points  What time? 0 points 0 points  Count back from 20 0 points 0 points  Months in reverse 0 points 0 points  Repeat phrase 0 points 0 points  Total Score 0 0    Immunizations Immunization History  Administered Date(s) Administered  . Fluad Quad(high Dose 65+) 11/09/2018  . Influenza-Unspecified 12/02/2017  . PFIZER SARS-COV-2 Vaccination 03/24/2019, 04/14/2019  . Pneumococcal Conjugate-13 03/02/2017  . Pneumococcal Polysaccharide-23 09/01/2018   Tdap status: n/a pt allergic to tetanus  Flu vaccine status:  DUE- pt aware may receive at local pharmacy or in office  Pneumococcal vaccine status: Up to date   Covid-19 vaccine status: Completed vaccines  Qualifies for  Shingles Vaccine? Yes   Zostavax completed No   Shingrix Completed?: No.    Education has been provided regarding the importance of this vaccine. Patient has been advised to call insurance company to determine out of pocket expense if they have not yet received this vaccine. Advised may also receive vaccine at local pharmacy or Health Dept. Verbalized acceptance and understanding.  Screening Tests Health Maintenance  Topic Date Due  . INFLUENZA VACCINE  09/11/2019  . TETANUS/TDAP  08/23/2026 (Originally 09/06/1969)  . COLONOSCOPY  02/10/2021  . COVID-19 Vaccine  Completed  . Hepatitis C Screening  Completed  . PNA vac Low Risk Adult  Completed    Health Maintenance  Health Maintenance Due  Topic Date Due  . INFLUENZA VACCINE  09/11/2019    Colorectal cancer screening: Completed 2013. Repeat every 10 years  Lung Cancer Screening: (Low Dose CT Chest recommended if Age 76-80 years, 30 pack-year currently smoking OR have quit w/in 15years.) does not qualify.   Additional Screening:  Hepatitis C Screening: does qualify; Completed 07/29/17  Vision Screening: Recommended annual ophthalmology exams for early detection of glaucoma and other disorders of the eye. Is the patient up to date with their annual eye exam?  Yes  Who is the provider or what is the name of the office in which the patient attends annual eye exams? [place] Screening: Recommended annual dental exams for proper oral hygiene  Community Resource Referral / Chronic Care Management: CRR required this visit?  No   CCM required this visit?  No      Plan:     I have personally reviewed and noted the following in the patient's chart:   . Medical and social history . Use of alcohol, tobacco or illicit drugs  . Current medications and supplements . Functional ability and status . Nutritional status . Physical activity . Advanced directives . List of other physicians . Hospitalizations,  surgeries, and ER visits in previous 12 months . Vitals . Screenings to include cognitive, depression, and falls . Referrals and appointments  In addition, I have reviewed and discussed with patient certain preventive protocols, quality metrics, and best practice recommendations. A written personalized care plan for preventive services as well as general preventive health recommendations were provided to patient.     [name], LPN   02/15/1094   Nurse Notes: none

## 2019-10-13 NOTE — Patient Instructions (Signed)
Vincent Guerra , Thank you for taking time to come for your Medicare Wellness Visit. I appreciate your ongoing commitment to your health goals. Please review the following plan we discussed and let me know if I can assist you in the future.   Screening recommendations/referrals: Colonoscopy: done 2013. Repeat in 2023. Recommended yearly ophthalmology/optometry visit for glaucoma screening and checkup Recommended yearly dental visit for hygiene and checkup  Vaccinations: Influenza vaccine: done 11/09/18 Pneumococcal vaccine: done 09/01/18 Shingles vaccine: Shingrix discussed. Please contact your pharmacy for coverage information.  Covid-19: done 03/24/19 & 04/14/19  Advanced directives: Advance directive discussed with you today. Even though you declined this today please call our office should you change your mind and we can give you the proper paperwork for you to fill out.  Conditions/risks identified: Recommend healthy eating and physical activity for desired weight loss  Next appointment: Follow up in one year for your annual wellness visit.   Preventive Care 69 Years and Older, Male Preventive care refers to lifestyle choices and visits with your health care provider that can promote health and wellness. What does preventive care include?  A yearly physical exam. This is also called an annual well check.  Dental exams once or twice a year.  Routine eye exams. Ask your health care provider how often you should have your eyes checked.  Personal lifestyle choices, including:  Daily care of your teeth and gums.  Regular physical activity.  Eating a healthy diet.  Avoiding tobacco and drug use.  Limiting alcohol use.  Practicing safe sex.  Taking low doses of aspirin every day.  Taking vitamin and mineral supplements as recommended by your health care provider. What happens during an annual well check? The services and screenings done by your health care provider during your  annual well check will depend on your age, overall health, lifestyle risk factors, and family history of disease. Counseling  Your health care provider may ask you questions about your:  Alcohol use.  Tobacco use.  Drug use.  Emotional well-being.  Home and relationship well-being.  Sexual activity.  Eating habits.  History of falls.  Memory and ability to understand (cognition).  Work and work Statistician. Screening  You may have the following tests or measurements:  Height, weight, and BMI.  Blood pressure.  Lipid and cholesterol levels. These may be checked every 5 years, or more frequently if you are over 64 years old.  Skin check.  Lung cancer screening. You may have this screening every year starting at age 42 if you have a 30-pack-year history of smoking and currently smoke or have quit within the past 15 years.  Fecal occult blood test (FOBT) of the stool. You may have this test every year starting at age 51.  Flexible sigmoidoscopy or colonoscopy. You may have a sigmoidoscopy every 5 years or a colonoscopy every 10 years starting at age 57.  Prostate cancer screening. Recommendations will vary depending on your family history and other risks.  Hepatitis C blood test.  Hepatitis B blood test.  Sexually transmitted disease (STD) testing.  Diabetes screening. This is done by checking your blood sugar (glucose) after you have not eaten for a while (fasting). You may have this done every 1-3 years.  Abdominal aortic aneurysm (AAA) screening. You may need this if you are a current or former smoker.  Osteoporosis. You may be screened starting at age 73 if you are at high risk. Talk with your health care provider about your test  results, treatment options, and if necessary, the need for more tests. Vaccines  Your health care provider may recommend certain vaccines, such as:  Influenza vaccine. This is recommended every year.  Tetanus, diphtheria, and  acellular pertussis (Tdap, Td) vaccine. You may need a Td booster every 10 years.  Zoster vaccine. You may need this after age 63.  Pneumococcal 13-valent conjugate (PCV13) vaccine. One dose is recommended after age 41.  Pneumococcal polysaccharide (PPSV23) vaccine. One dose is recommended after age 56. Talk to your health care provider about which screenings and vaccines you need and how often you need them. This information is not intended to replace advice given to you by your health care provider. Make sure you discuss any questions you have with your health care provider. Document Released: 02/23/2015 Document Revised: 10/17/2015 Document Reviewed: 11/28/2014 Elsevier Interactive Patient Education  2017 [employer] Prevention in the Home Falls can cause injuries. They can happen to people of all ages. There are many things you can do to make your home safe and to help prevent falls. What can I do on the outside of my home?  Regularly fix the edges of walkways and driveways and fix any cracks.  Remove anything that might make you trip as you walk through a door, such as a raised step or threshold.  Trim any bushes or trees on the path to your home.  Use bright outdoor lighting.  Clear any walking paths of anything that might make someone trip, such as rocks or tools.  Regularly check to see if handrails are loose or broken. Make sure that both sides of any steps have handrails.  Any raised decks and porches should have guardrails on the edges.  Have any leaves, snow, or ice cleared regularly.  Use sand or salt on walking paths during winter.  Clean up any spills in your garage right away. This includes oil or grease spills. What can I do in the bathroom?  Use night lights.  Install grab bars by the toilet and in the tub and shower. Do not use towel bars as grab bars.  Use non-skid mats or decals in the tub or shower.  If you need to sit down in the shower, use  a plastic, non-slip stool.  Keep the floor dry. Clean up any water that spills on the floor as soon as it happens.  Remove soap buildup in the tub or shower regularly.  Attach bath mats securely with double-sided non-slip rug tape.  Do not have throw rugs and other things on the floor that can make you trip. What can I do in the bedroom?  Use night lights.  Make sure that you have a light by your bed that is easy to reach.  Do not use any sheets or blankets that are too big for your bed. They should not hang down onto the floor.  Have a firm chair that has side arms. You can use this for support while you get dressed.  Do not have throw rugs and other things on the floor that can make you trip. What can I do in the kitchen?  Clean up any spills right away.  Avoid walking on wet floors.  Keep items that you use a lot in easy-to-reach places.  If you need to reach something above you, use a strong step stool that has a grab bar.  Keep electrical cords out of the way.  Do not use floor polish or wax that makes  floors slippery. If you must use wax, use non-skid floor wax.  Do not have throw rugs and other things on the floor that can make you trip. What can I do with my stairs?  Do not leave any items on the stairs.  Make sure that there are handrails on both sides of the stairs and use them. Fix handrails that are broken or loose. Make sure that handrails are as long as the stairways.  Check any carpeting to make sure that it is firmly attached to the stairs. Fix any carpet that is loose or worn.  Avoid having throw rugs at the top or bottom of the stairs. If you do have throw rugs, attach them to the floor with carpet tape.  Make sure that you have a light switch at the top of the stairs and the bottom of the stairs. If you do not have them, ask someone to add them for you. What else can I do to help prevent falls?  Wear shoes that:  Do not have high heels.  Have  rubber bottoms.  Are comfortable and fit you well.  Are closed at the toe. Do not wear sandals.  If you use a stepladder:  Make sure that it is fully opened. Do not climb a closed stepladder.  Make sure that both sides of the stepladder are locked into place.  Ask someone to hold it for you, if possible.  Clearly mark and make sure that you can see:  Any grab bars or handrails.  First and last steps.  Where the edge of each step is.  Use tools that help you move around (mobility aids) if they are needed. These include:  Canes.  Walkers.  Scooters.  Crutches.  Turn on the lights when you go into a dark area. Replace any light bulbs as soon as they burn out.  Set up your furniture so you have a clear path. Avoid moving your furniture around.  If any of your floors are uneven, fix them.  If there are any pets around you, be aware of where they are.  Review your medicines with your doctor. Some medicines can make you feel dizzy. This can increase your chance of falling. Ask your doctor what other things that you can do to help prevent falls. This information is not intended to replace advice given to you by your health care provider. Make sure you discuss any questions you have with your health care provider. Document Released: 11/23/2008 Document Revised: 07/05/2015 Document Reviewed: 03/03/2014 Elsevier Interactive Patient Education  2017 [employer].

## 2019-11-18 DIAGNOSIS — C44622 Squamous cell carcinoma of skin of right upper limb, including shoulder: Secondary | ICD-10-CM | POA: Diagnosis not present

## 2019-11-18 DIAGNOSIS — C44612 Basal cell carcinoma of skin of right upper limb, including shoulder: Secondary | ICD-10-CM | POA: Diagnosis not present

## 2019-12-02 DIAGNOSIS — D0462 Carcinoma in situ of skin of left upper limb, including shoulder: Secondary | ICD-10-CM | POA: Diagnosis not present

## 2019-12-21 ENCOUNTER — Other Ambulatory Visit: Payer: Self-pay

## 2019-12-21 DIAGNOSIS — E782 Mixed hyperlipidemia: Secondary | ICD-10-CM

## 2019-12-21 MED ORDER — SIMVASTATIN 40 MG PO TABS: 40.0000 mg | ORAL_TABLET | Freq: Every day | ORAL | 1 refills | Status: DC

## 2020-01-12 DIAGNOSIS — Z96652 Presence of left artificial knee joint: Secondary | ICD-10-CM | POA: Diagnosis not present

## 2020-02-16 DIAGNOSIS — M19012 Primary osteoarthritis, left shoulder: Secondary | ICD-10-CM | POA: Diagnosis not present

## 2020-02-16 DIAGNOSIS — M19011 Primary osteoarthritis, right shoulder: Secondary | ICD-10-CM | POA: Diagnosis not present

## 2020-02-26 NOTE — Progress Notes (Signed)
Patient ID: Vincent Guerra, male    DOB: [birth year], 70 y.o.   MRN: [id number]  PCP: Towanda Malkin, MD  Chief Complaint  Patient presents with  . Follow-up    Subjective:   Vincent Guerra is a 70 y.o. male, presents to clinic with CC of the following:  Chief Complaint  Patient presents with  . Follow-up    HPI:  Patient is a 70 year old male Last visit with me was 09/01/19 Communication from labs returned after that last visit was as follows:  The complete blood count was entirely normal. The complete metabolic panel showed just a very slightly elevated glucose at 103 and was otherwise okay, with his kidney function tests and liver function tests remaining good. Hemoglobin A1c was better at 5.5, no longer in the prediabetes range. The PSA was good at 0.2. The lipid panel showed that the LDL cholesterol (lousy type) was higher at 102, slightly above the desired range of less than 100. (Was 66 a year ago). Total cholesterol was also slightly higher at 170 (was 149 a year ago). He should continue his current statin Follows up today. Is here today with his wife   All in all, no complaints noted.  LEFT abdominal pain:December 10 he fell walking to the bathroom after having knee surgery and using a walker. The pain started after this strain injury, and symptoms have continued intermittently. He did have an ultrasound done which was unremarkable.  From my assessment in March, did feel like he had an abdominal muscle strain, and was slowly improving,   He notes has continued to remain better.    Hyperlipidemia Medication regimen-simvatatin & fish oilsdaily,  Takes medication as directed.   Lab Results  Component Value Date   CHOL 170 09/01/2019   HDL 51 09/01/2019   LDLCALC 102 (H) 09/01/2019   TRIG 83 09/01/2019   CHOLHDL 3.3 09/01/2019    Denies myalgias, denies chest pain, palpitations, shortness of breath, no increased lower extremity  swelling,  Pre-diabetes/Obesity Lab Results  Component Value Date   HGBA1C 5.5 09/01/2019   HGBA1C 5.8 (H) 01/04/2019   HGBA1C 5.6 11/09/2018   Lab Results  Component Value Date   LDLCALC 102 (H) 09/01/2019   CREATININE 0.87 09/01/2019    Last A1C normal  Wt Readings from Last 3 Encounters:  03/01/20 246 lb 12.8 oz (111.9 kg)  09/01/19 236 lb (107 kg)  07/04/19 235 lb (106.6 kg)   Weight increased with more clothes with weight today noted.  He states he was 239 at home this morning without clothes.  Admitted has gained a few pounds. Trying to eat a healthier diet,,  "Trying to eat less",  Denies polyuria, polydipsia.   BPH Medication regimen-finasteride 5mg  daily.  Wakes up 1-2 times at night to pee. No change recent past States on medication he doesn't dribble anymore and can feel complete bladder emptying.  Spondylosis of lumbar regionand left knee pain/OA Medication regimen-ibuprofen400mg  twice daily (lessened to 1/2 of 800mg ), although at times does take 800 mg twice daily. States he rarely cannot function when he does not take it- discussed monitoring kidney function and caution with chronic NSAID use.  Had LEFT TKR done in December 2020 and recovered well from this. Has not had recent follow-up with his previous back surgeon.  Back still has some intermittent aches when overdoes it.  Gastroesophageal reflux disease without esophagitis Medication regimen-famotidineBID every day with ibuprofen (not having GERD sx's) On chronic  NSAIDs.  Denies chronic abdominal pains, dark or black stools, bleeding per rectum, problems swallowing or nausea or vomiting.  Prostate cancer screening- Positive family history noted with both dad and grandfather having prostate cancer He has been following PSAs to date  Lab Results  Component Value Date   PSA 0.2 09/01/2019        Patient Active Problem List   Diagnosis Date Noted  . Family history of prostate  cancer in father 09/01/2019  . Leukocytosis 07/05/2019  . Calculus of gallbladder without cholecystitis without obstruction 07/05/2019  . Encounter for monitoring chronic NSAID therapy 04/27/2019  . Abdominal muscle strain, subsequent encounter 04/27/2019  . GERD (gastroesophageal reflux disease) 08/31/2018  . Allergic rhinitis 03/11/2018  . Alcohol use 03/11/2018  . Obesity (BMI 35.0-39.9 without comorbidity) 08/03/2017  . Environmental and seasonal allergies 08/22/2016  . Diverticulosis 08/22/2016  . BPH with obstruction/lower urinary tract symptoms 08/22/2016  . Pre-diabetes 08/22/2016  . Osteoarthritis of multiple joints 08/22/2016  . Degenerative joint disease (DJD) of lumbar spine 08/22/2016  . History of sciatica 08/22/2016  . Hyperlipidemia 08/22/2016      Current Outpatient Medications:  .  aspirin 81 MG chewable tablet, Chew by mouth daily., Disp: , Rfl:  .  diclofenac sodium (VOLTAREN) 1 % GEL, Apply 2 g topically 3 (three) times daily as needed. Use for hands and knee arthritis, joint pain (Patient taking differently: Apply 2 g topically 3 (three) times daily as needed (shoulder pain/hands & knee pain.).), Disp: 100 g, Rfl: 2 .  famotidine (PEPCID) 20 MG tablet, TAKE ONE TABLET BY MOUTH TWICE DAILY AS NEEDED FOR HEARTBURN OR INDIGESTION, Disp: 180 tablet, Rfl: 3 .  finasteride (PROSCAR) 5 MG tablet, TAKE ONE TABLET EVERY DAY, Disp: 90 tablet, Rfl: 3 .  fluticasone (FLONASE) 50 MCG/ACT nasal spray, INHALE 2 SPRAYS IN EACH NOSTRIL EACH MORNING., Disp: 16 g, Rfl: 10 .  ibuprofen (IBU) 800 MG tablet, Take 1 tablet (800 mg total) by mouth every 8 (eight) hours as needed. Take with food, Disp: 270 tablet, Rfl: 0 .  loratadine (CLARITIN) 10 MG tablet, Take 10 mg by mouth 2 (two) times daily. , Disp: , Rfl:  .  Magnesium 500 MG TABS, Take 500 mg by mouth at bedtime., Disp: , Rfl:  .  Multiple Vitamin (MULTIVITAMIN WITH MINERALS) TABS tablet, Take 1 tablet by mouth at bedtime., Disp:  , Rfl:  .  Omega-3 Fatty Acids (FISH OIL) 1200 MG CAPS, Take 1,200 mg by mouth 2 (two) times daily. , Disp: , Rfl:  .  simvastatin (ZOCOR) 40 MG tablet, Take 1 tablet (40 mg total) by mouth at bedtime., Disp: 90 tablet, Rfl: 1 .  Turmeric 500 MG CAPS, Take 500 mg by mouth every evening., Disp: , Rfl:    Allergies  Allergen Reactions  . Metronidazole Hives  . Tetanus Toxoid Adsorbed Hives     Past Surgical History:  Procedure Laterality Date  . BACK SURGERY  01/17/2015   Dr Harl Bowie [facility]), Multiple diskectomy and herniated disc  . CHOLECYSTECTOMY  07/07/2019  . TOTAL KNEE ARTHROPLASTY Left 01/17/2019   Procedure: TOTAL KNEE ARTHROPLASTY;  Surgeon: Vickey Huger, MD;  Location: [facility];  Service: Orthopedics;  Laterality: Left;  75 mins needed for length of case. same day discharge  . VASECTOMY       Family History  Problem Relation Age of Onset  . Heart disease Mother   . Alzheimer's disease Mother   . Heart attack Mother   .  Prostate cancer Father 41  . Heart disease Brother 76       CABG  . Heart disease Brother        CABG     Social History   Tobacco Use  . Smoking status: Former Smoker    Packs/day: 1.00    Types: Cigarettes    Quit date: 1994    Years since quitting: 28.0  . Smokeless tobacco: Never Used  Substance Use Topics  . Alcohol use: Yes    Alcohol/week: 24.0 standard drinks    Types: 24 Cans of beer per week    With staff assistance, above reviewed with the patient today.  ROS: As per HPI, otherwise no specific complaints on a limited and focused system review   No results found for this or any previous visit (from the past 72 hour(s)).   PHQ2/9: Depression screen [facility] 2/9 03/01/2020 10/13/2019 09/01/2019 04/27/2019 03/04/2019  Decreased Interest 0 0 0 0 0  Down, Depressed, Hopeless 0 0 0 0 0  PHQ - 2 Score 0 0 0 0 0  Altered sleeping - - 0 0 3  Tired, decreased energy - - 0 0 0  Change in appetite - - 0 0 0  Feeling bad or failure about  yourself  - - 0 0 0  Trouble concentrating - - 0 0 0  Moving slowly or fidgety/restless - - 0 0 0  Suicidal thoughts - - 0 0 0  PHQ-9 Score - - 0 0 3  Difficult doing work/chores - - Not difficult at all Not difficult at all Not difficult at all   PHQ-2/9 Result is neg  Fall Risk: Fall Risk  03/01/2020 10/13/2019 09/01/2019 04/27/2019 03/04/2019  Falls in the past year? 0 0 0 0 0  Number falls in past yr: 0 0 0 0 0  Injury with Fall? 0 0 0 0 0  Risk for fall due to : - No Fall Risks - - -  Follow up - Falls prevention discussed - - -      Objective:   Vitals:   03/01/20 0927  BP: 120/82  Pulse: 71  Resp: 16  Temp: 98.8 F (37.1 C)  TempSrc: Oral  SpO2: 96%  Weight: 246 lb 12.8 oz (111.9 kg)  Height: 5\' 10"  (1.778 m)    Body mass index is 35.41 kg/m.  Physical Exam   NAD, masked, pleasant HEENT - [place]/AT, sclera anicteric, PERRL, EOMI, conj - non-inj'ed, pharynx clear Neck - supple, no adenopathy, no TM, carotids 2+ and = without bruits bilat Car - RRR without m/g/r Pulm- RR and effort normal at rest, CTA without wheeze or rales Abd - soft, NT diffusely, mildly obese,  Back - no CVA tenderness Ext - trace left lower extremity edema (he notes has persisted after the surgery), no right lower extremity edema Neuro/psychiatric - affect was not flat, appropriate with conversation             Alert              Grossly non-focal              Speech normal    Results for orders placed or performed in visit on 09/01/19  CBC with Differential/Platelet  Result Value Ref Range   WBC 9.0 3.8 - 10.8 Thousand/uL   RBC 4.75 4.20 - 5.80 Million/uL   Hemoglobin 14.9 13.2 - 17.1 g/dL   HCT 43.4 38.5 - 50.0 %   MCV 91.4 80.0 -  100.0 fL   MCH 31.4 27.0 - 33.0 pg   MCHC 34.3 32.0 - 36.0 g/dL   RDW 14.1 11.0 - 15.0 %   Platelets 200 140 - 400 Thousand/uL   MPV 9.8 7.5 - 12.5 fL   Neutro Abs 6,255 1,500 - 7,800 cells/uL   Lymphs Abs 1,800 850 - 3,900 cells/uL   Absolute Monocytes  738 200 - 950 cells/uL   Eosinophils Absolute 126 15 - 500 cells/uL   Basophils Absolute 81 0 - 200 cells/uL   Neutrophils Relative % 69.5 %   Total Lymphocyte 20.0 %   Monocytes Relative 8.2 %   Eosinophils Relative 1.4 %   Basophils Relative 0.9 %  Lipid panel  Result Value Ref Range   Cholesterol 170 <200 mg/dL   HDL 51 > OR = 40 mg/dL   Triglycerides 83 <150 mg/dL   LDL Cholesterol (Calc) 102 (H) mg/dL (calc)   Total CHOL/HDL Ratio 3.3 <5.0 (calc)   Non-HDL Cholesterol (Calc) 119 <130 mg/dL (calc)  Hemoglobin A1c  Result Value Ref Range   Hgb A1c MFr Bld 5.5 <5.7 % of total Hgb   Mean Plasma Glucose 111 (calc)   eAG (mmol/L) 6.2 (calc)  PSA  Result Value Ref Range   PSA 0.2 < OR = 4.0 ng/mL  COMPLETE METABOLIC PANEL WITH GFR  Result Value Ref Range   Glucose, Bld 103 (H) 65 - 99 mg/dL   BUN 17 7 - 25 mg/dL   Creat 0.87 0.70 - 1.25 mg/dL   GFR, Est Non African American 89 > OR = 60 mL/min/1.22m2   GFR, Est African American 104 > OR = 60 mL/min/1.38m2   BUN/Creatinine Ratio NOT APPLICABLE 6 - 22 (calc)   Sodium 142 135 - 146 mmol/L   Potassium 4.5 3.5 - 5.3 mmol/L   Chloride 111 (H) 98 - 110 mmol/L   CO2 22 20 - 32 mmol/L   Calcium 9.6 8.6 - 10.3 mg/dL   Total Protein 7.0 6.1 - 8.1 g/dL   Albumin 4.6 3.6 - 5.1 g/dL   Globulin 2.4 1.9 - 3.7 g/dL (calc)   AG Ratio 1.9 1.0 - 2.5 (calc)   Total Bilirubin 0.4 0.2 - 1.2 mg/dL   Alkaline phosphatase (APISO) 55 35 - 144 U/L   AST 19 10 - 35 U/L   ALT 25 9 - 46 U/L   Last labs reviewed    Assessment & Plan:    1. Mixed hyperlipidemia Last lipid panel reviewed Remains on a statin and to continue Also dietary modifications and staying physically active important to help  2. Pre-diabetes Last A1C normal Did not feel need to recheck that today.  3. Obesity (BMI 35.0-39.9 without comorbidity) Emphasized the importance of healthy weight maintenance, with his weight slightly up today.  He notes he is trying to lose  weight again, lessening p.o. intake presently He continues to check his weights pretty regularly to help follow.  4. BPH with obstruction/lower urinary tract symptoms Doing well with the finasteride, no increasing symptoms of concern recently Continue that medicine presently  5. Spondylosis of lumbar region without myelopathy or radiculopathy Still with intermittent back pains at times, although he noted he has had back surgery before, and almost expects that if he does overdo it at times.  Not limiting presently. He does remain on a chronic NSAID, although was able to cut it in half to 400 mg twice daily, although at times she does take the 800 mg twice daily.  Encouraged trying to use the 400 mg twice daily as much as possible.  Did note the importance of taking it with food when he does take it.  6. Primary osteoarthritis involving multiple joints Continues with orthopedic follow-up after his total knee replacement. His other knee has been doing fairly well recently, as it was thought he might need to have that knee done shortly after the first replacement. Continuing to monitor.  7. Gastroesophageal reflux disease without esophagitis Takes famotidine when he takes the ibuprofen, and helps lessen/prevent reflux symptoms. Can continue presently.  Also trying to minimize chronic NSAID use as he has been doing encouraged  8. Abdominal muscle strain, subsequent encounter Symptoms remain improved.  9. Encounter for monitoring chronic NSAID therapy As above, was able to lessen the dose some. Kidney function has remained stable, continue to follow. He remains on a famotidine product when he takes the ibuprofen  10. Screening for prostate cancer He does have a strong family history. Following PSA's, have been good.  He is going to get a flu shot today.  follow-up in approximately 6 months time, sooner as needed. Recheck of labs on f/u visit planned He is aware his f/u visit  will be with a new provider as I am leaving the practice sooner than his planned f/u.       Towanda Malkin, MD 03/01/20 9:57 AM

## 2020-03-01 ENCOUNTER — Other Ambulatory Visit: Payer: Self-pay

## 2020-03-01 ENCOUNTER — Ambulatory Visit (INDEPENDENT_AMBULATORY_CARE_PROVIDER_SITE_OTHER): Payer: Medicare Other | Admitting: Internal Medicine

## 2020-03-01 ENCOUNTER — Encounter: Payer: Self-pay | Admitting: Internal Medicine

## 2020-03-01 VITALS — BP 120/82 | HR 71 | Temp 98.8°F | Resp 16 | Ht 70.0 in | Wt 246.8 lb

## 2020-03-01 DIAGNOSIS — N401 Enlarged prostate with lower urinary tract symptoms: Secondary | ICD-10-CM

## 2020-03-01 DIAGNOSIS — Z791 Long term (current) use of non-steroidal anti-inflammatories (NSAID): Secondary | ICD-10-CM

## 2020-03-01 DIAGNOSIS — S39011D Strain of muscle, fascia and tendon of abdomen, subsequent encounter: Secondary | ICD-10-CM

## 2020-03-01 DIAGNOSIS — E669 Obesity, unspecified: Secondary | ICD-10-CM

## 2020-03-01 DIAGNOSIS — M8949 Other hypertrophic osteoarthropathy, multiple sites: Secondary | ICD-10-CM | POA: Diagnosis not present

## 2020-03-01 DIAGNOSIS — M47816 Spondylosis without myelopathy or radiculopathy, lumbar region: Secondary | ICD-10-CM

## 2020-03-01 DIAGNOSIS — Z5181 Encounter for therapeutic drug level monitoring: Secondary | ICD-10-CM

## 2020-03-01 DIAGNOSIS — N138 Other obstructive and reflux uropathy: Secondary | ICD-10-CM

## 2020-03-01 DIAGNOSIS — Z23 Encounter for immunization: Secondary | ICD-10-CM

## 2020-03-01 DIAGNOSIS — E782 Mixed hyperlipidemia: Secondary | ICD-10-CM

## 2020-03-01 DIAGNOSIS — Z125 Encounter for screening for malignant neoplasm of prostate: Secondary | ICD-10-CM

## 2020-03-01 DIAGNOSIS — K219 Gastro-esophageal reflux disease without esophagitis: Secondary | ICD-10-CM

## 2020-03-01 DIAGNOSIS — R7303 Prediabetes: Secondary | ICD-10-CM

## 2020-03-01 DIAGNOSIS — M159 Polyosteoarthritis, unspecified: Secondary | ICD-10-CM

## 2020-03-02 ENCOUNTER — Ambulatory Visit: Payer: Medicare Other | Admitting: Internal Medicine

## 2020-03-19 DIAGNOSIS — M7061 Trochanteric bursitis, right hip: Secondary | ICD-10-CM | POA: Diagnosis not present

## 2020-03-20 DIAGNOSIS — M7061 Trochanteric bursitis, right hip: Secondary | ICD-10-CM | POA: Diagnosis not present

## 2020-03-27 DIAGNOSIS — D2261 Melanocytic nevi of right upper limb, including shoulder: Secondary | ICD-10-CM | POA: Diagnosis not present

## 2020-03-27 DIAGNOSIS — L821 Other seborrheic keratosis: Secondary | ICD-10-CM | POA: Diagnosis not present

## 2020-03-27 DIAGNOSIS — D2262 Melanocytic nevi of left upper limb, including shoulder: Secondary | ICD-10-CM | POA: Diagnosis not present

## 2020-03-27 DIAGNOSIS — Z85828 Personal history of other malignant neoplasm of skin: Secondary | ICD-10-CM | POA: Diagnosis not present

## 2020-03-27 DIAGNOSIS — X32XXXA Exposure to sunlight, initial encounter: Secondary | ICD-10-CM | POA: Diagnosis not present

## 2020-03-27 DIAGNOSIS — D2272 Melanocytic nevi of left lower limb, including hip: Secondary | ICD-10-CM | POA: Diagnosis not present

## 2020-03-27 DIAGNOSIS — L570 Actinic keratosis: Secondary | ICD-10-CM | POA: Diagnosis not present

## 2020-03-27 DIAGNOSIS — M71341 Other bursal cyst, right hand: Secondary | ICD-10-CM | POA: Diagnosis not present

## 2020-04-02 DIAGNOSIS — M7631 Iliotibial band syndrome, right leg: Secondary | ICD-10-CM | POA: Diagnosis not present

## 2020-04-02 DIAGNOSIS — M7071 Other bursitis of hip, right hip: Secondary | ICD-10-CM | POA: Diagnosis not present

## 2020-04-04 ENCOUNTER — Other Ambulatory Visit: Payer: Self-pay

## 2020-04-04 DIAGNOSIS — N401 Enlarged prostate with lower urinary tract symptoms: Secondary | ICD-10-CM

## 2020-04-04 DIAGNOSIS — N138 Other obstructive and reflux uropathy: Secondary | ICD-10-CM

## 2020-04-09 ENCOUNTER — Other Ambulatory Visit: Payer: Self-pay

## 2020-04-09 MED ORDER — FINASTERIDE 5 MG PO TABS: 5.0000 mg | ORAL_TABLET | Freq: Every day | ORAL | 3 refills | Status: DC

## 2020-05-09 ENCOUNTER — Other Ambulatory Visit: Payer: Self-pay | Admitting: Family Medicine

## 2020-06-05 ENCOUNTER — Other Ambulatory Visit: Payer: Self-pay

## 2020-06-05 MED ORDER — FLUTICASONE PROPIONATE 50 MCG/ACT NA SUSP: NASAL | 0 refills | Status: DC

## 2020-06-05 NOTE — Telephone Encounter (Signed)
Last appt:1/22

## 2020-07-11 ENCOUNTER — Other Ambulatory Visit: Payer: Self-pay

## 2020-07-11 DIAGNOSIS — M159 Polyosteoarthritis, unspecified: Secondary | ICD-10-CM

## 2020-07-11 DIAGNOSIS — M8949 Other hypertrophic osteoarthropathy, multiple sites: Secondary | ICD-10-CM

## 2020-07-11 DIAGNOSIS — M47816 Spondylosis without myelopathy or radiculopathy, lumbar region: Secondary | ICD-10-CM

## 2020-07-11 NOTE — Telephone Encounter (Signed)
Called patient and lvm that needs an appt for medrefill request

## 2020-07-17 ENCOUNTER — Other Ambulatory Visit: Payer: Self-pay | Admitting: Family Medicine

## 2020-07-19 DIAGNOSIS — M7061 Trochanteric bursitis, right hip: Secondary | ICD-10-CM | POA: Diagnosis not present

## 2020-07-19 DIAGNOSIS — M25551 Pain in right hip: Secondary | ICD-10-CM | POA: Diagnosis not present

## 2020-07-19 DIAGNOSIS — G8929 Other chronic pain: Secondary | ICD-10-CM | POA: Diagnosis not present

## 2020-08-27 ENCOUNTER — Encounter: Payer: Self-pay | Admitting: Family Medicine

## 2020-08-27 ENCOUNTER — Ambulatory Visit (INDEPENDENT_AMBULATORY_CARE_PROVIDER_SITE_OTHER): Payer: Medicare Other | Admitting: Family Medicine

## 2020-08-27 ENCOUNTER — Other Ambulatory Visit: Payer: Self-pay

## 2020-08-27 VITALS — BP 124/82 | HR 73 | Temp 98.6°F | Resp 18 | Ht 70.0 in | Wt 247.1 lb

## 2020-08-27 DIAGNOSIS — Z5181 Encounter for therapeutic drug level monitoring: Secondary | ICD-10-CM

## 2020-08-27 DIAGNOSIS — K219 Gastro-esophageal reflux disease without esophagitis: Secondary | ICD-10-CM | POA: Diagnosis not present

## 2020-08-27 DIAGNOSIS — M8949 Other hypertrophic osteoarthropathy, multiple sites: Secondary | ICD-10-CM

## 2020-08-27 DIAGNOSIS — M159 Polyosteoarthritis, unspecified: Secondary | ICD-10-CM

## 2020-08-27 DIAGNOSIS — E669 Obesity, unspecified: Secondary | ICD-10-CM | POA: Diagnosis not present

## 2020-08-27 DIAGNOSIS — M47816 Spondylosis without myelopathy or radiculopathy, lumbar region: Secondary | ICD-10-CM

## 2020-08-27 DIAGNOSIS — R350 Frequency of micturition: Secondary | ICD-10-CM

## 2020-08-27 DIAGNOSIS — R7303 Prediabetes: Secondary | ICD-10-CM

## 2020-08-27 DIAGNOSIS — E782 Mixed hyperlipidemia: Secondary | ICD-10-CM

## 2020-08-27 DIAGNOSIS — N138 Other obstructive and reflux uropathy: Secondary | ICD-10-CM | POA: Diagnosis not present

## 2020-08-27 DIAGNOSIS — Z8042 Family history of malignant neoplasm of prostate: Secondary | ICD-10-CM

## 2020-08-27 DIAGNOSIS — Z791 Long term (current) use of non-steroidal anti-inflammatories (NSAID): Secondary | ICD-10-CM

## 2020-08-27 DIAGNOSIS — N401 Enlarged prostate with lower urinary tract symptoms: Secondary | ICD-10-CM | POA: Diagnosis not present

## 2020-08-27 LAB — POCT GLYCOSYLATED HEMOGLOBIN (HGB A1C): Hemoglobin A1C: 5.8 % — AB (ref 4.0–5.6)

## 2020-08-27 LAB — PSA: PSA: 0.19 ng/mL (ref <=4.00)

## 2020-08-27 LAB — BASIC METABOLIC PANEL
BUN: 18 mg/dL (ref 7–25)
CO2: 27 mmol/L (ref 20–32)
Calcium: 9.8 mg/dL (ref 8.6–10.3)
Chloride: 109 mmol/L (ref 98–110)
Creat: 0.98 mg/dL (ref 0.70–1.35)
Glucose, Bld: 88 mg/dL (ref 65–99)
Potassium: 4.6 mmol/L (ref 3.5–5.3)
Sodium: 143 mmol/L (ref 135–146)

## 2020-08-27 MED ORDER — FAMOTIDINE 20 MG PO TABS: ORAL_TABLET | ORAL | 3 refills | Status: DC

## 2020-08-27 MED ORDER — SIMVASTATIN 40 MG PO TABS: 40.0000 mg | ORAL_TABLET | Freq: Every day | ORAL | 3 refills | Status: DC

## 2020-08-27 MED ORDER — IBUPROFEN 800 MG PO TABS: 800.0000 mg | ORAL_TABLET | Freq: Three times a day (TID) | ORAL | 0 refills | Status: DC | PRN

## 2020-08-27 NOTE — Assessment & Plan Note (Signed)
Stable. A1c 5.8 today. Recommend weight loss through diet and exercise.

## 2020-08-27 NOTE — Assessment & Plan Note (Signed)
Check lipids today.  Continue statin. 

## 2020-08-27 NOTE — Progress Notes (Signed)
    SUBJECTIVE:   CHIEF COMPLAINT / HPI:   Prediabetes - Last A1c 5.5 08/2019 - Medications: none - Compliance: n/a - Checking BG at home: no - Denies symptoms of hypoglycemia, polyuria, polydipsia, numbness extremities, foot ulcers/trauma  HLD - medications: simvastatin 40mg  - compliance: good - medication SEs: none  Benign Prostatic Hypertrophy - Medications: finasteride 5mg  - Symptoms: nocturia one time a night no complicating symptoms - Denies: incomplete emptying and weak stream no complicating symptoms - no personal history of prostate cancer, grandfather and dad both with prostate cancer  GERD - Meds: pepcid 20mg  BID  - Symptoms:  occasional abdominal bloating.  - Takes mainly b/c taking ibuprofen daily. - Denies choking on food, difficulty swallowing, dysphagia, and early satiety denies dysphagia has not lost weight denies melena, hematochezia, hematemesis, and coffee ground emesis.    OBJECTIVE:   BP 124/82   Pulse 73   Temp 98.6 F (37 C) (Oral)   Resp 18   Ht 5\' 10"  (1.778 m)   Wt 247 lb 1.6 oz (112.1 kg)   SpO2 98%   BMI 35.46 kg/m   Gen: overweight, in NAD Card: RRR Lungs: CTAB Ext: WWP, no edema  ASSESSMENT/PLAN:   GERD (gastroesophageal reflux disease) Doing well on current regimen, no changes made today. Cautioned on daily ibuprofen use, aware of risks.  Osteoarthritis of multiple joints Cautioned on daily ibuprofen use, aware of risks. Also using diclofenac gel with relief to knees, shoulder.  BPH with obstruction/lower urinary tract symptoms Doing well on current regimen, no changes made today.  Encounter for monitoring chronic NSAID therapy Recheck bmp today.  Family history of prostate cancer in father Recheck psa.  Hyperlipidemia Check lipids today. Continue statin.  Obesity (BMI 35.0-39.9 without comorbidity) Contributing to HLD, prediabetes. Recommend weight loss through diet and exercise.   Pre-diabetes Stable. A1c 5.8  today. Recommend weight loss through diet and exercise.    F/u in 6 months.   Myles Gip, DO [place]

## 2020-08-27 NOTE — Assessment & Plan Note (Signed)
Cautioned on daily ibuprofen use, aware of risks. Also using diclofenac gel with relief to knees, shoulder.

## 2020-08-27 NOTE — Assessment & Plan Note (Signed)
Contributing to HLD, prediabetes. Recommend weight loss through diet and exercise.

## 2020-08-27 NOTE — Patient Instructions (Signed)
It was great to see you!  Our plans for today:  - No changes to your medications today. - We are checking some labs today, we will release these results to your MyChart.  Take care and seek immediate care sooner if you develop any concerns.   Dr. Gram Siedlecki  

## 2020-08-27 NOTE — Assessment & Plan Note (Signed)
Recheck psa.

## 2020-08-27 NOTE — Assessment & Plan Note (Signed)
Doing well on current regimen, no changes made today. 

## 2020-08-27 NOTE — Assessment & Plan Note (Signed)
Doing well on current regimen, no changes made today. Cautioned on daily ibuprofen use, aware of risks.

## 2020-08-27 NOTE — Assessment & Plan Note (Signed)
Recheck bmp today.

## 2020-09-01 IMAGING — US US ABDOMEN LIMITED
1 series · 10 of 10 positions shown · non-contrast
Comparison: August 18, 2011.

CLINICAL DATA: Abdominal wall mass of left lower quadrant.

EXAM:
ULTRASOUND ABDOMEN LIMITED

[Series 1: us abdomen limited · 0.07mm/px · 10 of 10 slices shown]
[im 1/10]
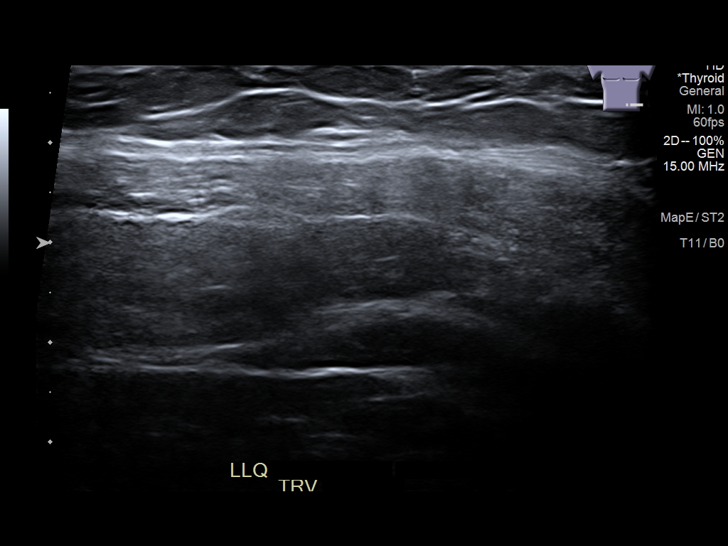
[im 2/10]
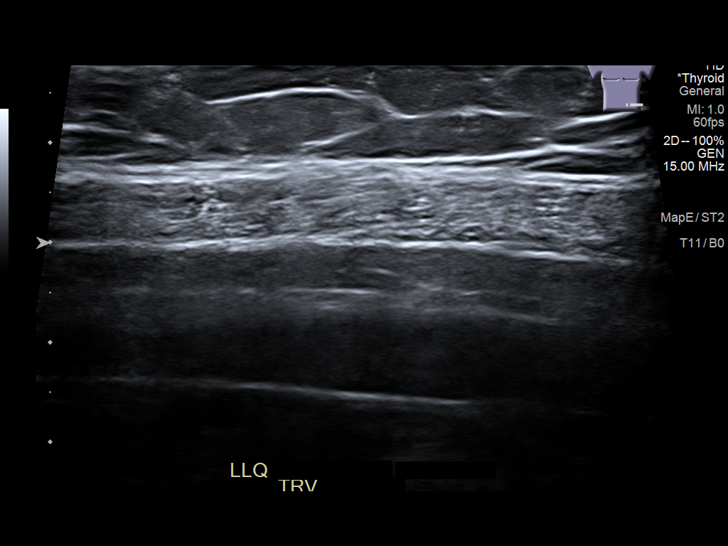
[im 3/10]
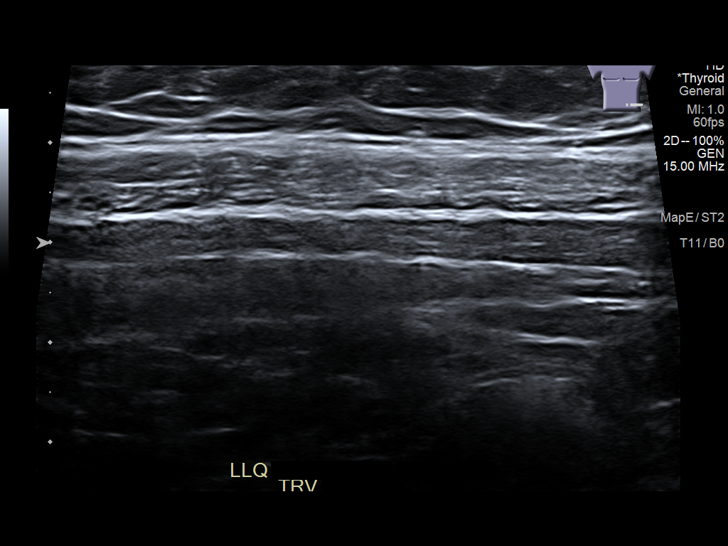
[im 4/10]
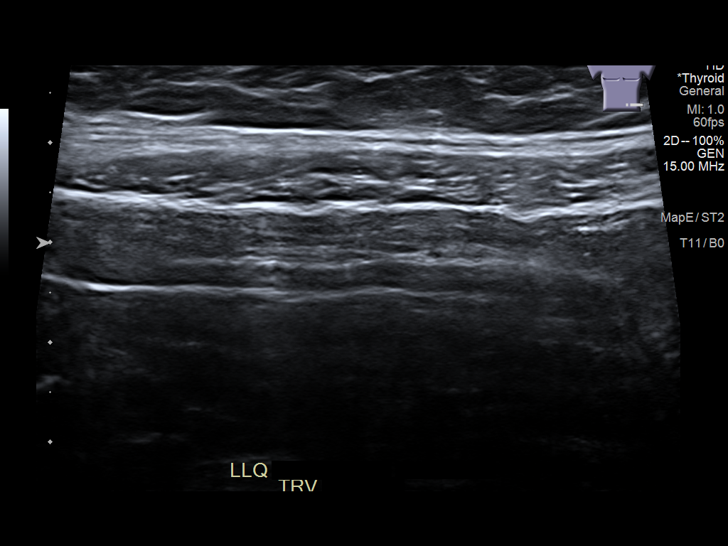
[im 5/10]
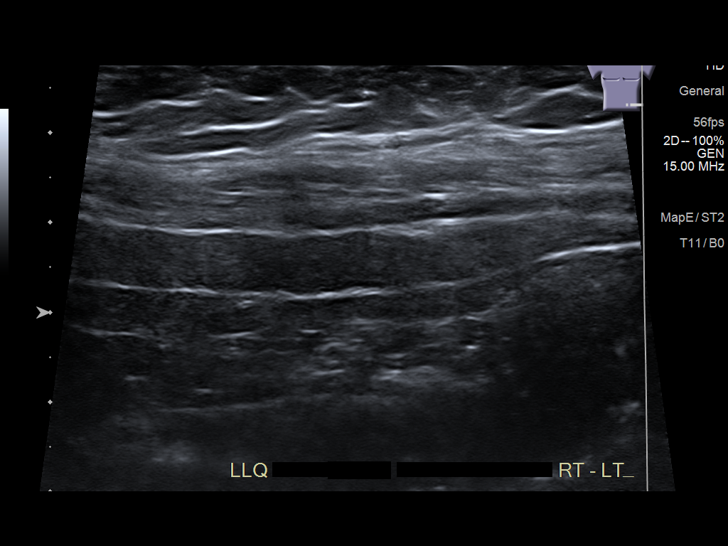
[im 6/10]
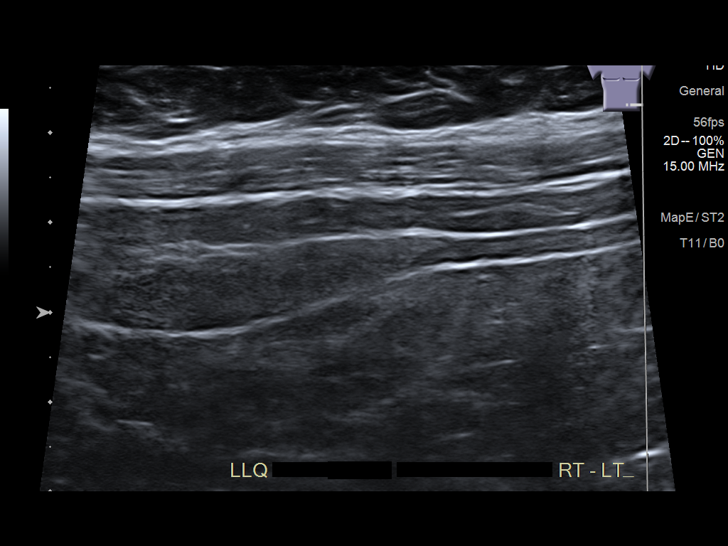
[im 7/10]
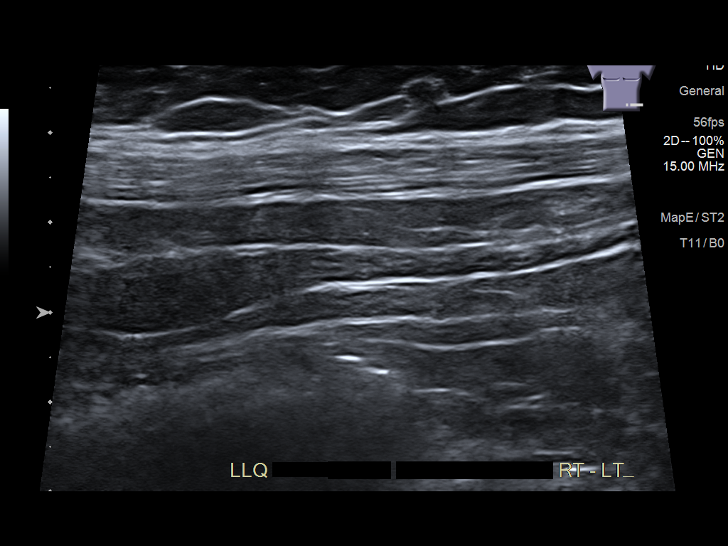
[im 8/10]
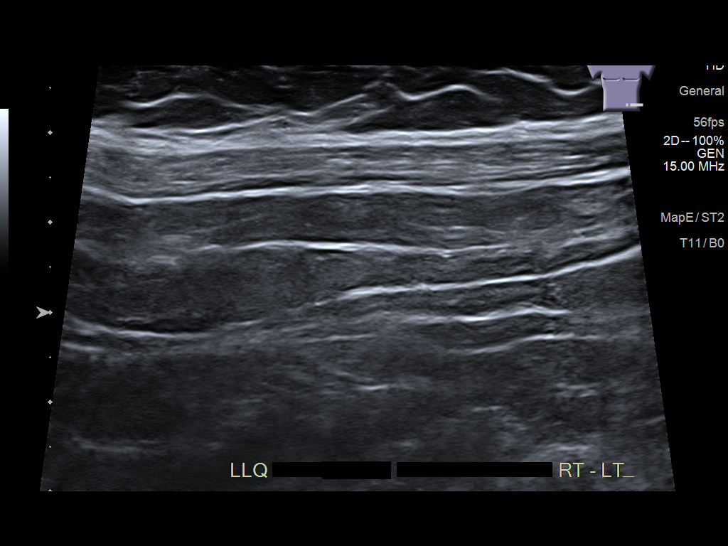
[im 9/10]
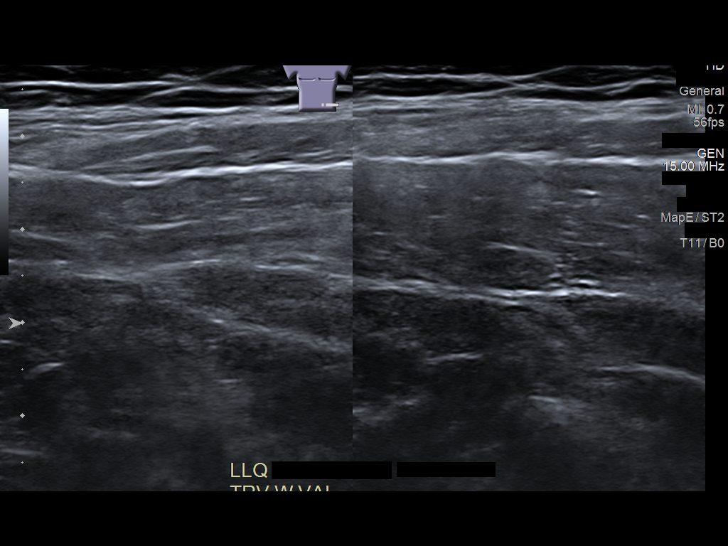
[im 10/10]
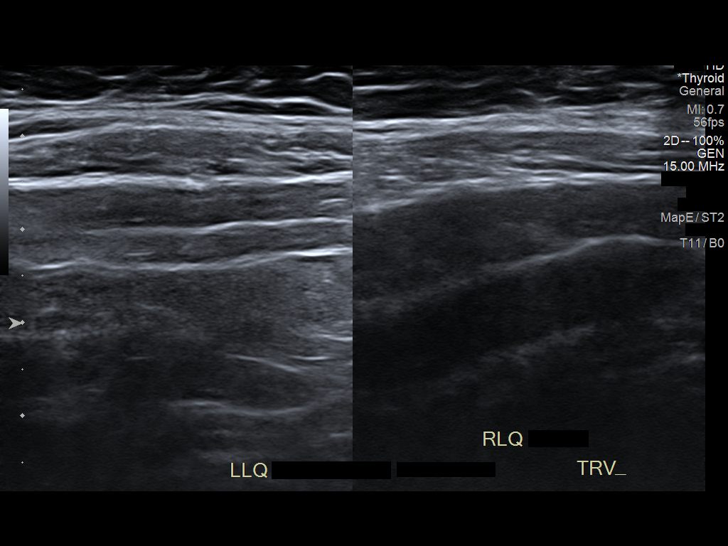

[10 of 10 positions shown; findings below may reference images not displayed]

FINDINGS: Limited sonographic evaluation was performed in the area of palpable
concern in the left lower quadrant. No sonographic evidence of mass,
cyst or fluid collection is noted. No definite hernia is noted.
IMPRESSION: No definite sonographic abnormality seen in the area of palpable
concern in the left lower quadrant of the abdomen.

## 2020-09-05 ENCOUNTER — Other Ambulatory Visit: Payer: Self-pay | Admitting: Family Medicine

## 2020-09-24 DIAGNOSIS — D2262 Melanocytic nevi of left upper limb, including shoulder: Secondary | ICD-10-CM | POA: Diagnosis not present

## 2020-09-24 DIAGNOSIS — X32XXXA Exposure to sunlight, initial encounter: Secondary | ICD-10-CM | POA: Diagnosis not present

## 2020-09-24 DIAGNOSIS — D225 Melanocytic nevi of trunk: Secondary | ICD-10-CM | POA: Diagnosis not present

## 2020-09-24 DIAGNOSIS — D2261 Melanocytic nevi of right upper limb, including shoulder: Secondary | ICD-10-CM | POA: Diagnosis not present

## 2020-09-24 DIAGNOSIS — L820 Inflamed seborrheic keratosis: Secondary | ICD-10-CM | POA: Diagnosis not present

## 2020-09-24 DIAGNOSIS — R208 Other disturbances of skin sensation: Secondary | ICD-10-CM | POA: Diagnosis not present

## 2020-09-24 DIAGNOSIS — L570 Actinic keratosis: Secondary | ICD-10-CM | POA: Diagnosis not present

## 2020-09-24 DIAGNOSIS — Z85828 Personal history of other malignant neoplasm of skin: Secondary | ICD-10-CM | POA: Diagnosis not present

## 2020-09-24 DIAGNOSIS — L538 Other specified erythematous conditions: Secondary | ICD-10-CM | POA: Diagnosis not present

## 2020-10-16 ENCOUNTER — Ambulatory Visit (INDEPENDENT_AMBULATORY_CARE_PROVIDER_SITE_OTHER): Payer: Medicare Other

## 2020-10-16 DIAGNOSIS — Z0000 Encounter for general adult medical examination without abnormal findings: Secondary | ICD-10-CM | POA: Diagnosis not present

## 2020-10-16 DIAGNOSIS — Z1211 Encounter for screening for malignant neoplasm of colon: Secondary | ICD-10-CM

## 2020-10-16 NOTE — Progress Notes (Signed)
Subjective:   Vincent Guerra is a 70 y.o. male who presents for Medicare Annual/Subsequent preventive examination.  Virtual Visit via Telephone Note  I connected with  Vincent Guerra on 10/16/20 at  8:00 AM EDT by telephone and verified that I am speaking with the correct person using two identifiers.  Location: Patient: home Provider: [place] Persons participating in the virtual visit: [place]   I discussed the limitations, risks, security and privacy concerns of performing an evaluation and management service by telephone and the availability of in person appointments. The patient expressed understanding and agreed to proceed.  Interactive audio and video telecommunications were attempted between this nurse and patient, however failed, due to patient having technical difficulties OR patient did not have access to video capability.  We continued and completed visit with audio only.  Some vital signs may be absent or patient reported.   [name], LPN   Review of Systems     Cardiac Risk Factors include: advanced age (>60mn, >>14women);dyslipidemia;male gender;obesity (BMI >30kg/m2)     Objective:    Today's Vitals   10/16/20 0814  PainSc: 2    There is no height or weight on file to calculate BMI.  Advanced Directives 10/16/2020 10/13/2019 01/17/2019 01/04/2019 10/08/2018 08/25/2017  Does Patient Have a Medical Advance Directive? No No No No No Yes  Does patient want to make changes to medical advance directive? No - Patient declined - - - - -  Would patient like information on creating a medical advance directive? - No - Patient declined No - Patient declined - Yes (MAU/Ambulatory/Procedural Areas - Information given) -    Current Medications (verified) Outpatient Encounter Medications as of 10/16/2020  Medication Sig   aspirin 81 MG chewable tablet Chew by mouth daily.   diclofenac sodium (VOLTAREN) 1 % GEL Apply 2 g topically 3 (three) times  daily as needed. Use for hands and knee arthritis, joint pain (Patient taking differently: Apply 2 g topically 3 (three) times daily as needed (shoulder pain/hands & knee pain.).)   famotidine (PEPCID) 20 MG tablet TAKE ONE TABLET BY MOUTH TWICE DAILY AS NEEDED FOR HEARTBURN OR INDIGESTION   finasteride (PROSCAR) 5 MG tablet Take 1 tablet (5 mg total) by mouth daily.   fluticasone (FLONASE) 50 MCG/ACT nasal spray SPRAY TWICE INTO EACH NOSTRIL EVERY MORNING   ibuprofen (IBU) 800 MG tablet Take 1 tablet (800 mg total) by mouth every 8 (eight) hours as needed. Take with food   loratadine (CLARITIN) 10 MG tablet Take 10 mg by mouth 2 (two) times daily.    Magnesium 500 MG TABS Take 500 mg by mouth at bedtime.   Multiple Vitamin (MULTIVITAMIN WITH MINERALS) TABS tablet Take 1 tablet by mouth at bedtime.   Omega-3 Fatty Acids (FISH OIL) 1200 MG CAPS Take 1,200 mg by mouth 2 (two) times daily.    Potassium 99 MG TABS Take 1 tablet by mouth in the morning.   simvastatin (ZOCOR) 40 MG tablet Take 1 tablet (40 mg total) by mouth at bedtime.   Turmeric 500 MG CAPS Take 500 mg by mouth every evening.   No facility-administered encounter medications on file as of 10/16/2020.    Allergies (verified) Metronidazole and Tetanus toxoid adsorbed   History: Past Medical History:  Diagnosis Date   Allergy    Arthritis    Diverticulosis    GERD (gastroesophageal reflux disease)    Hyperlipidemia    Past Surgical History:  Procedure Laterality Date  BACK SURGERY  01/17/2015   Dr Harl [facility]), Multiple diskectomy and herniated disc   CHOLECYSTECTOMY  07/07/2019   TOTAL KNEE ARTHROPLASTY Left 01/17/2019   Procedure: TOTAL KNEE ARTHROPLASTY;  Surgeon: Vickey Huger, MD;  Location: [facility];  Service: Orthopedics;  Laterality: Left;  75 mins needed for length of case. 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, Street)  Tobacco Use   Smoking status: Former    Packs/day: 1.00    Types: Cigarettes    Quit date: 1994    Years since quitting: 28.6   Smokeless tobacco: Never  Vaping Use   Vaping Use: Never used  Substance and Sexual Activity   Alcohol use: Yes    Alcohol/week: 24.0 standard drinks    Types: 24 Cans of beer per week   Drug use: No   Sexual activity: Not on file  Other Topics Concern   Not on file  Social History Narrative   Not on file   Social Determinants of Health   Financial Resource Strain: Low Risk    Difficulty of Paying Living Expenses: Not hard at all  Food Insecurity: No Food Insecurity   Worried About Charity fundraiser in the Last Year: Never true   [place] in the Last Year: Never true  Transportation Needs: No Transportation Needs   Lack of Transportation (Medical): No   Lack of Transportation (Non-Medical): No  Physical Activity: Insufficiently Active   Days of Exercise per Week: 7 days   Minutes of Exercise per Session: 20 min  Stress: No Stress Concern Present   Feeling of Stress : Not at all  Social Connections: Moderately Isolated   Frequency of Communication with Friends and Family: More than three times a week   Frequency of Social Gatherings with Friends and Family: Twice a week   Attends Religious Services: Never   Marine scientist or Organizations: No   Attends Music therapist: Never   Marital Status: Married    Tobacco Counseling Counseling given: Not Answered   Clinical Intake:  Pre-visit  preparation completed: Yes  Pain : 0-10 Pain Score: 2  Pain Type: Chronic pain Pain Location: Hand (arthritis) Pain Orientation: Right, Left Pain Descriptors / Indicators: Aching, Sore Pain Onset: More than a month ago Pain Frequency: Intermittent     Nutritional Risks: None Diabetes: No  How often do you need to have someone help you when you read instructions, pamphlets, or other written materials from your doctor or pharmacy?: 1 - Never   Interpreter Needed?: No  Information entered by :: [name] LPN   Activities of Daily Living In your present state of health, do you have any difficulty performing the following activities: 10/16/2020 08/27/2020  Hearing? Y Y  Comment tinnitus -  Vision? N N  Difficulty concentrating or  making decisions? N N  Walking or climbing stairs? N N  Dressing or bathing? N N  Doing errands, shopping? N N  Preparing Food and eating ? N -  Using the Toilet? N -  In the past six months, have you accidently leaked urine? N -  Do you have problems with loss of bowel control? N -  Managing your Medications? N -  Managing your Finances? N -  Housekeeping or managing your Housekeeping? N -  Some recent data might be hidden    Patient Care Team: [place] as PCP - General (Family Medicine) Dermatology, [place]  Indicate any recent Medical Services you may have received from other than [facility] providers in the past year (date may be approximate).     Assessment:   This is a routine wellness examination for Tish[place].  Hearing/Vision screen Hearing Screening - Comments:: Pt denies hearing difficulty Vision Screening - Comments:: [facility] Dr. Wallace Going  Dietary issues and exercise activities discussed: Current Exercise Habits: Home exercise routine, Type of exercise: calisthenics;Other - see comments (yard work), Time (Minutes): 20, Frequency (Times/Week): 7, Weekly Exercise  (Minutes/Week): 140, Intensity: Moderate, Exercise limited by: orthopedic condition(s)   Goals Addressed   None    Depression Screen PHQ 2/9 Scores 10/16/2020 08/27/2020 03/01/2020 10/13/2019 09/01/2019 04/27/2019 03/04/2019  PHQ - 2 Score 0 0 0 0 0 0 0  PHQ- 9 Score - - - - 0 0 3    Fall Risk Fall Risk  10/16/2020 08/27/2020 03/01/2020 10/13/2019 09/01/2019  Falls in the past year? - 0 0 0 0  Number falls in past yr: 0 0 0 0 0  Injury with Fall? 0 0 0 0 0  Risk for fall due to : No Fall Risks - - No Fall Risks -  Follow up Falls prevention discussed Falls evaluation completed - Falls prevention discussed -    FALL RISK PREVENTION PERTAINING TO THE HOME:  Any stairs in or around the home? Yes  If so, are there any without handrails? No  Home free of loose throw rugs in walkways, pet beds, electrical cords, etc? Yes  Adequate lighting in your home to reduce risk of falls? Yes   ASSISTIVE DEVICES UTILIZED TO PREVENT FALLS:  Life alert? No  Use of a cane, walker or w/c? No  Grab bars in the bathroom? Yes  Shower chair or bench in shower? Yes  Elevated toilet seat or a handicapped toilet? Yes   TIMED UP AND GO:  Was the test performed? No . Telephonic visit.   Cognitive Function: Normal cognitive status assessed by direct observation by this Nurse Health Advisor. No abnormalities found.     MMSE - Mini Mental State Exam 08/25/2017  Orientation to time 5  Orientation to Place 5  Registration 3  Attention/ Calculation 5  Recall 3  Language- name 2 objects 2  Language- repeat 1  Language- follow 3 step command 3  Language- read & follow direction 1  Write a sentence 1  Copy design 1  Total score 30     6CIT Screen 10/13/2019 10/08/2018  What Year? 0 points 0 points  What month? 0 points 0 points  What time? 0 points 0 points  Count back from 20 0 points 0 points  Months in reverse 0 points 0 points  Repeat phrase 0 points 0 points  Total Score 0 0     Immunizations Immunization History  Administered Date(s) Administered  Fluad Quad(high Dose 65+) 11/09/2018, 03/01/2020   Influenza-Unspecified 12/02/2017   PFIZER(Purple Top)SARS-COV-2 Vaccination 03/24/2019, 04/14/2019, 12/07/2019   Pneumococcal Conjugate-13 03/02/2017   Pneumococcal Polysaccharide-23 09/01/2018    TDAP status: n/a due to allergy  Flu Vaccine status: Due, Education has been provided regarding the importance of this vaccine. Advised may receive this vaccine at local pharmacy or Health Dept. Aware to provide a copy of the vaccination record if obtained from local pharmacy or Health Dept. Verbalized acceptance and understanding.  Pneumococcal vaccine status: Up to date  Covid-19 vaccine status: Completed vaccines  Qualifies for Shingles Vaccine? Yes   Zostavax completed No   Shingrix Completed?: No.    Education has been provided regarding the importance of this vaccine. Patient has been advised to call insurance company to determine out of pocket expense if they have not yet received this vaccine. Advised may also receive vaccine at local pharmacy or Health Dept. Verbalized acceptance and understanding.  Screening Tests Health Maintenance  Topic Date Due   Zoster Vaccines- Shingrix (1 of 2) Never done   COVID-19 Vaccine (4 - Booster for Pfizer series) 04/08/2020   INFLUENZA VACCINE  09/10/2020   TETANUS/TDAP  08/23/2026 (Originally 09/06/1969)   COLONOSCOPY (Pts 45-89yr Insurance coverage will need to be confirmed)  02/10/2021   Hepatitis C Screening  Completed   PNA vac Low Risk Adult  Completed   HPV VACCINES  Aged Out    Health Maintenance  Health Maintenance Due  Topic Date Due   Zoster Vaccines- Shingrix (1 of 2) Never done   COVID-19 Vaccine (4 - Booster for Pfizer series) 04/08/2020   INFLUENZA VACCINE  09/10/2020    Colorectal cancer screening: Type of screening: Colonoscopy. Completed 02/2011. Repeat every 10 years. [facility] Pt aware they will contact him to schedule  Lung Cancer Screening: (Low Dose CT Chest recommended if Age 69-80years, 30 pack-year currently smoking OR have quit w/in 15years.) does not qualify.   Additional Screening:  Hepatitis C Screening: does qualify; Completed 07/29/17  Vision Screening: Recommended annual ophthalmology exams for early detection of glaucoma and other disorders of the eye. Is the patient up to date with their annual eye exam?  Yes  Who is the provider or what [facility] the [facility]   Dental Screening: Recommended annual dental exams for proper oral hygiene  Community Resource Referral / Chronic Care Management: CRR required this visit?  No   CCM required this visit?  No      Plan:     I have personally reviewed and noted the following in the patient's chart:   Medical and social history Use of alcohol, tobacco or illicit drugs  Current medications and supplements including opioid prescriptions. Patient is not currently taking opioid prescriptions. Functional ability and status Nutritional status Physical activity Advanced directives List of other physicians Hospitalizations, surgeries, and ER visits in previous 12 months Vitals Screenings to include cognitive, depression, and falls Referrals and appointments  In addition, I have reviewed and discussed with patient certain preventive protocols, quality metrics, and best practice recommendations. A written personalized care plan for preventive services as well as general preventive health recommendations were provided to patient.  Due to this being a telephonic visit, the after visit summary with patients personalized plan was offered to patient via mail or my-chart. Patient declined at this time.      KClemetine Marker LPN  10/16/2020   Nurse Notes: none

## 2020-10-16 NOTE — Patient Instructions (Signed)
Vincent Guerra , Thank you for taking time to come for your Medicare Wellness Visit. I appreciate your ongoing commitment to your health goals. Please review the following plan we discussed and let me know if I can assist you in the future.   Screening recommendations/referrals: Colonoscopy: done 02/2011. Referral sent to [facility] today for repeat screening colonoscopy. They will contact you for an appointment.  Recommended yearly ophthalmology/optometry visit for glaucoma screening and checkup Recommended yearly dental visit for hygiene and checkup  Vaccinations: Influenza vaccine: done 03/01/20 Pneumococcal vaccine: done 09/01/18 Tdap vaccine: n/a due to allergy  Shingles vaccine: Shingrix discussed. Please contact your pharmacy for coverage information.  Covid-19: done 03/24/19, 04/14/19 & 12/07/19  Advanced directives: Advance directive discussed with you today. Even though you declined this today please call our office should you change your mind and we can give you the proper paperwork for you to fill out.   Conditions/risks identified: Keep up the great work!  Next appointment: Follow up in one year for your annual wellness visit.   Preventive Care 65 Years and Older, Male Preventive care refers to lifestyle choices and visits with your health care provider that can promote health and wellness. What does preventive care include? A yearly physical exam. This is also called an annual well check. Dental exams once or twice a year. Routine eye exams. Ask your health care provider how often you should have your eyes checked. Personal lifestyle choices, including: Daily care of your teeth and gums. Regular physical activity. Eating a healthy diet. Avoiding tobacco and drug use. Limiting alcohol use. Practicing safe sex. Taking low doses of aspirin every day. Taking vitamin and mineral supplements as recommended by your health care provider. What happens during an annual  well check? The services and screenings done by your health care provider during your annual well check will depend on your age, overall health, lifestyle risk factors, and family history of disease. Counseling  Your health care provider may ask you questions about your: Alcohol use. Tobacco use. Drug use. Emotional well-being. Home and relationship well-being. Sexual activity. Eating habits. History of falls. Memory and ability to understand (cognition). Work and work Statistician. Screening  You may have the following tests or measurements: Height, weight, and BMI. Blood pressure. Lipid and cholesterol levels. These may be checked every 5 years, or more frequently if you are over 64 years old. Skin check. Lung cancer screening. You may have this screening every year starting at age 59 if you have a 30-pack-year history of smoking and currently smoke or have quit within the past 15 years. Fecal occult blood test (FOBT) of the stool. You may have this test every year starting at age 20. Flexible sigmoidoscopy or colonoscopy. You may have a sigmoidoscopy every 5 years or a colonoscopy every 10 years starting at age 65. Prostate cancer screening. Recommendations will vary depending on your family history and other risks. Hepatitis C blood test. Hepatitis B blood test. Sexually transmitted disease (STD) testing. Diabetes screening. This is done by checking your blood sugar (glucose) after you have not eaten for a while (fasting). You may have this done every 1-3 years. Abdominal aortic aneurysm (AAA) screening. You may need this if you are a current or former smoker. Osteoporosis. You may be screened starting at age 29 if you are at high risk. Talk with your health care provider about your test results, treatment options, and if necessary, the need for more tests. Vaccines  Your health care provider  may recommend certain vaccines, such as: Influenza vaccine. This is recommended every  year. Tetanus, diphtheria, and acellular pertussis (Tdap, Td) vaccine. You may need a Td booster every 10 years. Zoster vaccine. You may need this after age 22. Pneumococcal 13-valent conjugate (PCV13) vaccine. One dose is recommended after age 10. Pneumococcal polysaccharide (PPSV23) vaccine. One dose is recommended after age 37. Talk to your health care provider about which screenings and vaccines you need and how often you need them. This information is not intended to replace advice given to you by your health care provider. Make sure you discuss any questions you have with your health care provider. Document Released: 02/23/2015 Document Revised: 10/17/2015 Document Reviewed: 11/28/2014 Elsevier Interactive Patient Education  2017 [employer] Prevention in the Home Falls can cause injuries. They can happen to people of all ages. There are many things you can do to make your home safe and to help prevent falls. What can I do on the outside of my home? Regularly fix the edges of walkways and driveways and fix any cracks. Remove anything that might make you trip as you walk through a door, such as a raised step or threshold. Trim any bushes or trees on the path to your home. Use bright outdoor lighting. Clear any walking paths of anything that might make someone trip, such as rocks or tools. Regularly check to see if handrails are loose or broken. Make sure that both sides of any steps have handrails. Any raised decks and porches should have guardrails on the edges. Have any leaves, snow, or ice cleared regularly. Use sand or salt on walking paths during winter. Clean up any spills in your garage right away. This includes oil or grease spills. What can I do in the bathroom? Use night lights. Install grab bars by the toilet and in the tub and shower. Do not use towel bars as grab bars. Use non-skid mats or decals in the tub or shower. If you need to sit down in the shower, use a  plastic, non-slip stool. Keep the floor dry. Clean up any water that spills on the floor as soon as it happens. Remove soap buildup in the tub or shower regularly. Attach bath mats securely with double-sided non-slip rug tape. Do not have throw rugs and other things on the floor that can make you trip. What can I do in the bedroom? Use night lights. Make sure that you have a light by your bed that is easy to reach. Do not use any sheets or blankets that are too big for your bed. They should not hang down onto the floor. Have a firm chair that has side arms. You can use this for support while you get dressed. Do not have throw rugs and other things on the floor that can make you trip. What can I do in the kitchen? Clean up any spills right away. Avoid walking on wet floors. Keep items that you use a lot in easy-to-reach places. If you need to reach something above you, use a strong step stool that has a grab bar. Keep electrical cords out of the way. Do not use floor polish or wax that makes floors slippery. If you must use wax, use non-skid floor wax. Do not have throw rugs and other things on the floor that can make you trip. What can I do with my stairs? Do not leave any items on the stairs. Make sure that there are handrails on both sides  of the stairs and use them. Fix handrails that are broken or loose. Make sure that handrails are as long as the stairways. Check any carpeting to make sure that it is firmly attached to the stairs. Fix any carpet that is loose or worn. Avoid having throw rugs at the top or bottom of the stairs. If you do have throw rugs, attach them to the floor with carpet tape. Make sure that you have a light switch at the top of the stairs and the bottom of the stairs. If you do not have them, ask someone to add them for you. What else can I do to help prevent falls? Wear shoes that: Do not have high heels. Have rubber bottoms. Are comfortable and fit you  well. Are closed at the toe. Do not wear sandals. If you use a stepladder: Make sure that it is fully opened. Do not climb a closed stepladder. Make sure that both sides of the stepladder are locked into place. Ask someone to hold it for you, if possible. Clearly mark and make sure that you can see: Any grab bars or handrails. First and last steps. Where the edge of each step is. Use tools that help you move around (mobility aids) if they are needed. These include: Canes. Walkers. Scooters. Crutches. Turn on the lights when you go into a dark area. Replace any light bulbs as soon as they burn out. Set up your furniture so you have a clear path. Avoid moving your furniture around. If any of your floors are uneven, fix them. If there are any pets around you, be aware of where they are. Review your medicines with your doctor. Some medicines can make you feel dizzy. This can increase your chance of falling. Ask your doctor what other things that you can do to help prevent falls. This information is not intended to replace advice given to you by your health care provider. Make sure you discuss any questions you have with your health care provider. Document Released: 11/23/2008 Document Revised: 07/05/2015 Document Reviewed: 03/03/2014 Elsevier Interactive Patient Education  2017 [employer].

## 2020-12-19 DIAGNOSIS — Z23 Encounter for immunization: Secondary | ICD-10-CM | POA: Diagnosis not present

## 2021-01-01 ENCOUNTER — Telehealth: Payer: Self-pay | Admitting: Family Medicine

## 2021-01-01 ENCOUNTER — Other Ambulatory Visit: Payer: Self-pay | Admitting: Family Medicine

## 2021-01-01 DIAGNOSIS — M47816 Spondylosis without myelopathy or radiculopathy, lumbar region: Secondary | ICD-10-CM

## 2021-01-01 DIAGNOSIS — M159 Polyosteoarthritis, unspecified: Secondary | ICD-10-CM

## 2021-01-01 DIAGNOSIS — N138 Other obstructive and reflux uropathy: Secondary | ICD-10-CM

## 2021-01-01 NOTE — Telephone Encounter (Signed)
Patient has a year supply, given on 04/10/19 pt will need to contact pharmacy if out

## 2021-01-01 NOTE — Telephone Encounter (Signed)
Future fill request

## 2021-01-01 NOTE — Telephone Encounter (Signed)
Rx finasteride- 04/09/20 #90 3RF- request future fill Requested Prescriptions  Pending Prescriptions Disp Refills  . finasteride (PROSCAR) 5 MG tablet [Pharmacy Med Name: FINASTERIDE 5 MG TAB] 90 tablet 3    Sig: TAKE 1 TABLET BY MOUTH DAILY     Urology: 5-alpha Reductase Inhibitors Passed - 01/01/2021  9:59 AM      Passed - Valid encounter within last 12 months    Recent Outpatient Visits          4 months ago Pre-diabetes   [facility] Rory Percy M, DO   10 months ago Mixed hyperlipidemia   [place], MD   1 year ago Mixed hyperlipidemia   [facility] Lebron Conners D, MD   1 year ago Left lower quadrant abdominal pain   [place], MD   1 year ago Abdominal wall mass of left lower quadrant   [place], [place]      Future Appointments            In 1 month Delsa Grana, PA-C [facility], [place]   In 9 months  [facility], [place]           . IBU 800 MG tablet [Pharmacy Med Name: IBUPROFEN 800 MG TAB] 270 tablet 0    Sig: TAKE 1 TABLET BY MOUTH EVERY 8 HOURS AS NEEDED.**TAKE WITH FOOD**     Analgesics:  NSAIDS Failed - 01/01/2021  9:59 AM      Failed - HGB in normal range and within 360 days    Hemoglobin  Date Value Ref Range Status  09/01/2019 14.9 13.2 - 17.1 g/dL Final   HGB  Date Value Ref Range Status  08/18/2011 14.8 13.0 - 18.0 g/dL Final         Passed - Cr in normal range and within 360 days    Creat  Date Value Ref Range Status  08/27/2020 0.98 0.70 - 1.35 mg/dL Final         Passed - Patient is not pregnant      Passed - Valid encounter within last 12 months    Recent Outpatient Visits          4 months ago Pre-diabetes   [place], DO   10 months ago Mixed hyperlipidemia   [place], MD   1 year ago Mixed hyperlipidemia   [place], MD   1 year ago Left lower quadrant abdominal pain   [place], MD   1 year ago Abdominal wall mass of left lower quadrant   [place], [place]      Future Appointments            In 1 month Delsa Grana, PA-C [facility], [place]   In 9 months  [facility], [facility]

## 2021-02-07 DIAGNOSIS — M19011 Primary osteoarthritis, right shoulder: Secondary | ICD-10-CM | POA: Insufficient documentation

## 2021-02-07 DIAGNOSIS — M19012 Primary osteoarthritis, left shoulder: Secondary | ICD-10-CM | POA: Diagnosis not present

## 2021-02-07 DIAGNOSIS — G8929 Other chronic pain: Secondary | ICD-10-CM | POA: Insufficient documentation

## 2021-02-26 ENCOUNTER — Ambulatory Visit: Payer: Medicare Other | Admitting: Internal Medicine

## 2021-03-06 ENCOUNTER — Other Ambulatory Visit: Payer: Self-pay | Admitting: Family Medicine

## 2021-03-11 ENCOUNTER — Encounter: Payer: Self-pay | Admitting: Internal Medicine

## 2021-03-11 ENCOUNTER — Ambulatory Visit (INDEPENDENT_AMBULATORY_CARE_PROVIDER_SITE_OTHER): Payer: Medicare Other | Admitting: Internal Medicine

## 2021-03-11 VITALS — BP 118/72 | HR 82 | Temp 98.4°F | Resp 16 | Ht 70.0 in | Wt 245.3 lb

## 2021-03-11 DIAGNOSIS — E782 Mixed hyperlipidemia: Secondary | ICD-10-CM | POA: Diagnosis not present

## 2021-03-11 DIAGNOSIS — R7303 Prediabetes: Secondary | ICD-10-CM | POA: Diagnosis not present

## 2021-03-11 DIAGNOSIS — J3089 Other allergic rhinitis: Secondary | ICD-10-CM | POA: Diagnosis not present

## 2021-03-11 DIAGNOSIS — N401 Enlarged prostate with lower urinary tract symptoms: Secondary | ICD-10-CM | POA: Diagnosis not present

## 2021-03-11 DIAGNOSIS — M1990 Unspecified osteoarthritis, unspecified site: Secondary | ICD-10-CM

## 2021-03-11 DIAGNOSIS — N138 Other obstructive and reflux uropathy: Secondary | ICD-10-CM

## 2021-03-11 DIAGNOSIS — Z1211 Encounter for screening for malignant neoplasm of colon: Secondary | ICD-10-CM | POA: Diagnosis not present

## 2021-03-11 DIAGNOSIS — K219 Gastro-esophageal reflux disease without esophagitis: Secondary | ICD-10-CM | POA: Diagnosis not present

## 2021-03-11 MED ORDER — TAMSULOSIN HCL 0.4 MG PO CAPS: 0.4000 mg | ORAL_CAPSULE | Freq: Every day | ORAL | 3 refills | Status: DC

## 2021-03-11 NOTE — Patient Instructions (Addendum)
It was great seeing you today!  Plan discussed at today's visit: -Blood work ordered today, results will be uploaded to [facility].  -Flomax sent to pharmacy to try for urinary symptoms  -Cologuard ordered for colon cancer screening  -Try Nasogel (nasal saline) to help keep nose from getting dry - continue Flonase   Follow up in: 6 months   Take care and let us know if you have any questions or concerns prior to your next visit.  Dr. Rosana Berger

## 2021-03-11 NOTE — Progress Notes (Signed)
Established Patient Office Visit  Subjective:  Patient ID: Vincent Guerra, male    DOB: 10-04-1950  Age: 71 y.o. MRN: [id number]  CC:  Chief Complaint  Patient presents with   Follow-up   Hyperlipidemia   Gastroesophageal Reflux    HPI Saber Dickerman presents for follow up on chronic medical conditions.   Pre-Diabetes: -Last A1c 7/22 5.8%, not currently on any medications   HLD: -Medications: Simvastatin 40 -Patient is compliant with above medications and reports no side effects.  -Last lipid panel: 7/21: TC 170, HDL 51, triglycerides 83, LDL 102  BPH: -Currently on Finasteride 5 mg, helps with some urinary issues but still having hard time initiationing urine stream with inconsistent flow, nocturia 1-2 twice a night  -Last PSA 7/22 0.19  GERD: -Currently on Pepcid 20 mg BID, doing well   Arthritis: -Taking Ibuprofen daily, uses Voltaren as well  Allergic Rhinitis: -Currently on Claritin and Flonase but only once a day (causes nose bleeds if used more frequently), occasionally using Afrin at night -History of broken nose several times, deviated septum   Health Maintenance: -Blood work -due for lipid panel, A1c -Colon cancer screening: cologuard ordered today  Past Medical History:  Diagnosis Date   Allergy    Arthritis    Diverticulosis    GERD (gastroesophageal reflux disease)    Hyperlipidemia     Past Surgical History:  Procedure Laterality Date   BACK SURGERY  01/17/2015   Dr Harl Bowie [facility]), Multiple diskectomy and herniated disc   CHOLECYSTECTOMY  07/07/2019   TOTAL KNEE ARTHROPLASTY Left 01/17/2019   Procedure: TOTAL KNEE ARTHROPLASTY;  Surgeon: Vickey Huger, MD;  Location: [facility];  Service: Orthopedics;  Laterality: Left;  75 mins needed for length of case. 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, Street)  Tobacco Use   Smoking status: Former    Packs/day: 1.00    Types: Cigarettes    Quit date: 1994    Years since quitting: 29.0   Smokeless tobacco: Never  Vaping Use   Vaping Use: Never used  Substance and Sexual Activity   Alcohol use: Yes    Alcohol/week: 24.0 standard drinks    Types: 24 Cans of beer per week   Drug use: No   Sexual activity: Not on file  Other Topics Concern   Not on file  Social History Narrative   Not on file   Social Determinants of Health   Financial Resource Strain: Low Risk    Difficulty of Paying Living Expenses: Not hard at all  Food Insecurity: No Food Insecurity   Worried About Charity fundraiser in the Last Year: Never true   [place] in the Last Year: Never true  Transportation Needs: No Transportation Needs   Lack of Transportation (Medical): No  Lack of Transportation (Non-Medical): No  Physical Activity: Insufficiently Active   Days of Exercise per Week: 7 days   Minutes of Exercise per Session: 20 min  Stress: No Stress Concern Present   Feeling of Stress : Not at all  Social Connections: Moderately Isolated   Frequency of Communication with Friends and Family: More than three times a week   Frequency of Social Gatherings with Friends and Family: Twice a week   Attends Religious Services: Never   Marine scientist or Organizations: No   Attends Music therapist: Never   Marital Status: Married  Human resources officer Violence: Not At Risk   Fear of Current or Ex-Partner: No   Emotionally Abused: No   Physically Abused: No    Sexually Abused: No    Outpatient Medications Prior to Visit  Medication Sig Dispense Refill   aspirin 81 MG chewable tablet Chew by mouth daily.     diclofenac sodium (VOLTAREN) 1 % GEL Apply 2 g topically 3 (three) times daily as needed. Use for hands and knee arthritis, joint pain (Patient taking differently: Apply 2 g topically 3 (three) times daily as needed (shoulder pain/hands & knee pain.).) 100 g 2   famotidine (PEPCID) 20 MG tablet TAKE ONE TABLET BY MOUTH TWICE DAILY AS NEEDED FOR HEARTBURN OR INDIGESTION 180 tablet 3   finasteride (PROSCAR) 5 MG tablet Take 1 tablet (5 mg total) by mouth daily. 90 tablet 3   fluticasone (FLONASE) 50 MCG/ACT nasal spray SPRAY TWICE INTO EACH NOSTRIL EVERY MORNING 9.9 mL 2   IBU 800 MG tablet TAKE 1 TABLET BY MOUTH EVERY 8 HOURS AS NEEDED.**TAKE WITH FOOD** 270 tablet 0   loratadine (CLARITIN) 10 MG tablet Take 10 mg by mouth 2 (two) times daily.      Magnesium 500 MG TABS Take 500 mg by mouth at bedtime.     Multiple Vitamin (MULTIVITAMIN WITH MINERALS) TABS tablet Take 1 tablet by mouth at bedtime.     Omega-3 Fatty Acids (FISH OIL) 1200 MG CAPS Take 1,200 mg by mouth 2 (two) times daily.      Potassium 99 MG TABS Take 1 tablet by mouth in the morning.     simvastatin (ZOCOR) 40 MG tablet Take 1 tablet (40 mg total) by mouth at bedtime. 90 tablet 3   Turmeric 500 MG CAPS Take 500 mg by mouth every evening.     No facility-administered medications prior to visit.    Allergies  Allergen Reactions   Metronidazole Hives   Tetanus Toxoid Adsorbed Hives    ROS Review of Systems  Constitutional:  Negative for chills and fever.  Eyes:  Negative for visual disturbance.  Respiratory:  Negative for cough.   Cardiovascular:  Negative for chest pain.  Gastrointestinal:  Negative for abdominal distention and blood in stool.  Genitourinary:  Positive for difficulty urinating.  Musculoskeletal:  Positive for arthralgias.     Objective:     Physical Exam Constitutional:      Appearance: Normal appearance.  HENT:     Head: Normocephalic and atraumatic.     Right Ear: Tympanic membrane, ear canal and external ear normal.     Left Ear: Tympanic membrane, ear canal and external ear normal.  Eyes:     Conjunctiva/sclera: Conjunctivae normal.  Cardiovascular:     Rate and Rhythm: Normal rate and regular rhythm.  Pulmonary:     Effort: Pulmonary effort is normal.     Breath sounds: Normal breath sounds.  Musculoskeletal:     Right lower leg: No edema.     Left lower leg: No edema.  Skin:    General: Skin is warm and dry.  Neurological:     General: No focal deficit present.     Mental Status: He is alert. Mental status is at baseline.  Psychiatric:        Mood and Affect: Mood normal.        Behavior: Behavior normal.    BP 118/72    Pulse 82    Temp 98.4 F (36.9 C)    Resp 16    Ht 5\' 10"  (1.778 m)    Wt 245 lb 4.8 oz (111.3 kg)    SpO2 99%    BMI 35.20 kg/m  Wt Readings from Last 3 Encounters:  03/11/21 245 lb 4.8 oz (111.3 kg)  08/27/20 247 lb 1.6 oz (112.1 kg)  03/01/20 246 lb 12.8 oz (111.9 kg)     Health Maintenance Due  Topic Date Due   Zoster Vaccines- Shingrix (1 of 2) Never done   COVID-19 Vaccine (4 - Booster for Pfizer series) 02/01/2020   INFLUENZA VACCINE  09/10/2020   COLONOSCOPY (Pts 45-12yrs Insurance coverage will need to be confirmed)  02/10/2021    There are no preventive care reminders to display for this patient.  Lab Results  Component Value Date   TSH 2.21 11/11/2016   Lab Results  Component Value Date   WBC 9.0 09/01/2019   HGB 14.9 09/01/2019   HCT 43.4 09/01/2019   MCV 91.4 09/01/2019   PLT 200 09/01/2019   Lab Results  Component Value Date   NA 143 08/27/2020   K 4.6 08/27/2020   CO2 27 08/27/2020   GLUCOSE 88 08/27/2020   BUN 18 08/27/2020   CREATININE 0.98 08/27/2020   BILITOT 0.4 09/01/2019   ALKPHOS 49 07/04/2019   AST 19 09/01/2019   ALT 25 09/01/2019    PROT 7.0 09/01/2019   ALBUMIN 4.3 07/04/2019   CALCIUM 9.8 08/27/2020   ANIONGAP 8 07/04/2019   Lab Results  Component Value Date   CHOL 170 09/01/2019   Lab Results  Component Value Date   HDL 51 09/01/2019   Lab Results  Component Value Date   LDLCALC 102 (H) 09/01/2019   Lab Results  Component Value Date   TRIG 83 09/01/2019   Lab Results  Component Value Date   CHOLHDL 3.3 09/01/2019   Lab Results  Component Value Date   HGBA1C 5.8 (A) 08/27/2020      Assessment & Plan:   1. Pre-diabetes: Stable, recheck A1c today.  - HgB A1c  2. Mixed hyperlipidemia: Stable, lipid panel due today, continue statin.  - Lipid Profile  3. BPH with obstruction/lower urinary tract symptoms: Still having symptoms despite Finasteride, try Flomax.   - tamsulosin (FLOMAX) 0.4 MG CAPS capsule; Take 1 capsule (0.4 mg total) by mouth daily.  Dispense: 30 capsule; Refill: 3  4. Gastroesophageal reflux disease without esophagitis: Stable, continue Pepcid.   5. Arthritis: Discussed risks of taking Ibuprofen daily, but states he needs to for his arthritis pain, especially in his hands. He has tried Voltaren as well. Will not take any other NSAIDs and is taking with food.   6. Screening for colon cancer: Cologuard ordered today.  - Cologuard  7. Non-seasonal allergic rhinitis, unspecified trigger: Allergies bothering him today, using Claritin and Flonase, recommend Nasogel to help keep nose dry and to prevent nose bleeds. Discussed risks of using  Afrin frequently and how it can cause rebound congestion, states he uses it very rarely.    Follow-up: Return in about 6 months (around 09/08/2021).    Teodora Medici, DO

## 2021-03-12 LAB — HEMOGLOBIN A1C
Hgb A1c MFr Bld: 6.2 % of total Hgb — ABNORMAL HIGH (ref <5.7)
Mean Plasma Glucose: 131 mg/dL
eAG (mmol/L): 7.3 mmol/L

## 2021-03-12 LAB — LIPID PANEL
Cholesterol: 174 mg/dL (ref <200)
HDL: 52 mg/dL (ref >=40)
LDL Cholesterol (Calc): 103 mg/dL (calc) — ABNORMAL HIGH
Non-HDL Cholesterol (Calc): 122 mg/dL (calc) (ref <130)
Total CHOL/HDL Ratio: 3.3 (calc) (ref <5.0)
Triglycerides: 92 mg/dL (ref <150)

## 2021-03-19 DIAGNOSIS — Z1211 Encounter for screening for malignant neoplasm of colon: Secondary | ICD-10-CM | POA: Diagnosis not present

## 2021-03-26 LAB — COLOGUARD: COLOGUARD: NEGATIVE

## 2021-04-09 ENCOUNTER — Other Ambulatory Visit: Payer: Self-pay | Admitting: Family Medicine

## 2021-04-09 DIAGNOSIS — X32XXXA Exposure to sunlight, initial encounter: Secondary | ICD-10-CM | POA: Diagnosis not present

## 2021-04-09 DIAGNOSIS — L570 Actinic keratosis: Secondary | ICD-10-CM | POA: Diagnosis not present

## 2021-04-09 DIAGNOSIS — N138 Other obstructive and reflux uropathy: Secondary | ICD-10-CM

## 2021-04-09 DIAGNOSIS — D225 Melanocytic nevi of trunk: Secondary | ICD-10-CM | POA: Diagnosis not present

## 2021-04-09 DIAGNOSIS — D2261 Melanocytic nevi of right upper limb, including shoulder: Secondary | ICD-10-CM | POA: Diagnosis not present

## 2021-04-09 DIAGNOSIS — D2272 Melanocytic nevi of left lower limb, including hip: Secondary | ICD-10-CM | POA: Diagnosis not present

## 2021-04-09 DIAGNOSIS — D2262 Melanocytic nevi of left upper limb, including shoulder: Secondary | ICD-10-CM | POA: Diagnosis not present

## 2021-04-09 DIAGNOSIS — Z85828 Personal history of other malignant neoplasm of skin: Secondary | ICD-10-CM | POA: Diagnosis not present

## 2021-04-10 NOTE — Telephone Encounter (Signed)
Requested medications are due for refill today.  unsure ? ?Requested medications are on the active medications list.  yes ? ?Last refill. 04/09/2020 #90 /3 refills ? ?Future visit scheduled.   yes ? ?Notes to clinic.  During OV of 03/11/2021 Flomax was initiated, unclear if pt should be taking both medications. ? ? ? ?Requested Prescriptions  ?Pending Prescriptions Disp Refills  ? finasteride (PROSCAR) 5 MG tablet [Pharmacy Med Name: FINASTERIDE 5 MG TAB] 90 tablet 3  ?  Sig: TAKE 1 TABLET BY MOUTH DAILY  ?  ? Urology: 5-alpha Reductase Inhibitors Passed - 04/09/2021 12:52 PM  ?  ?  Passed - PSA in normal range and within 360 days  ?  PSA  ?Date Value Ref Range Status  ?08/27/2020 0.19 < OR = 4.00 ng/mL Final  ?  Comment:  ?  The total PSA value from this assay system is  ?standardized against the WHO standard. The test  ?result will be approximately 20% lower when compared  ?to the equimolar-standardized total PSA (Beckman  ?Coulter). Comparison of serial PSA results should be  ?interpreted with this fact in mind. ?. ?This test was performed using the Siemens  ?chemiluminescent method. Values obtained from  ?different assay methods cannot be used ?interchangeably. PSA levels, regardless of ?value, should not be interpreted as absolute ?evidence of the presence or absence of disease. ?  ?  ?  ?  ?  Passed - Valid encounter within last 12 months  ?  Recent Outpatient Visits   ? ?      ? 1 month ago Pre-diabetes  ? [place], DO  ? 7 months ago Pre-diabetes  ? [place], DO  ? 1 year ago Mixed hyperlipidemia  ? [facility] Lebron Conners D, MD  ? 1 year ago Mixed hyperlipidemia  ? [facility] Lebron Conners D, MD  ? 1 year ago Left lower quadrant abdominal pain  ? [facility] Towanda Malkin, MD  ? ?  ?  ?Future Appointments   ? ?        ? In 5 months Teodora Medici, [facility], [place]  ? In 6 months  [place]  ? ?  ? ?  ?  ?  ?  ?

## 2021-05-07 ENCOUNTER — Other Ambulatory Visit: Payer: Self-pay | Admitting: Family Medicine

## 2021-05-07 DIAGNOSIS — M159 Polyosteoarthritis, unspecified: Secondary | ICD-10-CM

## 2021-05-07 DIAGNOSIS — M47816 Spondylosis without myelopathy or radiculopathy, lumbar region: Secondary | ICD-10-CM

## 2021-05-08 NOTE — Telephone Encounter (Signed)
Requested medication (s) are due for refill today: yes ? ?Requested medication (s) are on the active medication list: yes   ? ?Last refill: 01/01/21  #270  0 refills ? ?Future visit scheduled yes 09/09/21 ? ?Notes to clinic:Failed due to abs, please review. Thank you. ? ?Requested Prescriptions  ?Pending Prescriptions Disp Refills  ? IBU 800 MG tablet [Pharmacy Med Name: IBUPROFEN 800 MG TAB] 270 tablet 0  ?  Sig: TAKE 1 TABLET BY MOUTH EVERY 8 HOURS AS NEEDED.**TAKE WITH FOOD**  ?  ? Analgesics:  NSAIDS Failed - 05/07/2021  1:45 PM  ?  ?  Failed - Manual Review: Labs are only required if the patient has taken medication for more than 8 weeks.  ?  ?  Failed - HGB in normal range and within 360 days  ?  Hemoglobin  ?Date Value Ref Range Status  ?09/01/2019 14.9 13.2 - 17.1 g/dL Final  ? ?HGB  ?Date Value Ref Range Status  ?08/18/2011 14.8 13.0 - 18.0 g/dL Final  ?  ?  ?  ?  Failed - PLT in normal range and within 360 days  ?  Platelets  ?Date Value Ref Range Status  ?09/01/2019 200 140 - 400 Thousand/uL Final  ? ?Platelet  ?Date Value Ref Range Status  ?08/18/2011 171 150 - 440 x10 3/mm 3 Final  ?  ?  ?  ?  Failed - HCT in normal range and within 360 days  ?  HCT  ?Date Value Ref Range Status  ?09/01/2019 43.4 38.5 - 50.0 % Final  ?08/18/2011 43.7 40.0 - 52.0 % Final  ?  ?  ?  ?  Failed - eGFR is 30 or above and within 360 days  ?  GFR, Est African American  ?Date Value Ref Range Status  ?09/01/2019 104 > OR = 60 mL/min/1.66m Final  ? ?GFR, Est Non African American  ?Date Value Ref Range Status  ?09/01/2019 89 > OR = 60 mL/min/1.775mFinal  ?  ?  ?  ?  Passed - Cr in normal range and within 360 days  ?  Creat  ?Date Value Ref Range Status  ?08/27/2020 0.98 0.70 - 1.35 mg/dL Final  ?  ?  ?  ?  Passed - Patient is not pregnant  ?  ?  Passed - Valid encounter within last 12 months  ?  Recent Outpatient Visits   ? ?      ? 1 month ago [place]-diabetes  ? [place]  ? 8 months ago  Pre-diabetes  ? [place]  ? 1 year [facility]  [facility]  ? [facility]  [facility]  ? 2 years ago Left lower quadrant abdominal pain  ? [facility]  ? ?  ?  ?Future Appointments   ? ?        ? [facility]? In 5 months  [place]? ?  ? ?  ?  ?  ? ? ? ? ?

## 2021-06-04 ENCOUNTER — Other Ambulatory Visit: Payer: Self-pay | Admitting: Internal Medicine

## 2021-06-04 DIAGNOSIS — N138 Other obstructive and reflux uropathy: Secondary | ICD-10-CM

## 2021-06-06 DIAGNOSIS — M5442 Lumbago with sciatica, left side: Secondary | ICD-10-CM | POA: Diagnosis not present

## 2021-06-06 DIAGNOSIS — M25551 Pain in right hip: Secondary | ICD-10-CM | POA: Diagnosis not present

## 2021-06-06 DIAGNOSIS — G8929 Other chronic pain: Secondary | ICD-10-CM | POA: Diagnosis not present

## 2021-06-06 DIAGNOSIS — M5441 Lumbago with sciatica, right side: Secondary | ICD-10-CM | POA: Diagnosis not present

## 2021-06-06 DIAGNOSIS — M25552 Pain in left hip: Secondary | ICD-10-CM | POA: Diagnosis not present

## 2021-06-06 NOTE — Telephone Encounter (Signed)
Requested Prescriptions  ?Pending Prescriptions Disp Refills  ?? tamsulosin (FLOMAX) 0.4 MG CAPS capsule [Pharmacy Med Name: TAMSULOSIN HCL 0.4 MG CAP] 30 capsule 2  ?  Sig: TAKE 1 CAPSULE BY MOUTH DAILY.  ?  ? Urology: Alpha-Adrenergic Blocker Passed - 06/04/2021 10:44 AM  ?  ?  Passed - PSA in normal range and within 360 days  ?  PSA  ?Date Value Ref Range Status  ?08/27/2020 0.19 < OR = 4.00 ng/mL Final  ?  Comment:  ?  The total PSA value from this assay system is  ?standardized against the WHO standard. The test  ?result will be approximately 20% lower when compared  ?to the equimolar-standardized total PSA (Beckman  ?Coulter). Comparison of serial PSA results should be  ?interpreted with this fact in mind. ?. ?This test was performed using the Siemens  ?chemiluminescent method. Values obtained from  ?different assay methods cannot be used ?interchangeably. PSA levels, regardless of ?value, should not be interpreted as absolute ?evidence of the presence or absence of disease. ?  ?   ?  ?  Passed - Last BP in normal range  ?  BP Readings from Last 1 Encounters:  ?03/11/21 118/72  ?   ?  ?  Passed - Valid encounter within last 12 months  ?  Recent Outpatient Visits   ?      ? 2 months ago Pre-diabetes  ? [place], DO  ? 9 months ago Pre-diabetes  ? [place], DO  ? 1 year ago Mixed hyperlipidemia  ? [facility] Lebron Conners D, MD  ? 1 year ago Mixed hyperlipidemia  ? [facility] Lebron Conners D, MD  ? 2 years ago Left lower quadrant abdominal pain  ? [facility] Towanda Malkin, MD  ?  ?  ?Future Appointments   ?        ? In 3 months Teodora Medici, [facility], [place]  ? In 4 months  [place]  ?  ? ?  ?  ?  ? ? ?

## 2021-07-16 ENCOUNTER — Other Ambulatory Visit: Payer: Self-pay | Admitting: Family Medicine

## 2021-07-16 DIAGNOSIS — K219 Gastro-esophageal reflux disease without esophagitis: Secondary | ICD-10-CM

## 2021-07-16 NOTE — Telephone Encounter (Signed)
Requested Prescriptions  Pending Prescriptions Disp Refills  . famotidine (PEPCID) 20 MG tablet [Pharmacy Med Name: FAMOTIDINE 20 MG TAB] 180 tablet 0    Sig: TAKE 1 TABLET BY MOUTH 2 TIMES DAILY AS NEEDED FOR HEARTBURN OR INDIGESTION     Gastroenterology:  H2 Antagonists Passed - 07/16/2021  9:25 AM      Passed - Valid encounter within last 12 months    Recent Outpatient Visits          4 months ago Pre-diabetes   [facility] Teodora Medici, DO   10 months ago Pre-diabetes   [facility] Myles Gip, DO   1 year ago Mixed hyperlipidemia   [place], MD   1 year ago Mixed hyperlipidemia   [place], MD   2 years ago Left lower quadrant abdominal pain   [facility] Towanda Malkin, MD      Future Appointments            In 1 month Teodora Medici, [facility], [place]   In 3 months  [facility], [facility]

## 2021-08-09 DIAGNOSIS — M5137 Other intervertebral disc degeneration, lumbosacral region: Secondary | ICD-10-CM | POA: Diagnosis not present

## 2021-08-09 DIAGNOSIS — M5117 Intervertebral disc disorders with radiculopathy, lumbosacral region: Secondary | ICD-10-CM | POA: Diagnosis not present

## 2021-08-27 ENCOUNTER — Ambulatory Visit (INDEPENDENT_AMBULATORY_CARE_PROVIDER_SITE_OTHER): Payer: Medicare Other

## 2021-08-27 ENCOUNTER — Ambulatory Visit (INDEPENDENT_AMBULATORY_CARE_PROVIDER_SITE_OTHER): Payer: Medicare Other | Admitting: Podiatry

## 2021-08-27 DIAGNOSIS — M7662 Achilles tendinitis, left leg: Secondary | ICD-10-CM

## 2021-08-27 DIAGNOSIS — R52 Pain, unspecified: Secondary | ICD-10-CM

## 2021-08-27 DIAGNOSIS — M5136 Other intervertebral disc degeneration, lumbar region: Secondary | ICD-10-CM | POA: Diagnosis not present

## 2021-08-27 MED ORDER — BETAMETHASONE SOD PHOS & ACET 6 (3-3) MG/ML IJ SUSP
3.0000 mg | Freq: Once | INTRAMUSCULAR | Status: AC
  Administered 2021-08-27: 3 mg via INTRA_ARTICULAR

## 2021-08-27 MED ORDER — METHYLPREDNISOLONE 4 MG PO TBPK: ORAL_TABLET | ORAL | 0 refills | Status: DC

## 2021-08-27 NOTE — Progress Notes (Signed)
   Chief Complaint  Patient presents with   Foot Pain    achilles pain    HPI: 71 y.o. male presenting today for new complaint of pain regarding posterior heel pain/Achilles tendinitis to the left lower extremity has been going on for a few weeks now.  Patient has not done anything for treatment currently.  He does take Motrin 800 mg 2 times daily for general body pain.  Denies a history of injury.  Presents for further treatment and evaluation  Past Medical History:  Diagnosis Date   Allergy    Arthritis    Diverticulosis    GERD (gastroesophageal reflux disease)    Hyperlipidemia     Past Surgical History:  Procedure Laterality Date   BACK SURGERY  01/17/2015   Dr Harl Bowie [facility]), Multiple diskectomy and herniated disc   CHOLECYSTECTOMY  07/07/2019   TOTAL KNEE ARTHROPLASTY Left 01/17/2019   Procedure: TOTAL KNEE ARTHROPLASTY;  Surgeon: Vickey Huger, MD;  Location: [facility];  Service: Orthopedics;  Laterality: Left;  75 mins needed for length of case. same day discharge   VASECTOMY      Allergies  Allergen Reactions   Metronidazole Hives   Tetanus Toxoid Adsorbed Hives     Physical Exam: General: The patient is alert and oriented x3 in no acute distress.  Dermatology: Skin is warm, dry and supple bilateral lower extremities. Negative for open lesions or macerations.  Vascular: Palpable pedal pulses bilaterally. No edema or erythema noted. Capillary refill within normal limits.  Neurological: Epicritic and protective threshold grossly intact bilaterally.   Musculoskeletal Exam: Pain on palpation noted to the posterior tubercle of the left calcaneus at the insertion of the Achilles tendon consistent with retrocalcaneal bursitis. Range of motion within normal limits. Muscle strength 5/5 in all muscle groups bilateral lower extremities.  Radiographic Exam:  Posterior calcaneal spur noted to the respective calcaneus on lateral view with small intrasubstance  calcification. No fracture or dislocation noted. Normal osseous mineralization noted.     Assessment: 1. Insertional Achilles tendinitis left 2.  Posterior heel spur left  Plan of Care:  1. Patient was evaluated. Radiographs were reviewed today. 2. Injection of 0.5 mL Celestone Soluspan injected into the retrocalcaneal bursa. Care was taken to avoid direct injection into the Achilles tendon. 3.  Prescription for Medrol Dosepak.  Then resume Motrin 800 mg twice daily 4.  Recommend good supportive shoes and sneakers 5.  Return to clinic as needed   Edrick Kins, DPM [facility]  Dr. Edrick Kins, DPM    [address], [place] [address]                Office [phone]  Fax [phone]

## 2021-08-29 DIAGNOSIS — M5136 Other intervertebral disc degeneration, lumbar region: Secondary | ICD-10-CM | POA: Diagnosis not present

## 2021-09-03 DIAGNOSIS — M5136 Other intervertebral disc degeneration, lumbar region: Secondary | ICD-10-CM | POA: Diagnosis not present

## 2021-09-05 DIAGNOSIS — M5136 Other intervertebral disc degeneration, lumbar region: Secondary | ICD-10-CM | POA: Diagnosis not present

## 2021-09-09 ENCOUNTER — Ambulatory Visit (INDEPENDENT_AMBULATORY_CARE_PROVIDER_SITE_OTHER): Payer: Medicare Other | Admitting: Internal Medicine

## 2021-09-09 ENCOUNTER — Encounter: Payer: Self-pay | Admitting: Internal Medicine

## 2021-09-09 VITALS — BP 118/64 | HR 75 | Temp 98.3°F | Resp 16 | Ht 70.0 in | Wt 250.5 lb

## 2021-09-09 DIAGNOSIS — M159 Polyosteoarthritis, unspecified: Secondary | ICD-10-CM

## 2021-09-09 DIAGNOSIS — Z8042 Family history of malignant neoplasm of prostate: Secondary | ICD-10-CM

## 2021-09-09 DIAGNOSIS — N138 Other obstructive and reflux uropathy: Secondary | ICD-10-CM

## 2021-09-09 DIAGNOSIS — K219 Gastro-esophageal reflux disease without esophagitis: Secondary | ICD-10-CM | POA: Diagnosis not present

## 2021-09-09 DIAGNOSIS — E782 Mixed hyperlipidemia: Secondary | ICD-10-CM

## 2021-09-09 DIAGNOSIS — Z125 Encounter for screening for malignant neoplasm of prostate: Secondary | ICD-10-CM

## 2021-09-09 DIAGNOSIS — N401 Enlarged prostate with lower urinary tract symptoms: Secondary | ICD-10-CM

## 2021-09-09 DIAGNOSIS — J3089 Other allergic rhinitis: Secondary | ICD-10-CM

## 2021-09-09 DIAGNOSIS — R7303 Prediabetes: Secondary | ICD-10-CM | POA: Diagnosis not present

## 2021-09-09 MED ORDER — TAMSULOSIN HCL 0.4 MG PO CAPS: 0.4000 mg | ORAL_CAPSULE | Freq: Every day | ORAL | 2 refills | Status: DC

## 2021-09-09 NOTE — Progress Notes (Signed)
Established Patient Office Visit  Subjective:  Patient ID: Vincent Guerra, male    DOB: [birth year]  Age: 71 y.o. MRN: [id number]  CC:  Chief Complaint  Patient presents with   Follow-up    6 months   Gastroesophageal Reflux   Hyperlipidemia    HPI Vincent Guerra presents for follow up on chronic medical conditions. Overall doing well.   Pre-Diabetes: -Last A1c 1/23 6.2%, not currently on any medications  -States that diet has been worse lately  HLD: -Medications: Simvastatin 40 mg -Patient is compliant with above medications and reports no side effects.  -Last lipid panel: Lipid Panel     Component Value Date/Time   CHOL 174 03/11/2021 1121   CHOL 183 08/18/2011 1510   TRIG 92 03/11/2021 1121   TRIG 139 08/18/2011 1510   HDL 52 03/11/2021 1121   HDL 42 08/18/2011 1510   CHOLHDL 3.3 03/11/2021 1121   VLDL 28 08/18/2011 1510   LDLCALC 103 (H) 03/11/2021 1121   LDLCALC 113 (H) 08/18/2011 1510   The 10-year ASCVD risk score (Arnett DK, et al., 2019) is: 14.6%   Values used to calculate the score:     Age: 35 years     Sex: Male     Is Non-Hispanic African American: No     Diabetic: No     Tobacco smoker: No     Systolic Blood Pressure: 545 mmHg     Is BP treated: No     HDL Cholesterol: 52 mg/dL     Total Cholesterol: 174 mg/dL  BPH: -Currently on Finasteride 5 mg and Flomax 0.4 mg daily.  -Last PSA 7/22 0.19, does have a family history of prostate cancer in his paternal grandfather and father - both of which were in their 35's when they were diagnosed but father lived to aeg 4.  GERD: -Currently on Pepcid 20 mg BID, doing well   Arthritis: -Taking Ibuprofen daily, uses Voltaren as well  Allergic Rhinitis: -Currently on Claritin and Flonase but only once a day (causes nose bleeds if used more frequently), occasionally using Afrin at night -History of broken nose several times, deviated septum   Health Maintenance: -Blood work due -Colon cancer  screening: Cologuard 2/23 negative   Past Medical History:  Diagnosis Date   Allergy    Arthritis    Diverticulosis    GERD (gastroesophageal reflux disease)    Hyperlipidemia     Past Surgical History:  Procedure Laterality Date   BACK SURGERY  01/17/2015   Dr Vincent Guerra), Multiple diskectomy and herniated disc   CHOLECYSTECTOMY  07/07/2019   TOTAL KNEE ARTHROPLASTY Left 01/17/2019   Procedure: TOTAL KNEE ARTHROPLASTY;  Surgeon: Vincent Huger, MD;  Location: [facility];  Service: Orthopedics;  Laterality: Left;  75 mins needed for length of case. 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, [employer])  Tobacco Use   Smoking status: Former    Packs/day: 1.00    Types: Cigarettes    Quit date: 1994    Years since quitting: 29.5   Smokeless tobacco: Never  Vaping Use   Vaping Use: Never used  Substance and Sexual Activity   Alcohol use: Yes    Alcohol/week: 24.0 standard drinks of alcohol    Types: 24 Cans of beer per week   Drug use: No   Sexual activity: Not on file  Other Topics Concern   Not on file  Social History Narrative   Not on file   Social Determinants of Health   Financial Resource Strain: Low Risk  (10/16/2020)   Overall Financial Resource Strain (CARDIA)    Difficulty of Paying Living Expenses: Not hard at all  Food Insecurity: No Food Insecurity (10/16/2020)   Hunger Vital Sign    Worried About Running Out of Food in the Last Year: Never true    [place] in the Last Year: Never true  Transportation Needs: No Transportation Needs (10/16/2020)   PRAPARE - Hydrologist (Medical): No    Lack of Transportation (Non-Medical): No  Physical Activity: Insufficiently Active (10/16/2020)   Exercise Vital Sign    Days of Exercise per Week: 7 days    Minutes of Exercise per Session: 20 min  Stress: No Stress Concern Present (10/16/2020)   Vincent Guerra    Feeling of Stress : Not at all  Social Connections: Moderately Isolated (10/16/2020)   Social Connection and Isolation Panel [NHANES]    Frequency of Communication with Friends and Family: More than three times a week    Frequency of Social Gatherings with Friends and Family: Twice a week    Attends Religious Services: Never    Marine scientist or Organizations: No    Attends Archivist Meetings: Never    Marital Status: Married  Human resources officer Violence: Not At Risk (10/16/2020)   Humiliation, Afraid, Rape, and Kick questionnaire    Fear of Current or Ex-Partner: No    Emotionally Abused: No    Physically Abused: No    Sexually Abused: No    Outpatient Medications Prior to Visit  Medication Sig Dispense Refill   aspirin 81 MG chewable tablet Chew by mouth daily.     diclofenac sodium (VOLTAREN) 1 % GEL Apply 2 g topically 3 (three) times daily as needed. Use for hands and knee arthritis, joint pain (Patient taking differently: Apply 2 g topically 3 (three) times daily as needed (shoulder pain/hands & knee pain.).) 100 g 2   famotidine (PEPCID) 20 MG tablet TAKE 1 TABLET BY MOUTH 2 TIMES DAILY AS NEEDED FOR HEARTBURN OR INDIGESTION 180 tablet 0   finasteride (PROSCAR) 5 MG tablet TAKE 1 TABLET BY MOUTH DAILY 90 tablet 3   fluticasone (FLONASE) 50 MCG/ACT nasal spray SPRAY TWICE INTO EACH NOSTRIL EVERY MORNING 9.9 mL 2   IBU 800 MG tablet TAKE 1 TABLET BY MOUTH EVERY 8 HOURS AS  NEEDED.**TAKE WITH FOOD** 270 tablet 0   loratadine (CLARITIN) 10 MG tablet Take 10 mg by mouth 2 (two) times daily.      Magnesium 500 MG TABS Take 500 mg by mouth at bedtime.     methylPREDNISolone (MEDROL DOSEPAK) 4 MG TBPK tablet 6 day dose pack - take as directed 21 tablet 0  Multiple Vitamin (MULTIVITAMIN WITH MINERALS) TABS tablet Take 1 tablet by mouth at bedtime.     Omega-3 Fatty Acids (FISH OIL) 1200 MG CAPS Take 1,200 mg by mouth 2 (two) times daily.      Potassium 99 MG TABS Take 1 tablet by mouth in the morning.     Turmeric 500 MG CAPS Take 500 mg by mouth every evening.     simvastatin (ZOCOR) 40 MG tablet Take 1 tablet (40 mg total) by mouth at bedtime. 90 tablet 3   tamsulosin (FLOMAX) 0.4 MG CAPS capsule TAKE 1 CAPSULE BY MOUTH DAILY. 30 capsule 2   No facility-administered medications prior to visit.    Allergies  Allergen Reactions   Metronidazole Hives   Tetanus Toxoid Adsorbed Hives    ROS Review of Systems  Constitutional:  Negative for chills and fever.  Eyes:  Negative for visual disturbance.  Respiratory:  Negative for cough.   Cardiovascular:  Negative for chest pain.  Musculoskeletal:  Positive for arthralgias.      Objective:    Physical Exam Constitutional:      Appearance: Normal appearance.  HENT:     Head: Normocephalic and atraumatic.  Eyes:     Conjunctiva/sclera: Conjunctivae normal.  Cardiovascular:     Rate and Rhythm: Normal rate and regular rhythm.  Pulmonary:     Effort: Pulmonary effort is normal.     Breath sounds: Normal breath sounds.  Musculoskeletal:     Right lower leg: No edema.     Left lower leg: No edema.  Skin:    General: Skin is warm and dry.  Neurological:     General: No focal deficit present.     Mental Status: He is alert. Mental status is at baseline.  Psychiatric:        Mood and Affect: Mood normal.        Behavior: Behavior normal.     BP 118/64   Pulse 75   Temp 98.3 F (36.8 C)   Resp 16    Ht '5\' 10"'$  (1.778 m)   Wt 250 lb 8 oz (113.6 kg)   SpO2 98%   BMI 35.94 kg/m  Wt Readings from Last 3 Encounters:  09/09/21 250 lb 8 oz (113.6 kg)  03/11/21 245 lb 4.8 oz (111.3 kg)  08/27/20 247 lb 1.6 oz (112.1 kg)     Health Maintenance Due  Topic Date Due   Zoster Vaccines- Shingrix (1 of 2) Never done   COVID-19 Vaccine (4 - Pfizer series) 02/01/2020    There are no preventive care reminders to display for this patient.  Lab Results  Component Value Date   TSH 2.21 11/11/2016   Lab Results  Component Value Date   WBC 9.0 09/01/2019   HGB 14.9 09/01/2019   HCT 43.4 09/01/2019   MCV 91.4 09/01/2019   PLT 200 09/01/2019   Lab Results  Component Value Date   NA 143 08/27/2020   K 4.6 08/27/2020   CO2 27 08/27/2020   GLUCOSE 88 08/27/2020   BUN 18 08/27/2020   CREATININE 0.98 08/27/2020   BILITOT 0.4 09/01/2019   ALKPHOS 49 07/04/2019   AST 19 09/01/2019   ALT 25 09/01/2019   PROT 7.0 09/01/2019   ALBUMIN 4.3 07/04/2019   CALCIUM 9.8 08/27/2020   ANIONGAP 8 07/04/2019   Lab Results  Component Value Date   CHOL 174 03/11/2021   Lab Results  Component Value Date   HDL 52 03/11/2021   Lab  Results  Component Value Date   LDLCALC 103 (H) 03/11/2021   Lab Results  Component Value Date   TRIG 92 03/11/2021   Lab Results  Component Value Date   CHOLHDL 3.3 03/11/2021   Lab Results  Component Value Date   HGBA1C 6.2 (H) 03/11/2021      Assessment & Plan:   1. Pre-diabetes: Check A1c, CBC, CMP today. Had lost weight in the past and A1c was good but starting to increase in January. Patient states diet has not been good lately. Discussed starting Metformin to prevent diabetes, patient would like to discuss after new A1c results.   - CBC w/Diff/Platelet - COMPLETE METABOLIC PANEL WITH GFR - HgB A1c  2. Mixed hyperlipidemia: Reviewed results from last time, LDL 103, ASCVD risk 14.6%. Continue Zocor 40 mg and diet changes. Given list of foods and  cholesterol content. Consider switching to Lipitor next time.   3. Prostate cancer screening/Family history of prostate cancer: Check PSA today.  - PSA  4. BPH with obstruction/lower urinary tract symptoms: Continue Flomax 0.4 mg daily, refilled today. Continue Finasteride 5 mg as well.   - tamsulosin (FLOMAX) 0.4 MG CAPS capsule; Take 1 capsule (0.4 mg total) by mouth daily.  Dispense: 90 capsule; Refill: 2  5. Non-seasonal allergic rhinitis, unspecified trigger: Stable, continue Claritin 10 mg and Flonase for symptoms. Discussed Afrin use - ok to use occasionally.   6. Gastroesophageal reflux disease without esophagitis: Stable, continue Pepcid 20 mg BID.  7. Primary osteoarthritis involving multiple joints: Pain at multiple sites, worse in right shoulder. Using Voltaren and taking anti-inflammatories.   Follow-up: Return in about 6 months (around 03/12/2022).    Teodora Medici, DO

## 2021-09-09 NOTE — Patient Instructions (Addendum)
It was great seeing you today!  Plan discussed at today's visit: -Blood work ordered today, results will be uploaded to MyChart.  -Continue Zocor 40 mg daily and continue to work on diet - decrease red meats and fatty processed foods. Stick to lean proteins like chicken, [place] and fish -Tamulosin refilled today   Follow up in: 6 months   Take care and let us know if you have any questions or concerns prior to your next visit.  Dr. Rosana Berger  Cholesterol Content in Foods Cholesterol is a waxy, fat-like substance that helps to carry fat in the blood. The body needs cholesterol in small amounts, but too much cholesterol can cause damage to the arteries and heart. What foods have cholesterol?  Cholesterol is found in animal-based foods, such as meat, seafood, and dairy. Generally, low-fat dairy and lean meats have less cholesterol than full-fat dairy and fatty meats. The milligrams of cholesterol per serving (mg per serving) of common cholesterol-containing foods are listed below. Meats and other proteins Egg -- one large whole egg has 186 mg. Veal shank -- 4 oz (113 g) has 141 mg. Lean ground [place] (93% lean) -- 4 oz (113 g) has 118 mg. Fat-trimmed lamb loin -- 4 oz (113 g) has 106 mg. Lean ground beef (90% lean) -- 4 oz (113 g) has 100 mg. Lobster -- 3.5 oz (99 g) has 90 mg. Pork loin chops -- 4 oz (113 g) has 86 mg. Canned salmon -- 3.5 oz (99 g) has 83 mg. Fat-trimmed beef top loin -- 4 oz (113 g) has 78 mg. Frankfurter -- 1 frank (3.5 oz or 99 g) has 77 mg. Crab -- 3.5 oz (99 g) has 71 mg. Roasted chicken without skin, white meat -- 4 oz (113 g) has 66 mg. Light bologna -- 2 oz (57 g) has 45 mg. Deli-cut [place] -- 2 oz (57 g) has 31 mg. Canned tuna -- 3.5 oz (99 g) has 31 mg. Berniece Salines -- 1 oz (28 g) has 29 mg. Oysters and mussels (raw) -- 3.5 oz (99 g) has 25 mg. Mackerel -- 1 oz (28 g) has 22 mg. Trout -- 1 oz (28 g) has 20 mg. Pork sausage -- 1 link (1 oz or 28 g) has 17  mg. Salmon -- 1 oz (28 g) has 16 mg. Tilapia -- 1 oz (28 g) has 14 mg. Dairy Soft-serve ice cream --  cup (4 oz or 86 g) has 103 mg. Whole-milk yogurt -- 1 cup (8 oz or 245 g) has 29 mg. Cheddar cheese -- 1 oz (28 g) has 28 mg. American cheese -- 1 oz (28 g) has 28 mg. Whole milk -- 1 cup (8 oz or 250 mL) has 23 mg. 2% milk -- 1 cup (8 oz or 250 mL) has 18 mg. Cream cheese -- 1 tablespoon (Tbsp) (14.5 g) has 15 mg. Cottage cheese --  cup (4 oz or 113 g) has 14 mg. Low-fat (1%) milk -- 1 cup (8 oz or 250 mL) has 10 mg. Sour cream -- 1 Tbsp (12 g) has 8.5 mg. Low-fat yogurt -- 1 cup (8 oz or 245 g) has 8 mg. Nonfat Greek yogurt -- 1 cup (8 oz or 228 g) has 7 mg. Half-and-half cream -- 1 Tbsp (15 mL) has 5 mg. Fats and oils Cod liver oil -- 1 tablespoon (Tbsp) (13.6 g) has 82 mg. Butter -- 1 Tbsp (14 g) has 15 mg. Lard -- 1 Tbsp (12.8 g) has 14 mg.  Bacon grease -- 1 Tbsp (12.9 g) has 14 mg. Mayonnaise -- 1 Tbsp (13.8 g) has 5-10 mg. Margarine -- 1 Tbsp (14 g) has 3-10 mg. The items listed above may not be a complete list of foods with cholesterol. Exact amounts of cholesterol in these foods may vary depending on specific ingredients and brands. Contact a dietitian for more information. What foods do not have cholesterol? Most plant-based foods do not have cholesterol unless you combine them with a food that has cholesterol. Foods without cholesterol include: Grains and cereals. Vegetables. Fruits. Vegetable oils, such as olive, canola, and sunflower oil. Legumes, such as peas, beans, and lentils. Nuts and seeds. Egg whites. The items listed above may not be a complete list of foods that do not have cholesterol. Contact a dietitian for more information. Summary The body needs cholesterol in small amounts, but too much cholesterol can cause damage to the arteries and heart. Cholesterol is found in animal-based foods, such as meat, seafood, and dairy. Generally, low-fat dairy and  lean meats have less cholesterol than full-fat dairy and fatty meats. This information is not intended to replace advice given to you by your health care provider. Make sure you discuss any questions you have with your health care provider. Document Revised: 06/08/2020 Document Reviewed: 06/08/2020 Elsevier Patient Education  [place].

## 2021-09-10 LAB — CBC WITH DIFFERENTIAL/PLATELET
Absolute Monocytes: 850 cells/uL (ref 200–950)
Basophils Absolute: 68 cells/uL (ref 0–200)
Basophils Relative: 0.8 %
Eosinophils Absolute: 187 cells/uL (ref 15–500)
Eosinophils Relative: 2.2 %
HCT: 44.8 % (ref 38.5–50.0)
Hemoglobin: 15.1 g/dL (ref 13.2–17.1)
Lymphs Abs: 2100 cells/uL (ref 850–3900)
MCH: 30.9 pg (ref 27.0–33.0)
MCHC: 33.7 g/dL (ref 32.0–36.0)
MCV: 91.6 fL (ref 80.0–100.0)
MPV: 9.7 fL (ref 7.5–12.5)
Monocytes Relative: 10 %
Neutro Abs: 5296 cells/uL (ref 1500–7800)
Neutrophils Relative %: 62.3 %
Platelets: 176 10*3/uL (ref 140–400)
RBC: 4.89 10*6/uL (ref 4.20–5.80)
RDW: 13.6 % (ref 11.0–15.0)
Total Lymphocyte: 24.7 %
WBC: 8.5 10*3/uL (ref 3.8–10.8)

## 2021-09-10 LAB — COMPLETE METABOLIC PANEL WITH GFR
AG Ratio: 2 (calc) (ref 1.0–2.5)
ALT: 43 U/L (ref 9–46)
AST: 22 U/L (ref 10–35)
Albumin: 4.3 g/dL (ref 3.6–5.1)
Alkaline phosphatase (APISO): 55 U/L (ref 35–144)
BUN: 15 mg/dL (ref 7–25)
CO2: 24 mmol/L (ref 20–32)
Calcium: 9.1 mg/dL (ref 8.6–10.3)
Chloride: 107 mmol/L (ref 98–110)
Creat: 0.93 mg/dL (ref 0.70–1.28)
Globulin: 2.2 g/dL (calc) (ref 1.9–3.7)
Glucose, Bld: 113 mg/dL — ABNORMAL HIGH (ref 65–99)
Potassium: 4.6 mmol/L (ref 3.5–5.3)
Sodium: 139 mmol/L (ref 135–146)
Total Bilirubin: 0.5 mg/dL (ref 0.2–1.2)
Total Protein: 6.5 g/dL (ref 6.1–8.1)
eGFR: 88 mL/min/{1.73_m2} (ref >=60)

## 2021-09-10 LAB — HEMOGLOBIN A1C
Hgb A1c MFr Bld: 6.1 % of total Hgb — ABNORMAL HIGH (ref <5.7)
Mean Plasma Glucose: 128 mg/dL
eAG (mmol/L): 7.1 mmol/L

## 2021-09-10 LAB — PSA: PSA: 0.17 ng/mL (ref <=4.00)

## 2021-09-11 DIAGNOSIS — M5136 Other intervertebral disc degeneration, lumbar region: Secondary | ICD-10-CM | POA: Diagnosis not present

## 2021-09-13 DIAGNOSIS — C44319 Basal cell carcinoma of skin of other parts of face: Secondary | ICD-10-CM | POA: Diagnosis not present

## 2021-09-13 DIAGNOSIS — D485 Neoplasm of uncertain behavior of skin: Secondary | ICD-10-CM | POA: Diagnosis not present

## 2021-09-13 DIAGNOSIS — L570 Actinic keratosis: Secondary | ICD-10-CM | POA: Diagnosis not present

## 2021-09-13 DIAGNOSIS — D2261 Melanocytic nevi of right upper limb, including shoulder: Secondary | ICD-10-CM | POA: Diagnosis not present

## 2021-09-13 DIAGNOSIS — D2262 Melanocytic nevi of left upper limb, including shoulder: Secondary | ICD-10-CM | POA: Diagnosis not present

## 2021-09-13 DIAGNOSIS — Z85828 Personal history of other malignant neoplasm of skin: Secondary | ICD-10-CM | POA: Diagnosis not present

## 2021-09-13 DIAGNOSIS — D2272 Melanocytic nevi of left lower limb, including hip: Secondary | ICD-10-CM | POA: Diagnosis not present

## 2021-09-13 DIAGNOSIS — X32XXXA Exposure to sunlight, initial encounter: Secondary | ICD-10-CM | POA: Diagnosis not present

## 2021-09-16 DIAGNOSIS — M5136 Other intervertebral disc degeneration, lumbar region: Secondary | ICD-10-CM | POA: Diagnosis not present

## 2021-09-19 DIAGNOSIS — M5136 Other intervertebral disc degeneration, lumbar region: Secondary | ICD-10-CM | POA: Diagnosis not present

## 2021-09-23 DIAGNOSIS — M5136 Other intervertebral disc degeneration, lumbar region: Secondary | ICD-10-CM | POA: Diagnosis not present

## 2021-09-24 ENCOUNTER — Other Ambulatory Visit: Payer: Self-pay | Admitting: Family Medicine

## 2021-09-24 DIAGNOSIS — E782 Mixed hyperlipidemia: Secondary | ICD-10-CM

## 2021-09-24 NOTE — Telephone Encounter (Signed)
Requested Prescriptions  Pending Prescriptions Disp Refills  . simvastatin (ZOCOR) 40 MG tablet [Pharmacy Med Name: SIMVASTATIN 40 MG TAB] 90 tablet 3    Sig: TAKE 1 TABLET BY MOUTH AT BEDTIME     Cardiovascular:  Antilipid - Statins Failed - 09/24/2021  8:35 AM      Failed - Lipid Panel in normal range within the last 12 months    Cholesterol  Date Value Ref Range Status  03/11/2021 174 <200 mg/dL Final  08/18/2011 183 0 - 200 mg/dL Final   Ldl Cholesterol, Calc  Date Value Ref Range Status  08/18/2011 113 (H) 0 - 100 mg/dL Final   LDL Cholesterol (Calc)  Date Value Ref Range Status  03/11/2021 103 (H) mg/dL (calc) Final    Comment:    Reference range: <100 . Desirable range <100 mg/dL for primary prevention;   <70 mg/dL for patients with CHD or diabetic patients  with > or = 2 CHD risk factors. Marland Kitchen LDL-C is now calculated using the Martin-Hopkins  calculation, which is a validated novel method providing  better accuracy than the Friedewald equation in the  estimation of LDL-C.  Cresenciano Genre et al. Annamaria Helling. 2706;237(62): 2061-2068  (http://education.[employer].com/faq/FAQ164)    HDL Cholesterol  Date Value Ref Range Status  08/18/2011 42 40 - 60 mg/dL Final   HDL  Date Value Ref Range Status  03/11/2021 52 > OR = 40 mg/dL Final   Triglycerides  Date Value Ref Range Status  03/11/2021 92 <150 mg/dL Final  08/18/2011 139 0 - 200 mg/dL Final         Passed - Patient is not pregnant      Passed - Valid encounter within last 12 months    Recent Outpatient Visits          2 weeks ago Pre-diabetes   [facility] Teodora Medici, DO   6 months ago Pre-diabetes   [facility] Teodora Medici, DO   1 year ago Pre-diabetes   [facility] Myles Gip, DO   1 year ago Mixed hyperlipidemia   [place], MD   2 years ago Mixed hyperlipidemia   [place], MD      Future Appointments            In 3 weeks  [facility], [place]   In 5 months Teodora Medici, [facility], [facility]

## 2021-09-26 DIAGNOSIS — M5136 Other intervertebral disc degeneration, lumbar region: Secondary | ICD-10-CM | POA: Diagnosis not present

## 2021-10-07 DIAGNOSIS — L578 Other skin changes due to chronic exposure to nonionizing radiation: Secondary | ICD-10-CM | POA: Diagnosis not present

## 2021-10-07 DIAGNOSIS — L988 Other specified disorders of the skin and subcutaneous tissue: Secondary | ICD-10-CM | POA: Diagnosis not present

## 2021-10-07 DIAGNOSIS — C44319 Basal cell carcinoma of skin of other parts of face: Secondary | ICD-10-CM | POA: Diagnosis not present

## 2021-10-07 DIAGNOSIS — L814 Other melanin hyperpigmentation: Secondary | ICD-10-CM | POA: Diagnosis not present

## 2021-10-08 ENCOUNTER — Other Ambulatory Visit: Payer: Self-pay | Admitting: Internal Medicine

## 2021-10-08 DIAGNOSIS — M47816 Spondylosis without myelopathy or radiculopathy, lumbar region: Secondary | ICD-10-CM

## 2021-10-08 DIAGNOSIS — M159 Polyosteoarthritis, unspecified: Secondary | ICD-10-CM

## 2021-10-08 DIAGNOSIS — K219 Gastro-esophageal reflux disease without esophagitis: Secondary | ICD-10-CM

## 2021-10-08 NOTE — Telephone Encounter (Signed)
Requested Prescriptions  Pending Prescriptions Disp Refills  . famotidine (PEPCID) 20 MG tablet [Pharmacy Med Name: FAMOTIDINE 20 MG TAB] 180 tablet 0    Sig: TAKE 1 TABLET BY MOUTH 2 TIMES DAILY AS NEEDED FOR HEARTBURN OR INDIGESTION     Gastroenterology:  H2 Antagonists Passed - 10/08/2021  9:18 AM      Passed - Valid encounter within last 12 months    Recent Outpatient Visits          4 weeks ago Pre-diabetes   [facility] Teodora Medici, DO   7 months ago Pre-diabetes   [facility] Teodora Medici, DO   1 year ago Pre-diabetes   [facility] Myles Gip, DO   1 year ago Mixed hyperlipidemia   [facility] Tow[place] Malkin, MD   2 years ago Mixed hyperlipidemia   [place], MD      Future Appointments            In 1 week  [facility], [employer]   In 5 months Teodora Medici, [facility], [place]           . IBU 800 MG tablet [Pharmacy Med Name: IBUPROFEN 800 MG TAB] 270 tablet 0    Sig: TAKE 1 TABLET BY MOUTH EVERY 8 HOURS AS NEEDED.**TAKE WITH FOOD**     Analgesics:  NSAIDS Failed - 10/08/2021  9:18 AM      Failed - Manual Review: Labs are only required if the patient has taken medication for more than 8 weeks.      Passed - Cr in normal range and within 360 days    Creat  Date Value Ref Range Status  09/09/2021 0.93 0.70 - 1.28 mg/dL Final         Passed - HGB in normal range and within 360 days    Hemoglobin  Date Value Ref Range Status  09/09/2021 15.1 13.2 - 17.1 g/dL Final   HGB  Date Value Ref Range Status  08/18/2011 14.8 13.0 - 18.0 g/dL Final         Passed - PLT in normal range and within 360 days    Platelets  Date Value Ref Range Status  09/09/2021 176 140 - 400 Thousand/uL Final   Platelet  Date Value Ref Range Status  08/18/2011 171 150 - 440 x10 3/mm 3 Final          Passed - HCT in normal range and within 360 days    HCT  Date Value Ref Range Status  09/09/2021 44.8 38.5 - 50.0 % Final  08/18/2011 43.7 40.0 - 52.0 % Final         Passed - eGFR is 30 or above and within 360 days    GFR, Est African American  Date Value Ref Range Status  09/01/2019 104 > OR = 60 mL/min/1.12m Final   GFR, Est Non African American  Date Value Ref Range Status  09/01/2019 89 > OR = 60 mL/min/1.715mFinal   eGFR  Date Value Ref Range Status  09/09/2021 88 > OR = 60 mL/min/1.7321minal         Passed - Patient is not pregnant      Passed - Valid encounter within last 12 months    Recent Outpatient Visits          4 weeks ago [facility]di[place]   [facility]  [place], [place]   7 months ago Pre-diabetes   [facility] Teodora Medici, DO   1 year ago Pre-diabetes   [facility] Myles Gip, DO   1 year ago Mixed hyperlipidemia   [facility] Tow[place] Malkin, MD   2 years ago Mixed hyperlipidemia   [place], MD      Future Appointments            In 1 week  [facility], [facility]   In 5 months Teodora Medici, [facility], [facility]

## 2021-10-11 DIAGNOSIS — M48062 Spinal stenosis, lumbar region with neurogenic claudication: Secondary | ICD-10-CM | POA: Diagnosis not present

## 2021-10-11 DIAGNOSIS — M5117 Intervertebral disc disorders with radiculopathy, lumbosacral region: Secondary | ICD-10-CM | POA: Diagnosis not present

## 2021-10-17 ENCOUNTER — Other Ambulatory Visit: Payer: Self-pay | Admitting: Physician Assistant

## 2021-10-17 ENCOUNTER — Other Ambulatory Visit (HOSPITAL_COMMUNITY): Payer: Self-pay | Admitting: Physician Assistant

## 2021-10-17 ENCOUNTER — Ambulatory Visit (INDEPENDENT_AMBULATORY_CARE_PROVIDER_SITE_OTHER): Payer: Medicare Other

## 2021-10-17 VITALS — BP 142/85 | HR 72 | Temp 97.8°F | Ht 70.5 in | Wt 250.0 lb

## 2021-10-17 DIAGNOSIS — Z0000 Encounter for general adult medical examination without abnormal findings: Secondary | ICD-10-CM | POA: Diagnosis not present

## 2021-10-17 DIAGNOSIS — M5137 Other intervertebral disc degeneration, lumbosacral region: Secondary | ICD-10-CM

## 2021-10-17 DIAGNOSIS — M48062 Spinal stenosis, lumbar region with neurogenic claudication: Secondary | ICD-10-CM

## 2021-10-17 NOTE — Progress Notes (Signed)
Subjective:  I connected with  Vincent Guerra on 10/17/21 by a audio enabled telemedicine application and verified that I am speaking with the correct person using two identifiers.  Patient Location: Home  Provider Location: Office/Clinic  I discussed the limitations of evaluation and management by telemedicine. The patient expressed understanding and agreed to proceed.  Vincent Guerra is a 71 y.o. male who presents for Medicare Annual/Subsequent preventive examination.  Review of Systems    Defer to PCP       Objective:    There were no vitals filed for this visit. There is no height or weight on file to calculate BMI.     10/16/2020    8:20 AM 10/13/2019   10:08 AM 01/17/2019    6:10 AM 01/04/2019    9:28 AM 10/08/2018   10:08 AM 08/25/2017   10:12 AM  Advanced Directives  Does Patient Have a Medical Advance Directive? No No No No No Yes  Does patient want to make changes to medical advance directive? No - Patient declined       Would patient like information on creating a medical advance directive?  No - Patient declined No - Patient declined  Yes (MAU/Ambulatory/Procedural Areas - Information given)     Current Medications (verified) Outpatient Encounter Medications as of 10/17/2021  Medication Sig   aspirin 81 MG chewable tablet Chew by mouth daily.   diclofenac sodium (VOLTAREN) 1 % GEL Apply 2 g topically 3 (three) times daily as needed. Use for hands and knee arthritis, joint pain (Patient taking differently: Apply 2 g topically 3 (three) times daily as needed (shoulder pain/hands & knee pain.).)   famotidine (PEPCID) 20 MG tablet TAKE 1 TABLET BY MOUTH 2 TIMES DAILY AS NEEDED FOR HEARTBURN OR INDIGESTION   finasteride (PROSCAR) 5 MG tablet TAKE 1 TABLET BY MOUTH DAILY   fluticasone (FLONASE) 50 MCG/ACT nasal spray SPRAY TWICE INTO EACH NOSTRIL EVERY MORNING   IBU 800 MG tablet TAKE 1 TABLET BY MOUTH EVERY 8 HOURS AS NEEDED.**TAKE WITH FOOD**   loratadine  (CLARITIN) 10 MG tablet Take 10 mg by mouth 2 (two) times daily.    Magnesium 500 MG TABS Take 500 mg by mouth at bedtime.   methylPREDNISolone (MEDROL DOSEPAK) 4 MG TBPK tablet 6 day dose pack - take as directed   Multiple Vitamin (MULTIVITAMIN WITH MINERALS) TABS tablet Take 1 tablet by mouth at bedtime.   Omega-3 Fatty Acids (FISH OIL) 1200 MG CAPS Take 1,200 mg by mouth 2 (two) times daily.    Potassium 99 MG TABS Take 1 tablet by mouth in the morning.   simvastatin (ZOCOR) 40 MG tablet TAKE 1 TABLET BY MOUTH AT BEDTIME   tamsulosin (FLOMAX) 0.4 MG CAPS capsule Take 1 capsule (0.4 mg total) by mouth daily.   Turmeric 500 MG CAPS Take 500 mg by mouth every evening.   No facility-administered encounter medications on file as of 10/17/2021.    Allergies (verified) Metronidazole and Tetanus toxoid adsorbed   History: Past Medical History:  Diagnosis Date   Allergy    Arthritis    Diverticulosis    GERD (gastroesophageal reflux disease)    Hyperlipidemia    Past Surgical History:  Procedure Laterality Date   BACK SURGERY  01/17/2015   Dr Harl [facility]), Multiple diskectomy and herniated disc   CHOLECYSTECTOMY  07/07/2019   TOTAL KNEE ARTHROPLASTY Left 01/17/2019   Procedure: TOTAL KNEE ARTHROPLASTY;  Surgeon: Vickey Huger, MD;  Location: [facility];  Service: Orthopedics;  Laterality: Left;  75 mins needed for length of case. 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, Street)   Tobacco Use   Smoking status: Former    Packs/day: 1.00    Types: Cigarettes    Quit date: 1994    Years since quitting: 29.7   Smokeless tobacco: Never  Vaping Use   Vaping Use: Never used  Substance and Sexual Activity   Alcohol use: Yes    Alcohol/week: 24.0 standard drinks of alcohol    Types: 24 Cans of beer per week   Drug use: No   Sexual activity: Not on file  Other Topics Concern   Not on file  Social History Narrative   Not on file   Social Determinants of Health   Financial Resource Strain: Low Risk  (10/16/2020)   Overall Financial Resource Strain (CARDIA)    Difficulty of Paying Living Expenses: Not hard at all  Food Insecurity: No Food Insecurity (10/16/2020)   Hunger Vital Sign    Worried About Running Out of Food in the Last Year: Never true    Browns in the Last Year: Never true  Transportation Needs: No Transportation Needs (10/16/2020)   PRAPARE - Hydrologist (Medical): No    Lack of Transportation (Non-Medical): No  Physical Activity: Insufficiently Active (10/16/2020)   Exercise Vital Sign    Days of Exercise per Week: 7 days    Minutes of Exercise per Session: 20 min  Stress: No Stress Concern Present (10/16/2020)   [place]    Feeling of Stress : Not at all  Social Connections: Moderately Isolated (10/16/2020)   Social Connection and Isolation Panel [NHANES]    Frequency of Communication with Friends and Family: More than three times a week    Frequency of Social Gatherings with Friends and Family: Twice a week    Attends Religious Services: Never    Marine scientist or Organizations: No    Attends Archivist Meetings: Never    Marital Status: Married    Tobacco Counseling Counseling given: Not Answered   Clinical Intake:                 Diabetic?No         Activities of Daily Living    09/09/2021    9:26 AM  03/11/2021   10:45 AM  In your present state of health, do you have any difficulty performing the following activities:  Hearing? 1 1  Vision? 0 0  Difficulty concentrating or making decisions? 1 0  Walking or climbing stairs? 0 0  Dressing or bathing? 0 0  Doing errands, shopping? 0 0    Patient Care Team: Teodora Medici, DO as PCP - General (Internal Medicine) Dermatology,  [place]  Indicate any recent Medical Services you may have received from other than [facility] providers in the past year (date may be approximate).     Assessment:   This is a routine wellness examination for Vincent Guerra.  Hearing/Vision screen No results found.  Dietary issues and exercise activities discussed:     Goals Addressed   None   Depression Screen    09/09/2021    9:26 AM 03/11/2021   10:45 AM 10/16/2020    8:19 AM 08/27/2020    2:49 PM 03/01/2020    9:15 AM 10/13/2019   10:07 AM 09/01/2019    9:06 AM  PHQ 2/9 Scores  PHQ - 2 Score 0 0 0 0 0 0 0  PHQ- 9 Score 0 0     0    Fall Risk    09/09/2021    9:26 AM 03/11/2021   10:45 AM 10/16/2020    8:21 AM 08/27/2020    2:48 PM 03/01/2020    9:15 AM  Fall Risk   Falls in the past year? 0 0  0 0  Number falls in past yr: 0 0 0 0 0  Injury with Fall? 0 0 0 0 0  Risk for fall due to :   No Fall Risks    Follow up   Falls prevention discussed Falls evaluation completed     FALL RISK PREVENTION PERTAINING TO THE HOME:  Any stairs in or around the home? Yes  If so, are there any without handrails? No  Home free of loose throw rugs in walkways, pet beds, electrical cords, etc? Yes  Adequate lighting in your home to reduce risk of falls? Yes   ASSISTIVE DEVICES UTILIZED TO PREVENT FALLS:  Life alert? No  Use of a cane, walker or w/c? No  Grab bars in the bathroom? Yes  Shower chair or bench in shower? Yes  Elevated toilet seat or a handicapped toilet? Yes   TIMED UP AND GO:  Was the test performed? No .  Length of time to ambulate 10 feet: N/A  sec.     Cognitive Function:    08/25/2017   10:24 AM  MMSE - Mini Mental State Exam  Orientation to time 5  Orientation to Place 5  Registration 3  Attention/ Calculation 5  Recall 3  Language- name 2 objects 2  Language- repeat 1  Language- follow 3 step command 3  Language- read & follow direction 1  Write a sentence 1  Copy design 1  Total score 30        10/13/2019   10:09 AM 10/08/2018   10:11 AM  6CIT Screen  What Year? 0 points 0 points  What month? 0 points 0 points  What time? 0 points 0 points  Count back from 20 0 points 0 points  Months in reverse 0 points 0 points  Repeat phrase 0 points 0 points  Total Score 0 points 0 points    Immunizations Immunization History  Administered Date(s) Administered   Fluad Quad(high Dose 65+) 11/09/2018, 03/01/2020   Influenza-Unspecified 12/02/2017, 02/10/2021   PFIZER(Purple Top)SARS-COV-2 Vaccination 03/24/2019, 04/14/2019, 12/07/2019   Pneumococcal Conjugate-13 03/02/2017   Pneumococcal Polysaccharide-23 09/01/2018   Zoster Recombinat (Shingrix) 06/20/2021, 09/18/2021    TDAP status: Due, Education has been provided regarding the importance of this vaccine. Advised may receive this vaccine at local pharmacy or Health Dept. Aware to provide a copy of the vaccination record if obtained from local pharmacy or Health Dept. Verbalized  acceptance and understanding.  Flu Vaccine status: Due, Education has been provided regarding the importance of this vaccine. Advised may receive this vaccine at local pharmacy or Health Dept. Aware to provide a copy of the vaccination record if obtained from local pharmacy or Health Dept. Verbalized acceptance and understanding.  Pneumococcal vaccine status: Up to date  Covid-19 vaccine status: Completed vaccines  Qualifies for Shingles Vaccine? No   Zostavax completed No   Shingrix Completed?: Yes  Screening Tests Health Maintenance  Topic Date Due   COVID-19 Vaccine (4 - Pfizer  risk series) 02/01/2020   INFLUENZA VACCINE  09/10/2021   TETANUS/TDAP  08/23/2026 (Originally 09/06/1969)   Fecal DNA (Cologuard)  03/19/2024   Pneumonia Vaccine 69+ Years old  Completed   Hepatitis C Screening  Completed   Zoster Vaccines- Shingrix  Completed   HPV VACCINES  Aged Out   COLONOSCOPY (Pts 45-37yr Insurance coverage will need to be confirmed)  Discontinued    Health Maintenance  Health Maintenance Due  Topic Date Due   COVID-19 Vaccine (4 - Pfizer risk series) 02/01/2020   INFLUENZA VACCINE  09/10/2021    Colorectal cancer screening: Type of screening: Cologuard. Completed 03/19/2021. Repeat every 3 years  Lung Cancer Screening: (Low Dose CT Chest recommended if Age 70-80years, 30 pack-year currently smoking OR have quit w/in 15years.) does not qualify.   Lung Cancer Screening Referral: N/A  Additional Screening:  Hepatitis C Screening: does not qualify; Completed 07/29/2017  Vision Screening: Recommended annual ophthalmology exams for early detection of glaucoma and other disorders of the eye. Is the patient up to date with their annual eye exam?  No  Who is the provider or what is the name of the office in which the patient attends annual eye exams? [facility] is not established with a provider, would they like to be referred to a provider to establish care? No will schedule.   Dental Screening: Recommended annual dental exams for proper oral hygiene  Community Resource Referral / Chronic Care Management: CRR required this visit?  No   CCM required this visit?  No      Plan:     I have personally reviewed and noted the following in the patient's chart:   Medical and social history Use of alcohol, tobacco or illicit drugs  Current medications and supplements including opioid prescriptions. Patient is not currently taking opioid prescriptions. Functional ability and status Nutritional status Physical activity Advanced  directives List of other physicians Hospitalizations, surgeries, and ER visits in previous 12 months Vitals Screenings to include cognitive, depression, and falls Referrals and appointments  In addition, I have reviewed and discussed with patient certain preventive protocols, quality metrics, and best practice recommendations. A written personalized care plan for preventive services as well as general preventive health recommendations were provided to patient.     SRoyal Hawthorn [place]  10/17/2021   Nurse Notes: Non face to face minutes spent 30.  Vincent Guerra, Thank you for taking time to come for your Medicare Wellness Visit. I appreciate your ongoing commitment to your health goals. Please review the following plan we discussed and let me know if I can assist you in the future.   These are the goals we discussed:  Goals      Weight (lb) < 200 lb (90.7 kg)     patient wants to loose 10 pounds in 6 months.        This is a list of  the screening recommended for you and due dates:  Health Maintenance  Topic Date Due   COVID-19 Vaccine (4 - Pfizer risk series) 02/01/2020   Flu Shot  09/10/2021   Tetanus Vaccine  08/23/2026*   Cologuard (Stool DNA test)  03/19/2024   Pneumonia Vaccine  Completed   Hepatitis C Screening: USPSTF Recommendation to screen - Ages 47-79 yo.  Completed   Zoster (Shingles) Vaccine  Completed   HPV Vaccine  Aged Out   Colon Cancer Screening  Discontinued  *Topic was postponed. The date shown is not the original due date.

## 2021-10-21 DIAGNOSIS — D485 Neoplasm of uncertain behavior of skin: Secondary | ICD-10-CM | POA: Diagnosis not present

## 2021-10-21 DIAGNOSIS — D235 Other benign neoplasm of skin of trunk: Secondary | ICD-10-CM | POA: Diagnosis not present

## 2021-10-23 ENCOUNTER — Ambulatory Visit
Admission: RE | Admit: 2021-10-23 | Discharge: 2021-10-23 | Disposition: A | Discharge status: Home / Self Care | Payer: Medicare Other | Source: Ambulatory Visit | Attending: Physician Assistant | Admitting: Physician Assistant

## 2021-10-23 DIAGNOSIS — M48062 Spinal stenosis, lumbar region with neurogenic claudication: Secondary | ICD-10-CM | POA: Diagnosis not present

## 2021-10-23 DIAGNOSIS — M5137 Other intervertebral disc degeneration, lumbosacral region: Secondary | ICD-10-CM | POA: Diagnosis not present

## 2021-10-23 DIAGNOSIS — M4316 Spondylolisthesis, lumbar region: Secondary | ICD-10-CM | POA: Diagnosis not present

## 2021-11-05 ENCOUNTER — Ambulatory Visit (INDEPENDENT_AMBULATORY_CARE_PROVIDER_SITE_OTHER): Payer: Medicare Other | Admitting: Podiatry

## 2021-11-05 DIAGNOSIS — M7662 Achilles tendinitis, left leg: Secondary | ICD-10-CM | POA: Diagnosis not present

## 2021-11-05 MED ORDER — BETAMETHASONE SOD PHOS & ACET 6 (3-3) MG/ML IJ SUSP
3.0000 mg | Freq: Once | INTRAMUSCULAR | Status: AC
  Administered 2021-11-05: 3 mg via INTRA_ARTICULAR

## 2021-11-05 MED ORDER — MELOXICAM 15 MG PO TABS: 15.0000 mg | ORAL_TABLET | Freq: Every day | ORAL | 1 refills | Status: DC

## 2021-11-05 MED ORDER — BETAMETHASONE SOD PHOS & ACET 6 (3-3) MG/ML IJ SUSP: 3.0000 mg | Freq: Once | INTRAMUSCULAR | Status: DC

## 2021-11-05 MED ORDER — METHYLPREDNISOLONE 4 MG PO TBPK: ORAL_TABLET | ORAL | 0 refills | Status: DC

## 2021-11-05 NOTE — Progress Notes (Signed)
   Chief Complaint  Patient presents with   left foot pain    Patient is here for left foot achlles pain he states that the injection worked up till today.    HPI: 71 y.o. male presenting today for follow-up evaluation of posterior heel pain/Achilles tendinitis to the left lower extremity.  Last visit cortisone injection was administered and he did feel better for a few months.  Over the past week the pain is returned.  He does take Motrin 800 mg 2 times daily for general body pain.  Denies a history of injury.  Presents for further treatment and evaluation  Past Medical History:  Diagnosis Date   Allergy    Arthritis    Diverticulosis    GERD (gastroesophageal reflux disease)    Hyperlipidemia     Past Surgical History:  Procedure Laterality Date   BACK SURGERY  01/17/2015   Dr Harl [facility]), Multiple diskectomy and herniated disc   CHOLECYSTECTOMY  07/07/2019   TOTAL KNEE ARTHROPLASTY Left 01/17/2019   Procedure: TOTAL KNEE ARTHROPLASTY;  Surgeon: Vickey Huger, MD;  Location: [facility];  Service: Orthopedics;  Laterality: Left;  75 mins needed for length of case. same day discharge   VASECTOMY      Allergies  Allergen Reactions   Metronidazole Hives   Tetanus Toxoid Adsorbed Hives     Physical Exam: General: The patient is alert and oriented x3 in no acute distress.  Dermatology: Skin is warm, dry and supple bilateral lower extremities. Negative for open lesions or macerations.  Vascular: Palpable pedal pulses bilaterally. No edema or erythema noted. Capillary refill within normal limits.  Neurological: Epicritic and protective threshold grossly intact bilaterally.   Musculoskeletal Exam: Pain on palpation noted to the posterior tubercle of the left calcaneus at the insertion of the Achilles tendon consistent with retrocalcaneal bursitis. Range of motion within normal limits. Muscle strength 5/5 in all muscle groups bilateral lower extremities.  Radiographic Exam  LT foot 08/27/2021:  Posterior calcaneal spur noted to the respective calcaneus on lateral view with small intrasubstance calcification. No fracture or dislocation noted. Normal osseous mineralization noted.     Assessment: 1. Insertional Achilles tendinitis left 2.  Posterior heel spur left  Plan of Care:  1. Patient was evaluated.  2.  Injection of 0.5 cc Celestone Soluspan injected around the posterior tubercle of the calcaneus avoiding the Achilles tendon.  I did explain to the patient the increased risk of Achilles rupture with steroid injections.  Patient understands and would like to have the injection administered today.  Again, care taken to avoid injection directly into the Achilles 3.  Prescription for Medrol Dosepak 4.  Prescription for meloxicam 15 mg daily as needed 5.  Continue daily stretching exercises 6.  Return to clinic as needed   Edrick Kins, DPM [facility]  Dr. Edrick Kins, DPM    [address] [place], [place] [address]                Office [phone]  Fax [phone]

## 2021-11-19 ENCOUNTER — Other Ambulatory Visit: Payer: Self-pay | Admitting: Internal Medicine

## 2021-11-19 NOTE — Telephone Encounter (Signed)
Requested Prescriptions  Pending Prescriptions Disp Refills  . fluticasone (FLONASE) 50 MCG/ACT nasal spray [Pharmacy Med Name: FLUTICASONE PROPIONATE 50 MCG/ACT N] 16 g     Sig: SPRAY TWICE INTO EACH NOSTRIL EVERY MORNING     Ear, Nose, and Throat: Nasal Preparations - Corticosteroids Passed - 11/19/2021 10:31 AM      Passed - Valid encounter within last 12 months    Recent Outpatient Visits          2 months ago [facility] Teodora Medici, DO   8 months ago Pre-diabetes   [facility] Teodora Medici, DO   1 year ago Pre-diabetes   [facility] Myles Gip, DO   1 year ago Mixed hyperlipidemia   [place], [place]   2 years ago Mixed hyperlipidemia   [place], MD      Future Appointments            In 3 months Teodora Medici, [facility], [facility]

## 2021-11-20 DIAGNOSIS — L570 Actinic keratosis: Secondary | ICD-10-CM | POA: Diagnosis not present

## 2021-11-20 DIAGNOSIS — C44329 Squamous cell carcinoma of skin of other parts of face: Secondary | ICD-10-CM | POA: Diagnosis not present

## 2021-11-20 DIAGNOSIS — D485 Neoplasm of uncertain behavior of skin: Secondary | ICD-10-CM | POA: Diagnosis not present

## 2021-11-20 DIAGNOSIS — R208 Other disturbances of skin sensation: Secondary | ICD-10-CM | POA: Diagnosis not present

## 2021-11-26 DIAGNOSIS — C44329 Squamous cell carcinoma of skin of other parts of face: Secondary | ICD-10-CM | POA: Diagnosis not present

## 2021-11-26 DIAGNOSIS — D0439 Carcinoma in situ of skin of other parts of face: Secondary | ICD-10-CM | POA: Diagnosis not present

## 2021-12-23 ENCOUNTER — Telehealth: Payer: Self-pay | Admitting: Podiatry

## 2021-12-23 ENCOUNTER — Other Ambulatory Visit: Payer: Self-pay | Admitting: Podiatry

## 2021-12-23 MED ORDER — MELOXICAM 15 MG PO TABS: 15.0000 mg | ORAL_TABLET | Freq: Every day | ORAL | 1 refills | Status: DC

## 2021-12-23 NOTE — Telephone Encounter (Signed)
Sent. - Dr. Lisle Skillman

## 2021-12-23 NOTE — Telephone Encounter (Signed)
  Pt called requesting a refill for his RX and would like to know if he can get a refill of 90 instead on 30?  Please advise

## 2021-12-24 ENCOUNTER — Other Ambulatory Visit: Payer: Self-pay | Admitting: Internal Medicine

## 2021-12-24 DIAGNOSIS — E782 Mixed hyperlipidemia: Secondary | ICD-10-CM

## 2021-12-24 NOTE — Telephone Encounter (Signed)
Unable to refill per protocol, Rx request is too soon. Last refill 09/24/21 for 90 and 1 RF. Will refuse.  Requested Prescriptions  Pending Prescriptions Disp Refills   simvastatin (ZOCOR) 40 MG tablet [Pharmacy Med Name: SIMVASTATIN 40 MG TAB] 90 tablet 1    Sig: TAKE 1 TABLET BY MOUTH AT BEDTIME     Cardiovascular:  Antilipid - Statins Failed - 12/24/2021  2:03 PM      Failed - Lipid Panel in normal range within the last 12 months    Cholesterol  Date Value Ref Range Status  03/11/2021 174 <200 mg/dL Final  08/18/2011 183 0 - 200 mg/dL Final   Ldl Cholesterol, Calc  Date Value Ref Range Status  08/18/2011 113 (H) 0 - 100 mg/dL Final   LDL Cholesterol (Calc)  Date Value Ref Range Status  03/11/2021 103 (H) mg/dL (calc) Final    Comment:    Reference range: <100 . Desirable range <100 mg/dL for primary prevention;   <70 mg/dL for patients with CHD or diabetic patients  with > or = 2 CHD risk factors. Marland Kitchen LDL-C is now calculated using the Martin-Hopkins  calculation, which is a validated novel method providing  better accuracy than the Friedewald equation in the  estimation of LDL-C.  Cresenciano Genre et al. Annamaria Helling. 1694;503(88): 2061-2068  (http://education.[employer].com/faq/FAQ164)    HDL Cholesterol  Date Value Ref Range Status  08/18/2011 42 40 - 60 mg/dL Final   HDL  Date Value Ref Range Status  03/11/2021 52 > OR = 40 mg/dL Final   Triglycerides  Date Value Ref Range Status  03/11/2021 92 <150 mg/dL Final  08/18/2011 139 0 - 200 mg/dL Final         Passed - Patient is not pregnant      Passed - Valid encounter within last 12 months    Recent Outpatient Visits           3 months ago Pre-diabetes   [facility] Teodora Medici, DO   9 months ago [facility] Teodora Medici, DO   1 year ago Pre-diabetes   [facility] Myles Gip, DO   1 year ago Mixed hyperlipidemia    [facility] Towanda Malkin, MD   2 years ago Mixed hyperlipidemia   [facility] Towanda Malkin, MD       Future Appointments             In 2 months Teodora Medici, [facility], [facility]

## 2021-12-26 DIAGNOSIS — M19011 Primary osteoarthritis, right shoulder: Secondary | ICD-10-CM | POA: Diagnosis not present

## 2021-12-26 DIAGNOSIS — M19012 Primary osteoarthritis, left shoulder: Secondary | ICD-10-CM | POA: Diagnosis not present

## 2022-01-07 ENCOUNTER — Telehealth: Payer: Self-pay | Admitting: Internal Medicine

## 2022-01-07 DIAGNOSIS — N401 Enlarged prostate with lower urinary tract symptoms: Secondary | ICD-10-CM

## 2022-01-07 NOTE — Telephone Encounter (Signed)
Unable to refill per protocol, request is too soon. Last RF 04/10/21 for 90 and 3 RF. Will refuse.  Requested Prescriptions  Pending Prescriptions Disp Refills   finasteride (PROSCAR) 5 MG tablet [Pharmacy Med Name: FINASTERIDE 5 MG TAB] 90 tablet 3    Sig: TAKE 1 TABLET BY MOUTH DAILY     Urology: 5-alpha Reductase Inhibitors Passed - 01/07/2022  8:59 AM      Passed - PSA in normal range and within 360 days    PSA  Date Value Ref Range Status  09/09/2021 0.17 < OR = 4.00 ng/mL Final    Comment:    The total PSA value from this assay system is  standardized against the WHO standard. The test  result will be approximately 20% lower when compared  to the equimolar-standardized total PSA (Beckman  Coulter). Comparison of serial PSA results should be  interpreted with this fact in mind. . This test was performed using the Siemens  chemiluminescent method. Values obtained from  different assay methods cannot be used interchangeably. PSA levels, regardless of value, should not be interpreted as absolute evidence of the presence or absence of disease.          Passed - Valid encounter within last 12 months    Recent Outpatient Visits           4 months ago [facility] Teodora Medici, DO   10 months ago Pre-diabetes   [facility] Teodora Medici, DO   1 year ago Pre-diabetes   [facility] Myles Gip, DO   1 year ago Mixed hyperlipidemia   [place], MD   2 years ago Mixed hyperlipidemia   [facility] Towanda Malkin, MD       Future Appointments             In 2 months Teodora Medici, [facility], [facility]

## 2022-01-10 ENCOUNTER — Ambulatory Visit (INDEPENDENT_AMBULATORY_CARE_PROVIDER_SITE_OTHER): Payer: Medicare Other

## 2022-01-10 DIAGNOSIS — Z23 Encounter for immunization: Secondary | ICD-10-CM

## 2022-03-03 DIAGNOSIS — M5416 Radiculopathy, lumbar region: Secondary | ICD-10-CM | POA: Diagnosis not present

## 2022-03-05 ENCOUNTER — Other Ambulatory Visit: Payer: Self-pay | Admitting: Orthopedic Surgery

## 2022-03-05 DIAGNOSIS — M545 Low back pain, unspecified: Secondary | ICD-10-CM

## 2022-03-11 ENCOUNTER — Encounter: Payer: Self-pay | Admitting: Internal Medicine

## 2022-03-11 NOTE — Progress Notes (Unsigned)
Established Patient Office Visit  Subjective:  Patient ID: Vincent Guerra, male    DOB: [birth year]  Age: 72 y.o. MRN: [id number]  CC:  No chief complaint on file.   HPI Vincent Guerra presents for follow up on chronic medical conditions. Overall doing well.   Pre-Diabetes: -Last A1c 7/23 6.1% -Not currently on any medications  -States that diet has been worse lately  HLD: -Medications: Simvastatin 40 mg -Patient is compliant with above medications and reports no side effects.  -Last lipid panel: Lipid Panel     Component Value Date/Time   CHOL 174 03/11/2021 1121   CHOL 183 08/18/2011 1510   TRIG 92 03/11/2021 1121   TRIG 139 08/18/2011 1510   HDL 52 03/11/2021 1121   HDL 42 08/18/2011 1510   CHOLHDL 3.3 03/11/2021 1121   VLDL 28 08/18/2011 1510   LDLCALC 103 (H) 03/11/2021 1121   LDLCALC 113 (H) 08/18/2011 1510   The 10-year ASCVD risk score (Arnett DK, et al., 2019) is: 19.6%   Values used to calculate the score:     Age: 13 years     Sex: Male     Is Non-Hispanic African American: No     Diabetic: No     Tobacco smoker: No     Systolic Blood Pressure: 638 mmHg     Is BP treated: No     HDL Cholesterol: 52 mg/dL     Total Cholesterol: 174 mg/dL  BPH: -Currently on Finasteride 5 mg and Flomax 0.4 mg daily.  -Last PSA 7/23 0.17, does have a family history of prostate cancer in his paternal grandfather and father - both of which were in their 68's when they were diagnosed but father lived to age 71.  GERD: -Currently on Pepcid 20 mg BID, doing well   Arthritis: -Taking Ibuprofen daily, uses Voltaren as well  Allergic Rhinitis: -Currently on Claritin and Flonase but only once a day (causes nose bleeds if used more frequently), occasionally using Afrin at night -History of broken nose several times, deviated septum   Health Maintenance: -Blood work UTD -Colon cancer screening: Cologuard 2/23 negative   Past Medical History:  Diagnosis Date    Allergy    Arthritis    Diverticulosis    GERD (gastroesophageal reflux disease)    Hyperlipidemia     Past Surgical History:  Procedure Laterality Date   BACK SURGERY  01/17/2015   Dr Harl Bowie Rondall Allegra), Multiple diskectomy and herniated disc   CHOLECYSTECTOMY  07/07/2019   TOTAL KNEE ARTHROPLASTY Left 01/17/2019   Procedure: TOTAL KNEE ARTHROPLASTY;  Surgeon: Vickey Huger, MD;  Location: [facility];  Service: Orthopedics;  Laterality: Left;  75 mins needed for length of case. 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, Street)  Tobacco Use   Smoking status: Former  Packs/day: 1.00    Types: Cigarettes    Quit date: 72    Years since quitting: 30.0   Smokeless tobacco: Never  Vaping Use   Vaping Use: Never used  Substance and Sexual Activity   Alcohol use: Yes    Alcohol/week: 24.0 standard drinks of alcohol    Types: 24 Cans of beer per week   Drug use: No   Sexual activity: Not on file  Other Topics Concern   Not on file  Social History Narrative   Not on file   Social Determinants of Health   Financial Resource Strain: Low Risk  (10/16/2020)   Overall Financial Resource Strain (CARDIA)    Difficulty of Paying Living Expenses: Not hard at all  Food Insecurity: No Food Insecurity (10/16/2020)   Hunger Vital Sign    Worried About Running Out of Food in the Last Year: Never true    [place] in the Last Year: Never true  Transportation Needs: No Transportation  Needs (10/16/2020)   PRAPARE - Hydrologist (Medical): No    Lack of Transportation (Non-Medical): No  Physical Activity: Insufficiently Active (10/16/2020)   Exercise Vital Sign    Days of Exercise per Week: 7 days    Minutes of Exercise per Session: 20 min  Stress: No Stress Concern Present (10/16/2020)   [place]    Feeling of Stress : Not at all  Social Connections: Moderately Isolated (10/16/2020)   Social Connection and Isolation Panel [NHANES]    Frequency of Communication with Friends and Family: More than three times a week    Frequency of Social Gatherings with Friends and Family: Twice a week    Attends Religious Services: Never    Marine scientist or Organizations: No    Attends Archivist Meetings: Never    Marital Status: Married  Human resources officer Violence: Not At Risk (10/16/2020)   Humiliation, Afraid, Rape, and Kick questionnaire    Fear of Current or Ex-Partner: No    Emotionally Abused: No    Physically Abused: No    Sexually Abused: No    Outpatient Medications Prior to Visit  Medication Sig Dispense Refill   aspirin 81 MG chewable tablet Chew by mouth daily.     diclofenac sodium (VOLTAREN) 1 % GEL Apply 2 g topically 3 (three) times daily as needed. Use for hands and knee arthritis, joint pain (Patient taking differently: Apply 2 g topically 3 (three) times daily as needed (shoulder pain/hands & knee pain.).) 100 g 2   famotidine (PEPCID) 20 MG tablet TAKE 1 TABLET BY MOUTH 2 TIMES DAILY AS NEEDED FOR HEARTBURN OR INDIGESTION 180 tablet 1   finasteride (PROSCAR) 5 MG tablet TAKE 1 TABLET BY MOUTH DAILY 90 tablet 3   fluticasone (FLONASE) 50 MCG/ACT nasal spray SPRAY TWICE INTO EACH NOSTRIL EVERY MORNING 16 g 2   IBU 800 MG tablet TAKE 1 TABLET BY MOUTH EVERY 8 HOURS AS NEEDED.**TAKE WITH FOOD** 270 tablet 1   loratadine (CLARITIN) 10 MG tablet Take 10 mg by  mouth 2 (two) times daily.      Magnesium 500 MG TABS Take 500 mg by mouth at bedtime.     meloxicam (MOBIC) 15 MG tablet Take 1 tablet (15 mg total) by mouth daily. 90 tablet 1   methylPREDNISolone (MEDROL DOSEPAK) 4 MG TBPK tablet 6 day dose pack - take as directed 21 tablet 0  methylPREDNISolone (MEDROL DOSEPAK) 4 MG TBPK tablet 6 day dose pack - take as directed 21 tablet 0   Multiple Vitamin (MULTIVITAMIN WITH MINERALS) TABS tablet Take 1 tablet by mouth at bedtime.     Omega-3 Fatty Acids (FISH OIL) 1200 MG CAPS Take 1,200 mg by mouth 2 (two) times daily.      Potassium 99 MG TABS Take 1 tablet by mouth in the morning.     simvastatin (ZOCOR) 40 MG tablet TAKE 1 TABLET BY MOUTH AT BEDTIME 90 tablet 1   tamsulosin (FLOMAX) 0.4 MG CAPS capsule Take 1 capsule (0.4 mg total) by mouth daily. 90 capsule 2   Turmeric 500 MG CAPS Take 500 mg by mouth every evening.     No facility-administered medications prior to visit.    Allergies  Allergen Reactions   Metronidazole Hives   Tetanus Toxoid Adsorbed Hives    ROS Review of Systems  Constitutional:  Negative for chills and fever.  Eyes:  Negative for visual disturbance.  Respiratory:  Negative for cough.   Cardiovascular:  Negative for chest pain.  Musculoskeletal:  Positive for arthralgias.      Objective:    Physical Exam Constitutional:      Appearance: Normal appearance.  HENT:     Head: Normocephalic and atraumatic.  Eyes:     Conjunctiva/sclera: Conjunctivae normal.  Cardiovascular:     Rate and Rhythm: Normal rate and regular rhythm.  Pulmonary:     Effort: Pulmonary effort is normal.     Breath sounds: Normal breath sounds.  Musculoskeletal:     Right lower leg: No edema.     Left lower leg: No edema.  Skin:    General: Skin is warm and dry.  Neurological:     General: No focal deficit present.     Mental Status: He is alert. Mental status is at baseline.  Psychiatric:        Mood and Affect: Mood normal.         Behavior: Behavior normal.     There were no vitals taken for this visit. Wt Readings from Last 3 Encounters:  10/17/21 250 lb (113.4 kg)  09/09/21 250 lb 8 oz (113.6 kg)  03/11/21 245 lb 4.8 oz (111.3 kg)     Health Maintenance Due  Topic Date Due   DTaP/Tdap/Td (1 - Tdap) Never done   COVID-19 Vaccine (4 - 2023-24 season) 10/11/2021    There are no preventive care reminders to display for this patient.  Lab Results  Component Value Date   TSH 2.21 11/11/2016   Lab Results  Component Value Date   WBC 8.5 09/09/2021   HGB 15.1 09/09/2021   HCT 44.8 09/09/2021   MCV 91.6 09/09/2021   PLT 176 09/09/2021   Lab Results  Component Value Date   NA 139 09/09/2021   K 4.6 09/09/2021   CO2 24 09/09/2021   GLUCOSE 113 (H) 09/09/2021   BUN 15 09/09/2021   CREATININE 0.93 09/09/2021   BILITOT 0.5 09/09/2021   ALKPHOS 49 07/04/2019   AST 22 09/09/2021   ALT 43 09/09/2021   PROT 6.5 09/09/2021   ALBUMIN 4.3 07/04/2019   CALCIUM 9.1 09/09/2021   ANIONGAP 8 07/04/2019   EGFR 88 09/09/2021   Lab Results  Component Value Date   CHOL 174 03/11/2021   Lab Results  Component Value Date   HDL 52 03/11/2021   Lab Results  Component Value Date   LDLCALC 103 (H) 03/11/2021  Lab Results  Component Value Date   TRIG 92 03/11/2021   Lab Results  Component Value Date   CHOLHDL 3.3 03/11/2021   Lab Results  Component Value Date   HGBA1C 6.1 (H) 09/09/2021      Assessment & Plan:   1. Pre-diabetes: Check A1c, CBC, CMP today. Had lost weight in the past and A1c was good but starting to increase in January. Patient states diet has not been good lately. Discussed starting Metformin to prevent diabetes, patient would like to discuss after new A1c results.   - CBC w/Diff/Platelet - COMPLETE METABOLIC PANEL WITH GFR - HgB A1c  2. Mixed hyperlipidemia: Reviewed results from last time, LDL 103, ASCVD risk 14.6%. Continue Zocor 40 mg and diet changes. Given list of  foods and cholesterol content. Consider switching to Lipitor next time.   3. Prostate cancer screening/Family history of prostate cancer: Check PSA today.  - PSA  4. BPH with obstruction/lower urinary tract symptoms: Continue Flomax 0.4 mg daily, refilled today. Continue Finasteride 5 mg as well.   - tamsulosin (FLOMAX) 0.4 MG CAPS capsule; Take 1 capsule (0.4 mg total) by mouth daily.  Dispense: 90 capsule; Refill: 2  5. Non-seasonal allergic rhinitis, unspecified trigger: Stable, continue Claritin 10 mg and Flonase for symptoms. Discussed Afrin use - ok to use occasionally.   6. Gastroesophageal reflux disease without esophagitis: Stable, continue Pepcid 20 mg BID.  7. Primary osteoarthritis involving multiple joints: Pain at multiple sites, worse in right shoulder. Using Voltaren and taking anti-inflammatories.   Follow-up: No follow-ups on file.    Teodora Medici, DO

## 2022-03-12 ENCOUNTER — Encounter: Payer: Self-pay | Admitting: Internal Medicine

## 2022-03-12 ENCOUNTER — Ambulatory Visit (INDEPENDENT_AMBULATORY_CARE_PROVIDER_SITE_OTHER): Payer: Medicare Other | Admitting: Internal Medicine

## 2022-03-12 VITALS — BP 122/80 | HR 101 | Temp 98.8°F | Resp 18 | Ht 70.0 in | Wt 245.4 lb

## 2022-03-12 DIAGNOSIS — R7303 Prediabetes: Secondary | ICD-10-CM | POA: Diagnosis not present

## 2022-03-12 DIAGNOSIS — E782 Mixed hyperlipidemia: Secondary | ICD-10-CM | POA: Diagnosis not present

## 2022-03-12 DIAGNOSIS — E1165 Type 2 diabetes mellitus with hyperglycemia: Secondary | ICD-10-CM

## 2022-03-12 DIAGNOSIS — Z23 Encounter for immunization: Secondary | ICD-10-CM

## 2022-03-12 LAB — POCT GLYCOSYLATED HEMOGLOBIN (HGB A1C): Hemoglobin A1C: 6.8 % — AB (ref 4.0–5.6)

## 2022-03-12 MED ORDER — METFORMIN HCL 500 MG PO TABS: 500.0000 mg | ORAL_TABLET | Freq: Every day | ORAL | 1 refills | Status: DC

## 2022-03-12 MED ORDER — SIMVASTATIN 40 MG PO TABS: 40.0000 mg | ORAL_TABLET | Freq: Every day | ORAL | 1 refills | Status: DC

## 2022-03-12 NOTE — Patient Instructions (Addendum)
It was great seeing you today!  Plan discussed at today's visit: -A1c 6.8%, making you a new diabetic -Start Metformin 500 mg once daily with breakfast - monitor for GI symptoms and let me know if you cannot tolerate the medication -Pneumonia vaccine today -Plan for fasting labs next time  Follow up in: 6 months   Take care and let us know if you have any questions or concerns prior to your next visit.  Dr. Rosana Berger

## 2022-03-17 ENCOUNTER — Ambulatory Visit
Admission: RE | Admit: 2022-03-17 | Discharge: 2022-03-17 | Disposition: A | Discharge status: Home / Self Care | Payer: Medicare Other | Source: Ambulatory Visit | Attending: Orthopedic Surgery | Admitting: Orthopedic Surgery

## 2022-03-17 DIAGNOSIS — G8929 Other chronic pain: Secondary | ICD-10-CM

## 2022-03-17 DIAGNOSIS — M4726 Other spondylosis with radiculopathy, lumbar region: Secondary | ICD-10-CM | POA: Diagnosis not present

## 2022-03-17 MED ORDER — IOPAMIDOL (ISOVUE-M 200) INJECTION 41%
1.0000 mL | Freq: Once | INTRAMUSCULAR | Status: AC
  Administered 2022-03-17: 1 mL via EPIDURAL

## 2022-03-17 MED ORDER — METHYLPREDNISOLONE ACETATE 40 MG/ML INJ SUSP (RADIOLOG
80.0000 mg | Freq: Once | INTRAMUSCULAR | Status: AC
  Administered 2022-03-17: 80 mg via EPIDURAL

## 2022-03-17 NOTE — Discharge Instructions (Signed)
Post Procedure Spinal Discharge Instruction Sheet  You may resume a regular diet and any medications that you routinely take (including pain medications) unless otherwise noted by MD.  No driving day of procedure.  Light activity throughout the rest of the day.  Do not do any strenuous work, exercise, bending or lifting.  The day following the procedure, you can resume normal physical activity but you should refrain from exercising or physical therapy for at least three days thereafter.  You may apply ice to the injection site, 20 minutes on, 20 minutes off, as needed. Do not apply ice directly to skin.    Common Side Effects:  Headaches- take your usual medications as directed by your physician.  Increase your fluid intake.  Caffeinated beverages may be helpful.  Lie flat in bed until your headache resolves.  Restlessness or inability to sleep- you may have trouble sleeping for the next few days.  Ask your referring physician if you need any medication for sleep.  Facial flushing or redness- should subside within a few days.  Increased pain- a temporary increase in pain a day or two following your procedure is not unusual.  Take your pain medication as prescribed by your referring physician.  Leg cramps  Please contact our office at [phone] for the following symptoms: Fever greater than 100 degrees. Headaches unresolved with medication after 2-3 days. Increased swelling, pain, or redness at injection site.   Thank you for visiting [facility] today.   May resume Aspirin immediately!

## 2022-03-25 DIAGNOSIS — M5416 Radiculopathy, lumbar region: Secondary | ICD-10-CM | POA: Diagnosis not present

## 2022-04-01 ENCOUNTER — Other Ambulatory Visit: Payer: Self-pay | Admitting: Podiatry

## 2022-04-07 DIAGNOSIS — D2272 Melanocytic nevi of left lower limb, including hip: Secondary | ICD-10-CM | POA: Diagnosis not present

## 2022-04-07 DIAGNOSIS — D2261 Melanocytic nevi of right upper limb, including shoulder: Secondary | ICD-10-CM | POA: Diagnosis not present

## 2022-04-07 DIAGNOSIS — D044 Carcinoma in situ of skin of scalp and neck: Secondary | ICD-10-CM | POA: Diagnosis not present

## 2022-04-07 DIAGNOSIS — C44622 Squamous cell carcinoma of skin of right upper limb, including shoulder: Secondary | ICD-10-CM | POA: Diagnosis not present

## 2022-04-07 DIAGNOSIS — D2262 Melanocytic nevi of left upper limb, including shoulder: Secondary | ICD-10-CM | POA: Diagnosis not present

## 2022-04-07 DIAGNOSIS — D485 Neoplasm of uncertain behavior of skin: Secondary | ICD-10-CM | POA: Diagnosis not present

## 2022-04-07 DIAGNOSIS — D0359 Melanoma in situ of other part of trunk: Secondary | ICD-10-CM | POA: Diagnosis not present

## 2022-04-07 DIAGNOSIS — Z85828 Personal history of other malignant neoplasm of skin: Secondary | ICD-10-CM | POA: Diagnosis not present

## 2022-04-07 DIAGNOSIS — L570 Actinic keratosis: Secondary | ICD-10-CM | POA: Diagnosis not present

## 2022-04-07 DIAGNOSIS — X32XXXA Exposure to sunlight, initial encounter: Secondary | ICD-10-CM | POA: Diagnosis not present

## 2022-04-07 DIAGNOSIS — D225 Melanocytic nevi of trunk: Secondary | ICD-10-CM | POA: Diagnosis not present

## 2022-04-08 ENCOUNTER — Other Ambulatory Visit: Payer: Self-pay | Admitting: Internal Medicine

## 2022-04-08 ENCOUNTER — Ambulatory Visit: Payer: Self-pay | Admitting: *Deleted

## 2022-04-08 DIAGNOSIS — E1165 Type 2 diabetes mellitus with hyperglycemia: Secondary | ICD-10-CM

## 2022-04-08 MED ORDER — METFORMIN HCL ER 500 MG PO TB24: 500.0000 mg | ORAL_TABLET | Freq: Every day | ORAL | 3 refills | Status: DC

## 2022-04-08 NOTE — Telephone Encounter (Signed)
Medication Reaction   Per agent: "Patient states that he has been taking metformin for a week and he has diarrhea and leg cramps every day. Patient would like to be changed to something else"      Chief Complaint: Metformin Symptoms: None presently Frequency: NA Pertinent Negatives: Patient denies any symptoms.  Disposition: '[]'$ ED /'[]'$ Urgent Care (no appt availability in office) / '[]'$ Appointment(In office/virtual)/ '[]'$  [facility]/ '[]'$ Home Care/ '[]'$ Refused Recommended Disposition /'[]'$ [place] [facility]/ '[x]'$  Follow-up with PCP Additional Notes: Pt reports started Metformin 2/1 or 03/14/22. States after 3rd day developed diarrhea, leg cramps, headache. Stopped taking med"Over a week ago", symptoms resolved. Pt requesting alternate  medication. Please advise.  CB# [phone] Reason for Disposition  Caller wants to use a complementary or alternative medicine  Answer Assessment - Initial Assessment Questions 1. NAME of MEDICINE: "What medicine(s) are you calling about?"     Metformin 2. QUESTION: "What is your question?" (e.g., double dose of medicine, side effect)     Alternate med 3. PRESCRIBER: "Who prescribed the medicine?" Reason: if prescribed by specialist, call should be referred to that group.     PCP 4. SYMPTOMS: "Do you have any symptoms?" If Yes, ask: "What symptoms are you having?"  "How bad are the symptoms (e.g., mild, moderate, severe)     Revolved now  Protocols used: Medication Question Call-A-AH

## 2022-04-09 NOTE — Telephone Encounter (Signed)
notified

## 2022-04-15 ENCOUNTER — Other Ambulatory Visit: Payer: Self-pay | Admitting: Internal Medicine

## 2022-04-15 ENCOUNTER — Other Ambulatory Visit: Payer: Self-pay

## 2022-04-15 DIAGNOSIS — N138 Other obstructive and reflux uropathy: Secondary | ICD-10-CM

## 2022-04-15 DIAGNOSIS — E1165 Type 2 diabetes mellitus with hyperglycemia: Secondary | ICD-10-CM

## 2022-04-15 MED ORDER — METFORMIN HCL ER 500 MG PO TB24: 500.0000 mg | ORAL_TABLET | Freq: Every day | ORAL | 3 refills | Status: DC

## 2022-04-15 MED ORDER — FINASTERIDE 5 MG PO TABS: 5.0000 mg | ORAL_TABLET | Freq: Every day | ORAL | 3 refills | Status: DC

## 2022-04-15 NOTE — Telephone Encounter (Signed)
Medication Refill - Medication: finasteride (PROSCAR) 5 MG tablet [id number]   Has the patient contacted their pharmacy? Yes.    (Agent: If yes, when and what did the pharmacy advise?) Contact Provider   Preferred Pharmacy (with phone number or street name): [place], [place] - [place]   Has the patient been seen for an appointment in the last year OR does the patient have an upcoming appointment? Yes.    Agent: Please be advised that RX refills may take up to 3 business days. We ask that you follow-up with your pharmacy.   Patient is completely out of medication.

## 2022-04-15 NOTE — Telephone Encounter (Signed)
Already sent in by provider on 04/15/22.

## 2022-04-17 DIAGNOSIS — D0359 Melanoma in situ of other part of trunk: Secondary | ICD-10-CM | POA: Diagnosis not present

## 2022-04-18 ENCOUNTER — Encounter: Payer: Self-pay | Admitting: Internal Medicine

## 2022-04-24 ENCOUNTER — Other Ambulatory Visit: Payer: Self-pay | Admitting: Internal Medicine

## 2022-04-25 NOTE — Telephone Encounter (Signed)
Future visit in 4 months . Requested Prescriptions  Pending Prescriptions Disp Refills   fluticasone (FLONASE) 50 MCG/ACT nasal spray [Pharmacy Med Name: FLUTICASONE PROPIONATE 50 MCG/ACT N] 16 g 1    Sig: SPRAY TWICE INTO EACH NOSTRIL EVERY MORNING     Ear, Nose, and Throat: Nasal Preparations - Corticosteroids Passed - 04/24/2022  6:10 PM      Passed - Valid encounter within last 12 months    Recent Outpatient Visits           1 month ago Type 2 diabetes mellitus with hyperglycemia, without long-term current use of insulin [facility])   [facility] Teodora Medici, DO   7 months ago [facility] Teodora Medici, DO   1 year ago [facility] Teodora Medici, DO   1 year ago [facility] Myles Gip, DO   2 years ago Mixed hyperlipidemia   [facility] Towanda Malkin, MD       Future Appointments             In 4 months Teodora Medici, [facility], [facility]

## 2022-05-01 DIAGNOSIS — D044 Carcinoma in situ of skin of scalp and neck: Secondary | ICD-10-CM | POA: Diagnosis not present

## 2022-05-26 DIAGNOSIS — M25512 Pain in left shoulder: Secondary | ICD-10-CM | POA: Diagnosis not present

## 2022-05-26 DIAGNOSIS — G8929 Other chronic pain: Secondary | ICD-10-CM | POA: Diagnosis not present

## 2022-05-26 DIAGNOSIS — M25511 Pain in right shoulder: Secondary | ICD-10-CM | POA: Diagnosis not present

## 2022-06-03 ENCOUNTER — Other Ambulatory Visit: Payer: Self-pay | Admitting: Internal Medicine

## 2022-06-03 DIAGNOSIS — N138 Other obstructive and reflux uropathy: Secondary | ICD-10-CM

## 2022-06-03 NOTE — Telephone Encounter (Signed)
Requested Prescriptions  Pending Prescriptions Disp Refills   tamsulosin (FLOMAX) 0.4 MG CAPS capsule [Pharmacy Med Name: TAMSULOSIN HCL 0.4 MG CAP] 90 capsule 1    Sig: TAKE 1 CAPSULE BY MOUTH ONCE DAILY     Urology: Alpha-Adrenergic Blocker Failed - 06/03/2022 10:48 AM      Failed - Last BP in normal range    BP Readings from Last 1 Encounters:  03/17/22 (!) 162/100         Passed - PSA in normal range and within 360 days    PSA  Date Value Ref Range Status  09/09/2021 0.17 < OR = 4.00 ng/mL Final    Comment:    The total PSA value from this assay system is  standardized against the WHO standard. The test  result will be approximately 20% lower when compared  to the equimolar-standardized total PSA (Beckman  Coulter). Comparison of serial PSA results should be  interpreted with this fact in mind. . This test was performed using the Siemens  chemiluminescent method. Values obtained from  different assay methods cannot be used interchangeably. PSA levels, regardless of value, should not be interpreted as absolute evidence of the presence or absence of disease.          Passed - Valid encounter within last 12 months    Recent Outpatient Visits           2 months ago Type 2 diabetes mellitus with hyperglycemia, without long-term current use of insulin [facility])   [facility] Margarita Mail, DO   8 months ago Pre-diabetes   [facility] Margarita Mail, DO   1 year ago Pre-diabetes   [facility] Margarita Mail, DO   1 year ago Pre-diabetes   [facility] Caro Laroche, DO   2 years ago Mixed hyperlipidemia   [facility] Jamelle Haring, MD       Future Appointments             In 3 months Margarita Mail, DO [facility], [facility]

## 2022-06-09 DIAGNOSIS — D2361 Other benign neoplasm of skin of right upper limb, including shoulder: Secondary | ICD-10-CM | POA: Diagnosis not present

## 2022-06-09 DIAGNOSIS — C44622 Squamous cell carcinoma of skin of right upper limb, including shoulder: Secondary | ICD-10-CM | POA: Diagnosis not present

## 2022-06-25 ENCOUNTER — Other Ambulatory Visit: Payer: Self-pay | Admitting: Internal Medicine

## 2022-06-25 DIAGNOSIS — E782 Mixed hyperlipidemia: Secondary | ICD-10-CM

## 2022-06-25 NOTE — Telephone Encounter (Signed)
Unable to refill per protocol, Rx request is too soon. Last refill 03/12/22 for 90 and 1 refill.  Requested Prescriptions  Pending Prescriptions Disp Refills   simvastatin (ZOCOR) 40 MG tablet [Pharmacy Med Name: SIMVASTATIN 40 MG TAB] 90 tablet 1    Sig: TAKE ONE TABLET BY MOUTH AT BEDTIME     Cardiovascular:  Antilipid - Statins Failed - 06/25/2022 11:18 AM      Failed - Lipid Panel in normal range within the last 12 months    Cholesterol  Date Value Ref Range Status  03/11/2021 174 <200 mg/dL Final  16/11/9602 540 0 - 200 mg/dL Final   Ldl Cholesterol, Calc  Date Value Ref Range Status  08/18/2011 113 (H) 0 - 100 mg/dL Final   LDL Cholesterol (Calc)  Date Value Ref Range Status  03/11/2021 103 (H) mg/dL (calc) Final    Comment:    Reference range: <100 . Desirable range <100 mg/dL for primary prevention;   <70 mg/dL for patients with CHD or diabetic patients  with > or = 2 CHD risk factors. Marland Kitchen LDL-C is now calculated using the Martin-Hopkins  calculation, which is a validated novel method providing  better accuracy than the Friedewald equation in the  estimation of LDL-C.  Horald Pollen et al. Lenox Ahr. 9811;914(78): 2061-2068  (http://education.[employer].com/faq/FAQ164)    HDL Cholesterol  Date Value Ref Range Status  08/18/2011 42 40 - 60 mg/dL Final   HDL  Date Value Ref Range Status  03/11/2021 52 > OR = 40 mg/dL Final   Triglycerides  Date Value Ref Range Status  03/11/2021 92 <150 mg/dL Final  29/56/2130 865 0 - 200 mg/dL Final         Passed - Patient is not pregnant      Passed - Valid encounter within last 12 months    Recent Outpatient Visits           3 months ago Type 2 diabetes mellitus with hyperglycemia, without long-term current use of insulin [facility])   [facility] Margarita Mail, DO   9 months ago Pre-diabetes   [facility] Margarita Mail, DO   1 year ago Pre-diabetes   [facility] Margarita Mail, DO   1 year ago Pre-diabetes   [facility] Caro Laroche, DO   2 years ago Mixed hyperlipidemia   [facility] Jamelle Haring, MD       Future Appointments             In 2 months Margarita Mail, DO [facility], [facility]

## 2022-07-01 ENCOUNTER — Other Ambulatory Visit: Payer: Self-pay | Admitting: Podiatry

## 2022-07-08 ENCOUNTER — Ambulatory Visit (INDEPENDENT_AMBULATORY_CARE_PROVIDER_SITE_OTHER): Payer: Medicare Other | Admitting: Podiatry

## 2022-07-08 ENCOUNTER — Other Ambulatory Visit: Payer: Self-pay | Admitting: Internal Medicine

## 2022-07-08 DIAGNOSIS — K219 Gastro-esophageal reflux disease without esophagitis: Secondary | ICD-10-CM

## 2022-07-08 DIAGNOSIS — M7662 Achilles tendinitis, left leg: Secondary | ICD-10-CM

## 2022-07-08 MED ORDER — MELOXICAM 15 MG PO TABS: 15.0000 mg | ORAL_TABLET | Freq: Every day | ORAL | 0 refills | Status: DC

## 2022-07-08 NOTE — Progress Notes (Signed)
   Chief Complaint  Patient presents with   Foot Pain    Patient came in today for left heel pain, patient denies any pain, in the heel, patient would like a refill on mobic for the left arm pain    HPI: 72 y.o. male presenting today for follow-up evaluation of bilateral lower extremity pain.  Patient has a history of Achilles tendinitis left lower extremity.  Currently he says he is doing very well.  He says the meloxicam helped tremendously with his left lower extremity as well as shoulder pain.  Requesting a refill today.  No new complaints  Past Medical History:  Diagnosis Date   Allergy    Arthritis    Diverticulosis    GERD (gastroesophageal reflux disease)    Hyperlipidemia     Past Surgical History:  Procedure Laterality Date   BACK SURGERY  01/17/2015   Dr Wyline Mood [facility]), Multiple diskectomy and herniated disc   CHOLECYSTECTOMY  07/07/2019   TOTAL KNEE ARTHROPLASTY Left 01/17/2019   Procedure: TOTAL KNEE ARTHROPLASTY;  Surgeon: Dannielle Huh, MD;  Location: [facility];  Service: Orthopedics;  Laterality: Left;  75 mins needed for length of case. same day discharge   VASECTOMY      Allergies  Allergen Reactions   Metronidazole Hives   Tetanus Toxoid Adsorbed Hives     Physical Exam: General: The patient is alert and oriented x3 in no acute distress.  Dermatology: Skin is warm, dry and supple bilateral lower extremities.   Vascular: Palpable pedal pulses bilaterally. Capillary refill within normal limits.  No appreciable edema.  No erythema.  Neurological: Grossly intact via light touch  Musculoskeletal Exam: No pedal deformities noted.  No pain or tenderness with palpation to the posterior tubercle of the calcaneus  Radiographic Exam LT foot 08/27/2021:  Posterior calcaneal spur noted to the respective calcaneus on lateral view with small intrasubstance calcification. No fracture or dislocation noted. Normal osseous mineralization noted.     Assessment/Plan of  Care: 1.  History of Achilles tendinitis left lower extremity  -Patient evaluated. -Refill prescription for meloxicam 15 mg daily #90 -Patient understands that long-term meloxicam for generalized arthritis should come from his PCP.  Recommend follow-up with PCP -Continue conservative management of his left lower extremity pain including good supportive shoes and sneakers.  Advised against going barefoot -Return to clinic as needed       Felecia Shelling, DPM [facility]  Dr. Felecia Shelling, DPM    [address] [place], [place] [address]                Office [phone]  Fax [phone]

## 2022-07-09 NOTE — Telephone Encounter (Signed)
Requested Prescriptions  Pending Prescriptions Disp Refills   famotidine (PEPCID) 20 MG tablet [Pharmacy Med Name: FAMOTIDINE 20 MG TAB] 180 tablet 1    Sig: TAKE 1 TABLET BY MOUTH 2 TIMES DAILY AS NEEDED FOR HEARTBURN OR INDIGESTION     Gastroenterology:  H2 Antagonists Passed - 07/08/2022  9:06 AM      Passed - Valid encounter within last 12 months    Recent Outpatient Visits           3 months ago Type 2 diabetes mellitus with hyperglycemia, without long-term current use of insulin [facility])   [facility] Margarita Mail, DO   10 months ago Pre-diabetes   [facility] Margarita Mail, DO   1 year ago Pre-diabetes   [facility] Margarita Mail, DO   1 year ago Pre-diabetes   [facility] Caro Laroche, DO   2 years ago Mixed hyperlipidemia   [facility] Jamelle Haring, MD       Future Appointments             In 2 months Margarita Mail, DO [facility], [facility]

## 2022-07-23 DIAGNOSIS — H02881 Meibomian gland dysfunction right upper eyelid: Secondary | ICD-10-CM | POA: Diagnosis not present

## 2022-07-23 DIAGNOSIS — H2513 Age-related nuclear cataract, bilateral: Secondary | ICD-10-CM | POA: Diagnosis not present

## 2022-07-23 DIAGNOSIS — H02884 Meibomian gland dysfunction left upper eyelid: Secondary | ICD-10-CM | POA: Diagnosis not present

## 2022-08-04 ENCOUNTER — Other Ambulatory Visit: Payer: Self-pay | Admitting: Internal Medicine

## 2022-08-04 DIAGNOSIS — E1165 Type 2 diabetes mellitus with hyperglycemia: Secondary | ICD-10-CM

## 2022-08-05 NOTE — Telephone Encounter (Signed)
Requested Prescriptions  Pending Prescriptions Disp Refills   metFORMIN (GLUCOPHAGE-XR) 500 MG 24 hr tablet [Pharmacy Med Name: METFORMIN HCL ER 500 MG TAB] 30 tablet 3    Sig: TAKE 1 TABLET BY MOUTH DAILY WITH BREAKFAST.     Endocrinology:  Diabetes - Biguanides Failed - 08/04/2022  2:31 PM      Failed - B12 Level in normal range and within 720 days    No results found for: "VITAMINB12"       Passed - Cr in normal range and within 360 days    Creat  Date Value Ref Range Status  09/09/2021 0.93 0.70 - 1.28 mg/dL Final         Passed - HBA1C is between 0 and 7.9 and within 180 days    Hemoglobin A1C  Date Value Ref Range Status  03/12/2022 6.8 (A) 4.0 - 5.6 % Final   Hgb A1c MFr Bld  Date Value Ref Range Status  09/09/2021 6.1 (H) <5.7 % of total Hgb Final    Comment:    For someone without known diabetes, a hemoglobin  A1c value between 5.7% and 6.4% is consistent with prediabetes and should be confirmed with a  follow-up test. . For someone with known diabetes, a value <7% indicates that their diabetes is well controlled. A1c targets should be individualized based on duration of diabetes, age, comorbid conditions, and other considerations. . This assay result is consistent with an increased risk of diabetes. . Currently, no consensus exists regarding use of hemoglobin A1c for diagnosis of diabetes for children. .          Passed - eGFR in normal range and within 360 days    GFR, Est African American  Date Value Ref Range Status  09/01/2019 104 > OR = 60 mL/min/1.39m2 Final   GFR, Est Non African American  Date Value Ref Range Status  09/01/2019 89 > OR = 60 mL/min/1.66m2 Final   eGFR  Date Value Ref Range Status  09/09/2021 88 > OR = 60 mL/min/1.75m2 Final         Passed - Valid encounter within last 6 months    Recent Outpatient Visits           4 months ago Type 2 diabetes mellitus with hyperglycemia, without long-term current use of insulin [facility])    [facility] Margarita Mail, DO   11 months ago Pre-diabetes   [facility] Margarita Mail, DO   1 year ago Pre-diabetes   [facility] Margarita Mail, DO   1 year ago Pre-diabetes   [facility] Caro Laroche, DO   2 years ago Mixed hyperlipidemia   [facility] Jamelle Haring, MD       Future Appointments             In 1 month Margarita Mail, DO [place] [facility]            Passed - CBC within normal limits and completed in the last 12 months    WBC  Date Value Ref Range Status  09/09/2021 8.5 3.8 - 10.8 Thousand/uL Final   RBC  Date Value Ref Range Status  09/09/2021 4.89 4.20 - 5.80 Million/uL Final   Hemoglobin  Date Value Ref Range Status  09/09/2021 15.1 13.2 - 17.1 g/dL Final   HGB  Date Value Ref Range Status  08/18/2011 14.8 13.0 - 18.0  g/dL Final   HCT  Date Value Ref Range Status  09/09/2021 44.8 38.5 - 50.0 % Final  08/18/2011 43.7 40.0 - 52.0 % Final   MCHC  Date Value Ref Range Status  09/09/2021 33.7 32.0 - 36.0 g/dL Final   [facility]  Date Value Ref Range Status  09/09/2021 30.9 27.0 - 33.0 pg Final   MCV  Date Value Ref Range Status  09/09/2021 91.6 80.0 - 100.0 fL Final  08/18/2011 92 80 - 100 fL Final   No results found for: "PLTCOUNTKUC", "LABPLAT", "POCPLA" RDW  Date Value Ref Range Status  09/09/2021 13.6 11.0 - 15.0 % Final  08/18/2011 13.4 11.5 - 14.5 % Final

## 2022-08-13 ENCOUNTER — Other Ambulatory Visit: Payer: Self-pay | Admitting: Internal Medicine

## 2022-08-13 NOTE — Telephone Encounter (Signed)
Requested Prescriptions  Pending Prescriptions Disp Refills   fluticasone (FLONASE) 50 MCG/ACT nasal spray [Pharmacy Med Name: FLUTICASONE PROPIONATE 50 MCG/ACT N] 16 g 0    Sig: SPRAY TWICE INTO EACH NOSTRIL EVERY MORNING     Ear, Nose, and Throat: Nasal Preparations - Corticosteroids Passed - 08/13/2022 10:14 AM      Passed - Valid encounter within last 12 months    Recent Outpatient Visits           5 months ago Type 2 diabetes mellitus with hyperglycemia, without long-term current use of insulin [facility])    [facility] Margarita Mail, DO   11 months ago Pre-diabetes   [facility] Margar[facility] Mail, DO   1 year ago Pre-diabetes   [facility] Margarita Mail, DO   1 year ago Pre-diabetes   [facility] Caro Laroche, DO   2 years ago Mixed hyperlipidemia   [facility] Jamelle Haring, MD       Future Appointments             In 3 weeks Margarita Mail, DO [facility]

## 2022-08-18 ENCOUNTER — Telehealth: Payer: Self-pay | Admitting: Internal Medicine

## 2022-08-18 NOTE — Telephone Encounter (Signed)
Contacted Vincent Guerra to schedule their annual wellness visit. Patient declined to schedule AWV at this time. Please discuss at next visit.  [facility] Care Guide [facility] AWV TEAM Direct Dial: [phone]

## 2022-09-08 ENCOUNTER — Ambulatory Visit: Payer: Medicare Other | Admitting: Internal Medicine

## 2022-09-18 NOTE — Progress Notes (Signed)
Established Patient Office Visit  Subjective:  Patient ID: Vincent Guerra, male    DOB: 1950/04/14  Age: 72 y.o. MRN: [id number]  CC:  Chief Complaint  Patient presents with   Follow-up   Diabetes    Does not want to take metformin anymore due to going to the bathroom to much.    HPI Vincent Guerra presents for follow up on chronic medical conditions.   Diabetes, type II: -Last A1c 1/24 6.8% - newly diagnosed at LOV but had been doing joint injections at the time -Was started on metformin 500 mg XR at last office visit but cannot tolerate due to abdominal side effects  HLD: -Medications: Simvastatin 40 mg -Patient is compliant with above medications and reports no side effects.  -Last lipid panel: Lipid Panel     Component Value Date/Time   CHOL 174 03/11/2021 1121   CHOL 183 08/18/2011 1510   TRIG 92 03/11/2021 1121   TRIG 139 08/18/2011 1510   HDL 52 03/11/2021 1121   HDL 42 08/18/2011 1510   CHOLHDL 3.3 03/11/2021 1121   VLDL 28 08/18/2011 1510   LDLCALC 103 (H) 03/11/2021 1121   LDLCALC 113 (H) 08/18/2011 1510   The 10-year ASCVD risk score (Arnett DK, et al., 2019) is: 31.6%   Values used to calculate the score:     Age: 25 years     Sex: Male     Is Non-Hispanic African American: No     Diabetic: Yes     Tobacco smoker: No     Systolic Blood Pressure: 128 mmHg     Is BP treated: No     HDL Cholesterol: 52 mg/dL     Total Cholesterol: 174 mg/dL  BPH: -Currently on Finasteride 5 mg and Flomax 0.4 mg daily.  -Last PSA 7/23 0.17, does have a family history of prostate cancer in his paternal grandfather and father - both of which were in their 30's when they were diagnosed but father lived to age 80.  GERD: -Currently on Pepcid 20 mg BID, doing well and symptoms stable.   Arthritis: -Had been on Ibuprofen, now on Mobic 15 mg daily, taking with food   Allergic Rhinitis: -Currently on Claritin and Flonase but only once a day (causes nose bleeds if  used more frequently), occasionally using Afrin at night -History of broken nose several times, deviated septum   Health Maintenance: -Blood work due -Colon cancer screening: Cologuard 2/23 negative   Past Medical History:  Diagnosis Date   Allergy    Arthritis    Diverticulosis    GERD (gastroesophageal reflux disease)    Hyperlipidemia     Past Surgical History:  Procedure Laterality Date   BACK SURGERY  01/17/2015   Dr Wyline Mood Marcy Panning), Multiple diskectomy and herniated disc   CHOLECYSTECTOMY  07/07/2019   TOTAL KNEE ARTHROPLASTY Left 01/17/2019   Procedure: TOTAL KNEE ARTHROPLASTY;  Surgeon: Dannielle Huh, MD;  Location: [facility];  Service: Orthopedics;  Laterality: Left;  75 mins needed for length of case. 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, Street)  Tobacco Use   Smoking status: Former    Current packs/day: 0.00    Types: Cigarettes    Quit date: 1994    Years since quitting: 30.6   Smokeless tobacco: Never  Vaping Use   Vaping status: Never Used  Substance and Sexual Activity   Alcohol use: Yes    Alcohol/week: 24.0 standard drinks of alcohol    Types: 24 Cans of beer per week   Drug use: No   Sexual activity: Not on file  Other Topics Concern   Not on file  Social History Narrative   Not on file   Social Determinants of Health   Financial Resource Strain: Low Risk  (10/16/2020)   Overall Financial Resource Strain (CARDIA)    Difficulty of Paying  Living Expenses: Not hard at all  Food Insecurity: No Food Insecurity (10/16/2020)   Hunger Vital Sign    Worried About Running Out of Food in the Last Year: Never true    Ran Out of Food in the Last Year: Never true  Transportation Needs: No Transportation Needs (10/16/2020)   PRAPARE - Administrator, Civil Service (Medical): No    Lack of Transportation (Non-Medical): No  Physical Activity: Insufficiently Active (10/16/2020)   Exercise Vital Sign    Days of Exercise per Week: 7 days    Minutes of Exercise per Session: 20 min  Stress: No Stress Concern Present (10/16/2020)   [employer] of Occupational Health - Occupational Stress Questionnaire    Feeling of Stress : Not at all  Social Connections: Moderately Isolated (10/16/2020)   Social Connection and Isolation Panel [NHANES]    Frequency of Communication with Friends and Family: More than three times a week    Frequency of Social Gatherings with Friends and Family: Twice a week    Attends Religious Services: Never    Database administrator or Organizations: No    Attends Banker Meetings: Never    Marital Status: Married  Catering manager Violence: Not At Risk (10/16/2020)   Humiliation, Afraid, Rape, and Kick questionnaire    Fear of Current or Ex-Partner: No    Emotionally Abused: No    Physically Abused: No    Sexually Abused: No    Outpatient Medications Prior to Visit  Medication Sig Dispense Refill   aspirin 81 MG chewable tablet Chew by mouth daily.     diclofenac sodium (VOLTAREN) 1 % GEL Apply 2 g topically 3 (three) times daily as needed. Use for hands and knee arthritis, joint pain (Patient taking differently: Apply 2 g topically 3 (three) times daily as needed (shoulder pain/hands & knee pain.).) 100 g 2   finasteride (PROSCAR) 5 MG tablet Take 1 tablet (5 mg total) by mouth daily. 90 tablet 3   loratadine (CLARITIN) 10 MG tablet Take 10 mg by mouth 2 (two) times daily.      Magnesium 500 MG  TABS Take 500 mg by mouth at bedtime.     Multiple Vitamin (MULTIVITAMIN WITH MINERALS) TABS tablet Take 1 tablet by mouth at bedtime.     Omega-3 Fatty Acids (FISH OIL) 1200 MG CAPS Take 1,200 mg by mouth 2 (two) times daily.      Potassium 99  MG TABS Take 1 tablet by mouth in the morning.     Turmeric 500 MG CAPS Take 500 mg by mouth every evening.     famotidine (PEPCID) 20 MG tablet TAKE 1 TABLET BY MOUTH 2 TIMES DAILY AS NEEDED FOR HEARTBURN OR INDIGESTION 180 tablet 1   fluticasone (FLONASE) 50 MCG/ACT nasal spray SPRAY TWICE INTO EACH NOSTRIL EVERY MORNING 16 g 0   meloxicam (MOBIC) 15 MG tablet Take 1 tablet (15 mg total) by mouth daily. 90 tablet 0   metFORMIN (GLUCOPHAGE-XR) 500 MG 24 hr tablet TAKE 1 TABLET BY MOUTH DAILY WITH BREAKFAST. 30 tablet 1   simvastatin (ZOCOR) 40 MG tablet Take 1 tablet (40 mg total) by mouth at bedtime. 90 tablet 1   tamsulosin (FLOMAX) 0.4 MG CAPS capsule TAKE 1 CAPSULE BY MOUTH ONCE DAILY 90 capsule 1   IBU 800 MG tablet TAKE 1 TABLET BY MOUTH EVERY 8 HOURS AS NEEDED.**TAKE WITH FOOD** (Patient not taking: Reported on 07/08/2022) 270 tablet 1   No facility-administered medications prior to visit.    Allergies  Allergen Reactions   Metformin And Related Diarrhea    Even extended release   Metronidazole Hives   Tetanus Toxoid Adsorbed Hives    ROS Review of Systems  Constitutional:  Negative for chills and fever.  Eyes:  Negative for visual disturbance.  Respiratory:  Negative for cough.   Cardiovascular:  Negative for chest pain.  Musculoskeletal:  Positive for arthralgias.      Objective:    Physical Exam Constitutional:      Appearance: Normal appearance.  HENT:     Head: Normocephalic and atraumatic.  Eyes:     Conjunctiva/sclera: Conjunctivae normal.  Cardiovascular:     Rate and Rhythm: Normal rate and regular rhythm.     Pulses:          Dorsalis pedis pulses are 2+ on the right side and 2+ on the left side.  Pulmonary:      Effort: Pulmonary effort is normal.     Breath sounds: Normal breath sounds.  Musculoskeletal:     Right lower leg: No edema.     Left lower leg: No edema.     Right foot: Normal range of motion. No deformity, bunion, Charcot foot, foot drop or prominent metatarsal heads.     Left foot: Normal range of motion. No deformity, bunion, Charcot foot, foot drop or prominent metatarsal heads.  Feet:     Right foot:     Protective Sensation: 7 sites tested.  7 sites sensed.     Skin integrity: Skin integrity normal.     Toenail Condition: Right toenails are normal.     Left foot:     Protective Sensation: 7 sites tested.  7 sites sensed.     Skin integrity: Skin integrity normal.     Toenail Condition: Left toenails are normal.  Skin:    General: Skin is warm and dry.  Neurological:     General: No focal deficit present.     Mental Status: He is alert. Mental status is at baseline.  Psychiatric:        Mood and Affect: Mood normal.        Behavior: Behavior normal.     BP 128/80   Pulse 80   Temp 98.1 F (36.7 C)   Resp 18   Ht 5\' 10"  (1.778 m)   Wt 243 lb 14.4 oz (110.6 kg)   SpO2 93%   BMI 35.00  kg/m  Wt Readings from Last 3 Encounters:  09/19/22 243 lb 14.4 oz (110.6 kg)  03/12/22 245 lb 6.4 oz (111.3 kg)  10/17/21 250 lb (113.4 kg)     Health Maintenance Due  Topic Date Due   INFLUENZA VACCINE  09/11/2022   Medicare Annual Wellness (AWV)  10/18/2022     There are no preventive care reminders to display for this patient.  Lab Results  Component Value Date   TSH 2.21 11/11/2016   Lab Results  Component Value Date   WBC 8.5 09/09/2021   HGB 15.1 09/09/2021   HCT 44.8 09/09/2021   MCV 91.6 09/09/2021   PLT 176 09/09/2021   Lab Results  Component Value Date   NA 139 09/09/2021   K 4.6 09/09/2021   CO2 24 09/09/2021   GLUCOSE 113 (H) 09/09/2021   BUN 15 09/09/2021   CREATININE 0.93 09/09/2021   BILITOT 0.5 09/09/2021   ALKPHOS 49 07/04/2019   AST 22  09/09/2021   ALT 43 09/09/2021   PROT 6.5 09/09/2021   ALBUMIN 4.3 07/04/2019   CALCIUM 9.1 09/09/2021   ANIONGAP 8 07/04/2019   EGFR 88 09/09/2021   Lab Results  Component Value Date   CHOL 174 03/11/2021   Lab Results  Component Value Date   HDL 52 03/11/2021   Lab Results  Component Value Date   LDLCALC 103 (H) 03/11/2021   Lab Results  Component Value Date   TRIG 92 03/11/2021   Lab Results  Component Value Date   CHOLHDL 3.3 03/11/2021   Lab Results  Component Value Date   HGBA1C 6.8 (A) 03/12/2022      Assessment & Plan:   1. Type 2 diabetes mellitus with hyperglycemia, without long-term current use of insulin [facility]): Check A1c today, could not tolerate Metformin due to GI side effects, will discontinue. Has not had steroid injections in a few months. Microalbumin and foot exam today.   - CBC w/Diff/Platelet - COMPLETE METABOLIC PANEL WITH GFR - HgB Z6X - Urine Microalbumin w/creat. ratio - HM Diabetes Foot Exam  2. Mixed hyperlipidemia: Check lipid panel today, refill Zocor 40 mg.   - Lipid Profile - simvastatin (ZOCOR) 40 MG tablet; Take 1 tablet (40 mg total) by mouth at bedtime.  Dispense: 90 tablet; Refill: 1  3. Primary osteoarthritis involving multiple joints: Will refill Mobic 15 mg to use as needed, taking with food.   - meloxicam (MOBIC) 15 MG tablet; Take 1 tablet (15 mg total) by mouth daily.  Dispense: 90 tablet; Refill: 1  4. Gastroesophageal reflux disease without esophagitis: Symptoms stable, continue Pepcid, refilled.  - famotidine (PEPCID) 20 MG tablet; TAKE 1 TABLET BY MOUTH 2 TIMES DAILY AS NEEDED FOR HEARTBURN OR INDIGESTION  Dispense: 180 tablet; Refill: 1  5. BPH with obstruction/lower urinary tract symptoms/Prostate cancer screening: Stable, refill Flomax. PSA screening today.  - PSA - tamsulosin (FLOMAX) 0.4 MG CAPS capsule; Take 1 capsule (0.4 mg total) by mouth daily.  Dispense: 90 capsule; Refill: 1   Follow-up: Return in  about 6 months (around 03/22/2023).    Margarita Mail, DO

## 2022-09-19 ENCOUNTER — Ambulatory Visit (INDEPENDENT_AMBULATORY_CARE_PROVIDER_SITE_OTHER): Payer: Medicare Other | Admitting: Internal Medicine

## 2022-09-19 ENCOUNTER — Encounter: Payer: Self-pay | Admitting: Internal Medicine

## 2022-09-19 VITALS — BP 128/80 | HR 80 | Temp 98.1°F | Resp 18 | Ht 70.0 in | Wt 243.9 lb

## 2022-09-19 DIAGNOSIS — K219 Gastro-esophageal reflux disease without esophagitis: Secondary | ICD-10-CM | POA: Diagnosis not present

## 2022-09-19 DIAGNOSIS — Z125 Encounter for screening for malignant neoplasm of prostate: Secondary | ICD-10-CM | POA: Diagnosis not present

## 2022-09-19 DIAGNOSIS — E1165 Type 2 diabetes mellitus with hyperglycemia: Secondary | ICD-10-CM

## 2022-09-19 DIAGNOSIS — N401 Enlarged prostate with lower urinary tract symptoms: Secondary | ICD-10-CM | POA: Diagnosis not present

## 2022-09-19 DIAGNOSIS — E782 Mixed hyperlipidemia: Secondary | ICD-10-CM

## 2022-09-19 DIAGNOSIS — M159 Polyosteoarthritis, unspecified: Secondary | ICD-10-CM

## 2022-09-19 DIAGNOSIS — N138 Other obstructive and reflux uropathy: Secondary | ICD-10-CM | POA: Diagnosis not present

## 2022-09-19 DIAGNOSIS — M150 Primary generalized (osteo)arthritis: Secondary | ICD-10-CM

## 2022-09-19 LAB — CBC WITH DIFFERENTIAL/PLATELET
Absolute Monocytes: 708 cells/uL (ref 200–950)
Basophils Absolute: 58 cells/uL (ref 0–200)
Basophils Relative: 0.8 %
Eosinophils Absolute: 146 cells/uL (ref 15–500)
Eosinophils Relative: 2 %
HCT: 42.9 % (ref 38.5–50.0)
Hemoglobin: 14.7 g/dL (ref 13.2–17.1)
Lymphs Abs: 1781 cells/uL (ref 850–3900)
MCH: 31.5 pg (ref 27.0–33.0)
MCHC: 34.3 g/dL (ref 32.0–36.0)
MCV: 92.1 fL (ref 80.0–100.0)
MPV: 9.8 fL (ref 7.5–12.5)
Monocytes Relative: 9.7 %
Neutro Abs: 4606 cells/uL (ref 1500–7800)
Neutrophils Relative %: 63.1 %
Platelets: 198 10*3/uL (ref 140–400)
RBC: 4.66 10*6/uL (ref 4.20–5.80)
RDW: 12.6 % (ref 11.0–15.0)
Total Lymphocyte: 24.4 %
WBC: 7.3 10*3/uL (ref 3.8–10.8)

## 2022-09-19 MED ORDER — FLUTICASONE PROPIONATE 50 MCG/ACT NA SUSP: NASAL | 0 refills | Status: DC

## 2022-09-19 MED ORDER — TAMSULOSIN HCL 0.4 MG PO CAPS: 0.4000 mg | ORAL_CAPSULE | Freq: Every day | ORAL | 1 refills | Status: DC

## 2022-09-19 MED ORDER — FAMOTIDINE 20 MG PO TABS: ORAL_TABLET | ORAL | 1 refills | Status: DC

## 2022-09-19 MED ORDER — MELOXICAM 15 MG PO TABS: 15.0000 mg | ORAL_TABLET | Freq: Every day | ORAL | 1 refills | Status: DC

## 2022-09-19 MED ORDER — SIMVASTATIN 40 MG PO TABS: 40.0000 mg | ORAL_TABLET | Freq: Every day | ORAL | 1 refills | Status: DC

## 2022-10-14 ENCOUNTER — Other Ambulatory Visit: Payer: Self-pay | Admitting: Internal Medicine

## 2022-10-16 NOTE — Telephone Encounter (Signed)
Requested Prescriptions  Pending Prescriptions Disp Refills   fluticasone (FLONASE) 50 MCG/ACT nasal spray [Pharmacy Med Name: FLUTICASONE PROPIONATE 50 MCG/ACT N] 16 g 2    Sig: SPRAY TWICE INTO EACH NOSTRIL EVERY MORNING     Ear, Nose, and Throat: Nasal Preparations - Corticosteroids Passed - 10/14/2022  9:57 AM      Passed - Valid encounter within last 12 months    Recent Outpatient Visits           3 weeks ago Type 2 diabetes mellitus with hyperglycemia, without long-term current use of insulin [facility])   [facility] Margarita Mail, DO   7 months ago Type 2 diabetes mellitus with hyperglycemia, without long-term current use of insulin [facility])   [place] [facility] Margarita Mail, DO   1 year ago Pre-diabetes   [facility] Margarita Mail, DO   1 year ago Pre-diabetes   [facility] Margarita Mail, DO   2 years ago Pre-diabetes   [facility] Caro Laroche, DO       Future Appointments             In 5 months Margarita Mail, DO [facility]

## 2022-10-21 DIAGNOSIS — M5416 Radiculopathy, lumbar region: Secondary | ICD-10-CM | POA: Diagnosis not present

## 2022-10-23 DIAGNOSIS — G8929 Other chronic pain: Secondary | ICD-10-CM | POA: Diagnosis not present

## 2022-10-23 DIAGNOSIS — M19012 Primary osteoarthritis, left shoulder: Secondary | ICD-10-CM | POA: Diagnosis not present

## 2022-10-23 DIAGNOSIS — M19011 Primary osteoarthritis, right shoulder: Secondary | ICD-10-CM | POA: Diagnosis not present

## 2022-10-24 ENCOUNTER — Other Ambulatory Visit: Payer: Self-pay | Admitting: Orthopedic Surgery

## 2022-10-24 ENCOUNTER — Encounter: Payer: Self-pay | Admitting: Orthopedic Surgery

## 2022-10-24 DIAGNOSIS — M545 Low back pain, unspecified: Secondary | ICD-10-CM

## 2022-11-27 DIAGNOSIS — Z85828 Personal history of other malignant neoplasm of skin: Secondary | ICD-10-CM | POA: Diagnosis not present

## 2022-11-27 DIAGNOSIS — L570 Actinic keratosis: Secondary | ICD-10-CM | POA: Diagnosis not present

## 2022-11-27 DIAGNOSIS — D2272 Melanocytic nevi of left lower limb, including hip: Secondary | ICD-10-CM | POA: Diagnosis not present

## 2022-11-27 DIAGNOSIS — Z09 Encounter for follow-up examination after completed treatment for conditions other than malignant neoplasm: Secondary | ICD-10-CM | POA: Diagnosis not present

## 2022-11-27 DIAGNOSIS — D2271 Melanocytic nevi of right lower limb, including hip: Secondary | ICD-10-CM | POA: Diagnosis not present

## 2022-11-27 DIAGNOSIS — D225 Melanocytic nevi of trunk: Secondary | ICD-10-CM | POA: Diagnosis not present

## 2022-11-27 DIAGNOSIS — D2262 Melanocytic nevi of left upper limb, including shoulder: Secondary | ICD-10-CM | POA: Diagnosis not present

## 2022-11-27 DIAGNOSIS — Z872 Personal history of diseases of the skin and subcutaneous tissue: Secondary | ICD-10-CM | POA: Diagnosis not present

## 2022-11-27 DIAGNOSIS — D2261 Melanocytic nevi of right upper limb, including shoulder: Secondary | ICD-10-CM | POA: Diagnosis not present

## 2022-11-27 DIAGNOSIS — L821 Other seborrheic keratosis: Secondary | ICD-10-CM | POA: Diagnosis not present

## 2022-11-27 DIAGNOSIS — Z86006 Personal history of melanoma in-situ: Secondary | ICD-10-CM | POA: Diagnosis not present

## 2022-11-27 DIAGNOSIS — Z85820 Personal history of malignant melanoma of skin: Secondary | ICD-10-CM | POA: Diagnosis not present

## 2022-12-26 NOTE — Discharge Instructions (Signed)
Post Procedure Spinal Discharge Instruction Sheet  You may resume a regular diet and any medications that you routinely take (including pain medications) unless otherwise noted by MD.  No driving day of procedure.  Light activity throughout the rest of the day.  Do not do any strenuous work, exercise, bending or lifting.  The day following the procedure, you can resume normal physical activity but you should refrain from exercising or physical therapy for at least three days thereafter.  You may apply ice to the injection site, 20 minutes on, 20 minutes off, as needed. Do not apply ice directly to skin.    Common Side Effects:  Headaches- take your usual medications as directed by your physician.  Increase your fluid intake.  Caffeinated beverages may be helpful.  Lie flat in bed until your headache resolves.  Restlessness or inability to sleep- you may have trouble sleeping for the next few days.  Ask your referring physician if you need any medication for sleep.  Facial flushing or redness- should subside within a few days.  Increased pain- a temporary increase in pain a day or two following your procedure is not unusual.  Take your pain medication as prescribed by your referring physician.  Leg cramps  Please contact our office at [phone] for the following symptoms: Fever greater than 100 degrees. Headaches unresolved with medication after 2-3 days. Increased swelling, pain, or redness at injection site.   May resume aspirin immediately after injection.  Thank you for visiting [facility]!

## 2022-12-30 ENCOUNTER — Other Ambulatory Visit: Payer: Medicare Other

## 2022-12-30 ENCOUNTER — Inpatient Hospital Stay
Admission: RE | Admit: 2022-12-30 | Discharge: 2022-12-30 | Disposition: A | Discharge status: Home / Self Care | Payer: Medicare Other | Source: Ambulatory Visit | Attending: Orthopedic Surgery | Admitting: Orthopedic Surgery

## 2022-12-30 DIAGNOSIS — M4726 Other spondylosis with radiculopathy, lumbar region: Secondary | ICD-10-CM | POA: Diagnosis not present

## 2022-12-30 DIAGNOSIS — M545 Low back pain, unspecified: Secondary | ICD-10-CM

## 2022-12-30 MED ORDER — METHYLPREDNISOLONE ACETATE 40 MG/ML INJ SUSP (RADIOLOG
80.0000 mg | Freq: Once | INTRAMUSCULAR | Status: AC
  Administered 2022-12-30: 80 mg via EPIDURAL

## 2022-12-30 MED ORDER — IOPAMIDOL (ISOVUE-M 300) INJECTION 61%
1.0000 mL | Freq: Once | INTRAMUSCULAR | Status: AC | PRN
  Administered 2022-12-30: 1 mL via EPIDURAL

## 2023-01-07 ENCOUNTER — Encounter: Payer: Self-pay | Admitting: Orthopedic Surgery

## 2023-01-07 ENCOUNTER — Other Ambulatory Visit: Payer: Self-pay | Admitting: Orthopedic Surgery

## 2023-01-07 DIAGNOSIS — M545 Low back pain, unspecified: Secondary | ICD-10-CM

## 2023-01-26 NOTE — Discharge Instructions (Signed)

## 2023-01-27 ENCOUNTER — Ambulatory Visit
Admission: RE | Admit: 2023-01-27 | Discharge: 2023-01-27 | Disposition: A | Discharge status: Home / Self Care | Payer: Medicare Other | Source: Ambulatory Visit | Attending: Orthopedic Surgery | Admitting: Orthopedic Surgery

## 2023-01-27 DIAGNOSIS — M4727 Other spondylosis with radiculopathy, lumbosacral region: Secondary | ICD-10-CM | POA: Diagnosis not present

## 2023-01-27 DIAGNOSIS — M545 Low back pain, unspecified: Secondary | ICD-10-CM

## 2023-01-27 MED ORDER — METHYLPREDNISOLONE ACETATE 40 MG/ML INJ SUSP (RADIOLOG
80.0000 mg | Freq: Once | INTRAMUSCULAR | Status: AC
  Administered 2023-01-27: 80 mg via EPIDURAL

## 2023-01-27 MED ORDER — IOPAMIDOL (ISOVUE-M 200) INJECTION 41%
1.0000 mL | Freq: Once | INTRAMUSCULAR | Status: AC
  Administered 2023-01-27: 1 mL via EPIDURAL

## 2023-01-29 DIAGNOSIS — M25511 Pain in right shoulder: Secondary | ICD-10-CM | POA: Diagnosis not present

## 2023-01-29 DIAGNOSIS — G8929 Other chronic pain: Secondary | ICD-10-CM | POA: Diagnosis not present

## 2023-01-29 DIAGNOSIS — M19012 Primary osteoarthritis, left shoulder: Secondary | ICD-10-CM | POA: Diagnosis not present

## 2023-01-29 DIAGNOSIS — M19011 Primary osteoarthritis, right shoulder: Secondary | ICD-10-CM | POA: Diagnosis not present

## 2023-01-29 DIAGNOSIS — M25512 Pain in left shoulder: Secondary | ICD-10-CM | POA: Diagnosis not present

## 2023-03-04 ENCOUNTER — Other Ambulatory Visit: Payer: Self-pay | Admitting: Internal Medicine

## 2023-03-04 DIAGNOSIS — N138 Other obstructive and reflux uropathy: Secondary | ICD-10-CM

## 2023-03-04 NOTE — Telephone Encounter (Signed)
Requested Prescriptions  Pending Prescriptions Disp Refills   tamsulosin (FLOMAX) 0.4 MG CAPS capsule [Pharmacy Med Name: TAMSULOSIN HCL 0.4 MG CAP] 90 capsule 1    Sig: TAKE ONE CAPSULE DAILY 30 MINUTES AFTER THE SAME MEAL EACH DAY     Urology: Alpha-Adrenergic Blocker Failed - 03/04/2023  1:56 PM      Failed - Last BP in normal range    BP Readings from Last 1 Encounters:  01/27/23 (!) 149/91         Passed - PSA in normal range and within 360 days    PSA  Date Value Ref Range Status  09/19/2022 0.17 < OR = 4.00 ng/mL Final    Comment:    The total PSA value from this assay system is  standardized against the WHO standard. The test  result will be approximately 20% lower when compared  to the equimolar-standardized total PSA (Beckman  Coulter). Comparison of serial PSA results should be  interpreted with this fact in mind. . This test was performed using the Siemens  chemiluminescent method. Values obtained from  different assay methods cannot be used interchangeably. PSA levels, regardless of value, should not be interpreted as absolute evidence of the presence or absence of disease.          Passed - Valid encounter within last 12 months    Recent Outpatient Visits           5 months ago Type 2 diabetes mellitus with hyperglycemia, without long-term current use of insulin [facility])   [place] [facility] Margarita Mail, DO   11 months ago Type 2 diabetes mellitus with hyperglycemia, without long-term current use of insulin [facility])   [place] [facility] Margarita Mail, DO   1 year ago Pre-diabetes   [facility] Margarita Mail, DO   1 year ago Pre-diabetes   [facility] Margarita Mail, DO   2 years ago Pre-diabetes   [facility] Caro Laroche, DO       Future Appointments             In 2 weeks Margarita Mail, DO [place]  [facility], [facility]

## 2023-03-17 ENCOUNTER — Other Ambulatory Visit: Payer: Self-pay | Admitting: Internal Medicine

## 2023-03-17 DIAGNOSIS — E782 Mixed hyperlipidemia: Secondary | ICD-10-CM

## 2023-03-19 NOTE — Telephone Encounter (Signed)
 Requested Prescriptions  Pending Prescriptions Disp Refills   simvastatin  (ZOCOR ) 40 MG tablet [Pharmacy Med Name: SIMVASTATIN  40 MG TAB] 90 tablet 0    Sig: TAKE 1 TABLET BY MOUTH AT BEDTIME     Cardiovascular:  Antilipid - Statins Failed - 03/19/2023  8:32 AM      Failed - Lipid Panel in normal range within the last 12 months    Cholesterol  Date Value Ref Range Status  09/19/2022 142 <200 mg/dL Final  92/91/7986 816 0 - 200 mg/dL Final   Ldl Cholesterol, Calc  Date Value Ref Range Status  08/18/2011 113 (H) 0 - 100 mg/dL Final   LDL Cholesterol (Calc)  Date Value Ref Range Status  09/19/2022 77 mg/dL (calc) Final    Comment:    Reference range: <100 . Desirable range <100 mg/dL for primary prevention;   <70 mg/dL for patients with CHD or diabetic patients  with > or = 2 CHD risk factors. [name] LDL-C is now calculated using the Martin-Hopkins  calculation, which is a validated novel method providing  better accuracy than the Friedewald equation in the  estimation of LDL-C.  Gladis APPLETHWAITE et al. SANDREA. 7986;689(80): 2061-2068  (http://education.[employer].com/faq/FAQ164)    HDL Cholesterol  Date Value Ref Range Status  08/18/2011 42 40 - 60 mg/dL Final   HDL  Date Value Ref Range Status  09/19/2022 47 > OR = 40 mg/dL Final   Triglycerides  Date Value Ref Range Status  09/19/2022 95 <150 mg/dL Final  92/91/7986 860 0 - 200 mg/dL Final         Passed - Patient is not pregnant      Passed - Valid encounter within last 12 months    Recent Outpatient Visits           6 months ago Type 2 diabetes mellitus with hyperglycemia, without long-term current use of insulin  [facility])   [place] [facility] Bernardo Fend, DO   1 year ago Type 2 diabetes mellitus with hyperglycemia, without long-term current use of insulin  [facility])   [facility] Bernardo Fend, DO   1 year ago Pre-diabetes   [facility]  Bernardo Fend, DO   2 years ago Pre-diabetes   [facility] Bernardo Fend, DO   2 years ago Pre-diabetes   [facility] Madelon Donald HERO, DO       Future Appointments             In 4 days Bernardo Fend, DO [facility], [facility]

## 2023-03-23 ENCOUNTER — Other Ambulatory Visit: Payer: Self-pay

## 2023-03-23 ENCOUNTER — Encounter: Payer: Self-pay | Admitting: Internal Medicine

## 2023-03-23 ENCOUNTER — Ambulatory Visit: Payer: Medicare Other | Admitting: Internal Medicine

## 2023-03-23 VITALS — BP 124/78 | HR 98 | Temp 98.6°F | Resp 18 | Ht 70.0 in | Wt 235.0 lb

## 2023-03-23 DIAGNOSIS — N401 Enlarged prostate with lower urinary tract symptoms: Secondary | ICD-10-CM

## 2023-03-23 DIAGNOSIS — E782 Mixed hyperlipidemia: Secondary | ICD-10-CM | POA: Diagnosis not present

## 2023-03-23 DIAGNOSIS — K219 Gastro-esophageal reflux disease without esophagitis: Secondary | ICD-10-CM | POA: Diagnosis not present

## 2023-03-23 DIAGNOSIS — Z23 Encounter for immunization: Secondary | ICD-10-CM | POA: Diagnosis not present

## 2023-03-23 DIAGNOSIS — N138 Other obstructive and reflux uropathy: Secondary | ICD-10-CM

## 2023-03-23 DIAGNOSIS — Z7984 Long term (current) use of oral hypoglycemic drugs: Secondary | ICD-10-CM | POA: Diagnosis not present

## 2023-03-23 DIAGNOSIS — M150 Primary generalized (osteo)arthritis: Secondary | ICD-10-CM | POA: Diagnosis not present

## 2023-03-23 DIAGNOSIS — E1165 Type 2 diabetes mellitus with hyperglycemia: Secondary | ICD-10-CM | POA: Diagnosis not present

## 2023-03-23 LAB — POCT GLYCOSYLATED HEMOGLOBIN (HGB A1C): Hemoglobin A1C: 7.4 % — AB (ref 4.0–5.6)

## 2023-03-23 MED ORDER — RYBELSUS 3 MG PO TABS: 3.0000 mg | ORAL_TABLET | Freq: Every day | ORAL | 1 refills | Status: DC

## 2023-03-23 MED ORDER — MELOXICAM 15 MG PO TABS: 15.0000 mg | ORAL_TABLET | Freq: Every day | ORAL | 1 refills | Status: DC

## 2023-03-23 MED ORDER — FAMOTIDINE 20 MG PO TABS: ORAL_TABLET | ORAL | 1 refills | Status: DC

## 2023-03-23 NOTE — Assessment & Plan Note (Signed)
 Stable, continue Finasteride  and Flomax .

## 2023-03-23 NOTE — Progress Notes (Signed)
 Established Patient Office Visit  Subjective   Patient ID: Vincent Guerra, male    DOB: Sep 11, 1950  Age: 73 y.o. MRN: [id number]  Chief Complaint  Patient presents with   Medical Management of Chronic Issues    6 month recheck    HPI  Vincent Guerra presents for follow up on chronic medical conditions.   Diabetes, type II: -Last A1c 8/24 6.2% -Was started on metformin  500 mg XR at last office visit but cannot tolerate due to abdominal side effects -Undergoes regular steroid spine injections - had 2 injections in December   HLD: -Medications: Simvastatin  40 mg -Patient is compliant with above medications and reports no side effects.  -Last lipid panel: Lipid Panel     Component Value Date/Time   CHOL 142 09/19/2022 1023   CHOL 183 08/18/2011 1510   TRIG 95 09/19/2022 1023   TRIG 139 08/18/2011 1510   HDL 47 09/19/2022 1023   HDL 42 08/18/2011 1510   CHOLHDL 3.0 09/19/2022 1023   VLDL 28 08/18/2011 1510   LDLCALC 77 09/19/2022 1023   LDLCALC 113 (H) 08/18/2011 1510   BPH: -Currently on Finasteride  5 mg and Flomax  0.4 mg daily.  -Last PSA 8/24 0.17, does have a family history of prostate cancer in his paternal grandfather and father - both of which were in their 1's when they were diagnosed but father lived to age 28.  GERD: -Currently on Pepcid  20 mg BID, doing well and symptoms stable.   Arthritis: -Had been on Ibuprofen , now on Mobic  15 mg daily, taking with food   Allergic Rhinitis: -Currently on Claritin  and Flonase  but only once a day (causes nose bleeds if used more frequently), occasionally using Afrin at night -History of broken nose several times, deviated septum   Health Maintenance: -Blood work UTD -Colon cancer screening: Cologuard 2/23 negative   Patient Active Problem List   Diagnosis Date Noted   Type 2 diabetes mellitus with hyperglycemia, without long-term current use of insulin  (HCC) 03/23/2023   Chronic pain of both shoulders  02/07/2021   Localized osteoarthritis of both shoulder regions 02/07/2021   Family history of prostate cancer in father 09/01/2019   Leukocytosis 07/05/2019   Calculus of gallbladder without cholecystitis without obstruction 07/05/2019   Encounter for monitoring chronic NSAID therapy 04/27/2019   Abdominal muscle strain, subsequent encounter 04/27/2019   GERD (gastroesophageal reflux disease) 08/31/2018   Allergic rhinitis 03/11/2018   Alcohol use 03/11/2018   Obesity (BMI 35.0-39.9 without comorbidity) 08/03/2017   Obesity (BMI 35.0-39.9 without comorbidity) 08/03/2017   Environmental and seasonal allergies 08/22/2016   Diverticulosis 08/22/2016   BPH with obstruction/lower urinary tract symptoms 08/22/2016   Pre-diabetes 08/22/2016   Osteoarthritis of multiple joints 08/22/2016   Degenerative joint disease (DJD) of lumbar spine 08/22/2016   History of sciatica 08/22/2016   Hyperlipidemia 08/22/2016   BPH with obstruction/lower urinary tract symptoms 08/22/2016   Diverticulosis 08/22/2016   Environmental and seasonal allergies 08/22/2016   Osteoarthritis of multiple joints 08/22/2016   Pre-diabetes 08/22/2016   Past Medical History:  Diagnosis Date   Allergy    Arthritis    Diverticulosis    GERD (gastroesophageal reflux disease)    Hyperlipidemia    Past Surgical History:  Procedure Laterality Date   BACK SURGERY  01/17/2015   Dr Amanda Jungling Jayson Michael), Multiple diskectomy and herniated disc   CHOLECYSTECTOMY  07/07/2019   TOTAL KNEE ARTHROPLASTY Left 01/17/2019   Procedure: TOTAL KNEE ARTHROPLASTY;  Surgeon: Christie Cox, MD;  Location: [facility];  Service: Orthopedics;  Laterality: Left;  75 mins needed for length of case. same day discharge   VASECTOMY     Social History   Tobacco Use   Smoking status: Former    Current packs/day: 0.00    Types: Cigarettes    Quit date: 1994    Years since quitting: 31.1   Smokeless tobacco: Never  Vaping Use   Vaping status:  Never Used  Substance Use Topics   Alcohol use: Yes    Alcohol/week: 24.0 standard drinks of alcohol    Types: 24 Cans of beer per week   Drug use: No   Social History   Socioeconomic History   Marital status: Married    Spouse name: Lenon Radar   Number of children: 2   Years of education: High School   Highest education level: High school graduate  Occupational History   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, Street)  Tobacco Use   Smoking status: Former    Current packs/day: 0.00    Types: Cigarettes    Quit date: 1994    Years since quitting: 31.1   Smokeless tobacco: Never  Vaping Use   Vaping status: Never Used  Substance and Sexual Activity   Alcohol use: Yes    Alcohol/week: 24.0 standard drinks of alcohol    Types: 24 Cans of beer per week   Drug use: No   Sexual activity: Not on file  Other Topics Concern   Not on file  Social History Narrative   Not on file   Social Drivers of Health   Financial Resource Strain: Low Risk  (10/16/2020)   Overall Financial Resource Strain (CARDIA)    Difficulty of Paying Living Expenses: Not hard at all  Food Insecurity: No Food Insecurity (10/16/2020)   Hunger Vital Sign    Worried About Running Out of Food in the Last Year: Never true    Ran Out of Food in the Last Year: Never true  Transportation Needs: No Transportation Needs (10/16/2020)   PRAPARE - Administrator, Civil Service (Medical): No    Lack of Transportation (Non-Medical): No  Physical Activity: Insufficiently Active (10/16/2020)   Exercise Vital Sign    Days of Exercise per Week: 7 days    Minutes of Exercise per Session: 20 min  Stress: No Stress Concern Present (10/16/2020)   [employer] of Occupational Health - Occupational Stress Questionnaire    Feeling of Stress : Not at all  Social Connections: Moderately Isolated (10/16/2020)   Social Connection and Isolation Panel [NHANES]    Frequency of Communication with Friends and Family:  More than three times a week    Frequency of Social Gatherings with Friends and Family: Twice a week    Attends Religious Services: Never    Database administrator or Organizations: No    Attends Banker Meetings: Never    Marital Status: Married  Catering manager Violence: Not At Risk (10/16/2020)   Humiliation, Afraid, Rape, and Kick questionnaire    Fear of Current or Ex-Partner: No    Emotionally Abused: No    Physically Abused: No    Sexually Abused: No   Family Status  Relation Name Status   Mother  Deceased   Father  Deceased   Brother  Alive   Brother  Alive  No partnership data on file   Family History  Problem Relation Age of Onset   Heart disease Mother  Alzheimer's disease Mother    Heart attack Mother    Prostate cancer Father 42   Heart disease Brother 78       CABG   Heart disease Brother        CABG   Allergies  Allergen Reactions   Metformin  And Related Diarrhea    Even extended release   Metronidazole Hives   Tetanus Toxoid Adsorbed Hives      Review of Systems  All other systems reviewed and are negative.     Objective:     BP 124/78 (Cuff Size: Large)   Pulse 98   Temp 98.6 F (37 C) (Oral)   Resp 18   Ht 5\' 10"  (1.778 m)   Wt 235 lb (106.6 kg)   SpO2 99%   BMI 33.72 kg/m  BP Readings from Last 3 Encounters:  03/23/23 124/78  01/27/23 (!) 149/91  12/30/22 (!) 155/96   Wt Readings from Last 3 Encounters:  03/23/23 235 lb (106.6 kg)  09/19/22 243 lb 14.4 oz (110.6 kg)  03/12/22 245 lb 6.4 oz (111.3 kg)      Physical Exam Constitutional:      Appearance: Normal appearance.  HENT:     Head: Normocephalic and atraumatic.     Mouth/Throat:     Mouth: Mucous membranes are moist.     Pharynx: Oropharynx is clear.  Eyes:     Extraocular Movements: Extraocular movements intact.     Conjunctiva/sclera: Conjunctivae normal.     Pupils: Pupils are equal, round, and reactive to light.  Cardiovascular:     Rate and  Rhythm: Normal rate and regular rhythm.  Pulmonary:     Effort: Pulmonary effort is normal.     Breath sounds: Normal breath sounds.  Musculoskeletal:     Right lower leg: No edema.     Left lower leg: No edema.  Skin:    General: Skin is warm and dry.  Neurological:     General: No focal deficit present.     Mental Status: He is alert. Mental status is at baseline.  Psychiatric:        Mood and Affect: Mood normal.        Behavior: Behavior normal.      Results for orders placed or performed in visit on 03/23/23  POCT HgB A1C  Result Value Ref Range   Hemoglobin A1C 7.4 (A) 4.0 - 5.6 %   HbA1c POC (<> result, manual entry)     HbA1c, POC (prediabetic range)     HbA1c, POC (controlled diabetic range)      Last CBC Lab Results  Component Value Date   WBC 7.3 09/19/2022   HGB 14.7 09/19/2022   HCT 42.9 09/19/2022   MCV 92.1 09/19/2022   MCH 31.5 09/19/2022   RDW 12.6 09/19/2022   PLT 198 09/19/2022   Last metabolic panel Lab Results  Component Value Date   GLUCOSE 115 (H) 09/19/2022   NA 141 09/19/2022   K 4.6 09/19/2022   CL 107 09/19/2022   CO2 23 09/19/2022   BUN 12 09/19/2022   CREATININE 1.04 09/19/2022   EGFR 77 09/19/2022   CALCIUM 9.6 09/19/2022   PROT 6.8 09/19/2022   ALBUMIN 4.3 07/04/2019   BILITOT 0.4 09/19/2022   ALKPHOS 49 07/04/2019   AST 20 09/19/2022   ALT 28 09/19/2022   ANIONGAP 8 07/04/2019   Last lipids Lab Results  Component Value Date   CHOL 142 09/19/2022   HDL 47 09/19/2022  LDLCALC 77 09/19/2022   TRIG 95 09/19/2022   CHOLHDL 3.0 09/19/2022   Last hemoglobin A1c Lab Results  Component Value Date   HGBA1C 7.4 (A) 03/23/2023   Last thyroid functions Lab Results  Component Value Date   TSH 2.21 11/11/2016   Last vitamin D No results found for: "25OHVITD2", "25OHVITD3", "VD25OH" Last vitamin B12 and Folate No results found for: "VITAMINB12", "FOLATE"    The 10-year ASCVD risk score (Arnett DK, et al., 2019) is:  28.8%    Assessment & Plan:  Type 2 diabetes mellitus with hyperglycemia, without long-term current use of insulin  (HCC) Assessment & Plan: A1c increased today to 7.4%. He could not tolerate the Metformin , given a sample of Rybelsus  3 mg to take daily. Microalbumin due. He does eat lot of sugar and drinks beer daily, discussed this needs to change or his sugars will continue to increase.   Orders: -     POCT glycosylated hemoglobin (Hb A1C) -     Microalbumin / creatinine urine ratio -     Rybelsus ; Take 1 tablet (3 mg total) by mouth daily.  Dispense: 30 tablet; Refill: 1  Mixed hyperlipidemia Assessment & Plan: Stable, continue statin.   BPH with obstruction/lower urinary tract symptoms Assessment & Plan: Stable, continue Finasteride  and Flomax .    Gastroesophageal reflux disease without esophagitis Assessment & Plan: Stable, doing well on Prilosec.  Orders: -     Famotidine ; TAKE 1 TABLET BY MOUTH 2 TIMES DAILY AS NEEDED FOR HEARTBURN OR INDIGESTION  Dispense: 180 tablet; Refill: 1  Primary osteoarthritis involving multiple joints Assessment & Plan: Refill Mobic  but still requiring multiple steroid injections which are making his sugars higher.   Orders: -     Meloxicam ; Take 1 tablet (15 mg total) by mouth daily.  Dispense: 90 tablet; Refill: 1  Need for influenza vaccination -     Flu Vaccine Trivalent High Dose (Fluad)  Flu vaccine administered today.   Return in about 3 months (around 06/20/2023) for after 5/10 for a1c.    Rockney Cid, DO

## 2023-03-23 NOTE — Assessment & Plan Note (Signed)
 Stable, continue statin

## 2023-03-23 NOTE — Assessment & Plan Note (Signed)
 A1c increased today to 7.4%. He could not tolerate the Metformin , given a sample of Rybelsus  3 mg to take daily. Microalbumin due. He does eat lot of sugar and drinks beer daily, discussed this needs to change or his sugars will continue to increase.

## 2023-03-23 NOTE — Assessment & Plan Note (Signed)
 Stable, doing well on Prilosec.

## 2023-03-23 NOTE — Assessment & Plan Note (Signed)
 Refill Mobic  but still requiring multiple steroid injections which are making his sugars higher.

## 2023-03-24 LAB — MICROALBUMIN / CREATININE URINE RATIO
Creatinine, Urine: 102 mg/dL (ref 20–320)
Microalb Creat Ratio: 4 mg/g{creat} (ref <30)
Microalb, Ur: 0.4 mg/dL

## 2023-03-30 DIAGNOSIS — M5416 Radiculopathy, lumbar region: Secondary | ICD-10-CM | POA: Diagnosis not present

## 2023-04-02 ENCOUNTER — Other Ambulatory Visit: Payer: Self-pay | Admitting: Orthopedic Surgery

## 2023-04-02 DIAGNOSIS — M5416 Radiculopathy, lumbar region: Secondary | ICD-10-CM

## 2023-04-06 ENCOUNTER — Telehealth: Payer: Self-pay

## 2023-04-09 ENCOUNTER — Other Ambulatory Visit: Payer: Self-pay | Admitting: Internal Medicine

## 2023-04-09 DIAGNOSIS — N138 Other obstructive and reflux uropathy: Secondary | ICD-10-CM

## 2023-04-10 ENCOUNTER — Ambulatory Visit
Admission: RE | Admit: 2023-04-10 | Discharge: 2023-04-10 | Disposition: A | Discharge status: Home / Self Care | Payer: Medicare Other | Source: Ambulatory Visit | Attending: Orthopedic Surgery | Admitting: Orthopedic Surgery

## 2023-04-10 DIAGNOSIS — M4316 Spondylolisthesis, lumbar region: Secondary | ICD-10-CM | POA: Diagnosis not present

## 2023-04-10 DIAGNOSIS — M48061 Spinal stenosis, lumbar region without neurogenic claudication: Secondary | ICD-10-CM | POA: Diagnosis not present

## 2023-04-10 DIAGNOSIS — M419 Scoliosis, unspecified: Secondary | ICD-10-CM | POA: Diagnosis not present

## 2023-04-10 DIAGNOSIS — M5416 Radiculopathy, lumbar region: Secondary | ICD-10-CM

## 2023-04-10 NOTE — Telephone Encounter (Signed)
 Requested by interface surescripts. Future visit in 2 months.  Requested Prescriptions  Pending Prescriptions Disp Refills   finasteride (PROSCAR) 5 MG tablet [Pharmacy Med Name: FINASTERIDE 5 MG TAB] 90 tablet 0    Sig: TAKE 1 TABLET BY MOUTH DAILY.     Urology: 5-alpha Reductase Inhibitors Passed - 04/10/2023 12:25 PM      Passed - PSA in normal range and within 360 days    PSA  Date Value Ref Range Status  09/19/2022 0.17 < OR = 4.00 ng/mL Final    Comment:    The total PSA value from this assay system is  standardized against the WHO standard. The test  result will be approximately 20% lower when compared  to the equimolar-standardized total PSA (Beckman  Coulter). Comparison of serial PSA results should be  interpreted with this fact in mind. . This test was performed using the Siemens  chemiluminescent method. Values obtained from  different assay methods cannot be used interchangeably. PSA levels, regardless of value, should not be interpreted as absolute evidence of the presence or absence of disease.          Passed - Valid encounter within last 12 months    Recent Outpatient Visits           6 months ago Type 2 diabetes mellitus with hyperglycemia, without long-term current use of insulin [facility])   [place] [facility] Margarita Mail, DO   1 year ago Type 2 diabetes mellitus with hyperglycemia, without long-term current use of insulin [facility])   [place] [facility] Margarita Mail, DO   1 year ago Pre-diabetes   [facility] Margarita Mail, DO   2 years ago Pre-diabetes   [facility] Margarita Mail, DO   2 years ago Pre-diabetes   [facility] Caro Laroche, DO       Future Appointments             In 2 months Margarita Mail, DO [facility], [facility]

## 2023-04-16 ENCOUNTER — Other Ambulatory Visit: Payer: Self-pay | Admitting: Orthopedic Surgery

## 2023-04-17 ENCOUNTER — Encounter (HOSPITAL_COMMUNITY): Payer: Self-pay

## 2023-04-17 NOTE — Progress Notes (Signed)
 Surgical Instructions   Your procedure is scheduled on Wednesday April 22, 2023. Report to [facility] Main Entrance "A" at 6:30 A.M., then check in with the Admitting office. Any questions or running late day of surgery: call [phone]  Questions prior to your surgery date: call [phone], Monday-Friday, 8am-4pm. If you experience any cold or flu symptoms such as cough, fever, chills, shortness of breath, etc. between now and your scheduled surgery, please notify us at the above number.     Remember:  Do not eat after midnight the night before your surgery  You may drink clear liquids until 5:30 the morning of your surgery.   Clear liquids allowed are: Water, Non-Citrus Juices (without pulp), Carbonated Beverages, Clear Tea (no milk, honey, etc.), Black Coffee Only (NO MILK, CREAM OR POWDERED CREAMER of any kind), and Gatorade.  Patient Instructions  The night before surgery:  No food after midnight. ONLY clear liquids after midnight  The day of surgery (if you have diabetes): Drink ONE (1) 12 oz G2 given to you in your pre admission testing appointment by 5:30 the morning of surgery. Drink in one sitting. Do not sip.  This drink was given to you during your hospital  pre-op appointment visit.  Nothing else to drink after completing the  12 oz bottle of G2.         If you have questions, please contact your surgeon's office.    Take these medicines the morning of surgery with A SIP OF WATER  famotidine (PEPCID)  finasteride (PROSCAR)  fluticasone (FLONASE) 50 MCG/ACT nasal spray  loratadine (CLARITIN)  tamsulosin [facility])   May take these medicines IF NEEDED: Carboxymeth-Glyc-Polysorb PF (REFRESH OPTIVE MEGA-3) 0.5-1-0.5 % SOLN   Follow your surgeon's instructions on when to stop Asprin.  If no instructions were given by your surgeon then you will need to call the office to get those instructions.     One week prior to surgery, STOP taking any Aleve, Naproxen,  Ibuprofen, Motrin, Advil, Goody's, BC's, all herbal medications, fish oil, and non-prescription vitamins.  This includes your meloxicam (MOBIC).    WHAT DO I DO ABOUT MY DIABETES MEDICATION?   Do not take oral diabetes medicines (pills) the morning of surgery.        DO NOT TAKE YOURSemaglutide (RYBELSUS) 24 HOURS PRIOR TO SURGERY WITH THE LAST DOSE BEING 04/20/2023.   The day of surgery, do not take other diabetes injectables, including Byetta (exenatide), Bydureon (exenatide ER), Victoza (liraglutide), or Trulicity (dulaglutide).  If your CBG is greater than 220 mg/dL, you may take  of your sliding scale (correction) dose of insulin.   HOW TO MANAGE YOUR DIABETES BEFORE AND AFTER SURGERY  Why is it important to control my blood sugar before and after surgery? Improving blood sugar levels before and after surgery helps healing and can limit problems. A way of improving blood sugar control is eating a healthy diet by:  Eating less sugar and carbohydrates  Increasing activity/exercise  Talking with your doctor about reaching your blood sugar goals High blood sugars (greater than 180 mg/dL) can raise your risk of infections and slow your recovery, so you will need to focus on controlling your diabetes during the weeks before surgery. Make sure that the doctor who takes care of your diabetes knows about your planned surgery including the date and location.  How do I manage my blood sugar before surgery? Check your blood sugar at least 4 times a day, starting 2 days before surgery,  to make sure that the level is not too high or low.  Check your blood sugar the morning of your surgery when you wake up and every 2 hours until you get to the Short Stay unit.  If your blood sugar is less than 70 mg/dL, you will need to treat for low blood sugar: Do not take insulin. Treat a low blood sugar (less than 70 mg/dL) with  cup of clear juice (cranberry or apple), 4 glucose tablets, OR glucose  gel. Recheck blood sugar in 15 minutes after treatment (to make sure it is greater than 70 mg/dL). If your blood sugar is not greater than 70 mg/dL on recheck, call [phone] for further instructions. Report your blood sugar to the short stay nurse when you get to Short Stay.  If you are admitted to the hospital after surgery: Your blood sugar will be checked by the staff and you will probably be given insulin after surgery (instead of oral diabetes medicines) to make sure you have good blood sugar levels. The goal for blood sugar control after surgery is 80-180 mg/dL.                       Do NOT Smoke (Tobacco/Vaping) for 24 hours prior to your procedure.  If you use a CPAP at night, you may bring your mask/headgear for your overnight stay.   You will be asked to remove any contacts, glasses, piercing's, hearing aid's, dentures/partials prior to surgery. Please bring cases for these items if needed.    Patients discharged the day of surgery will not be allowed to drive home, and someone needs to stay with them for 24 hours.  SURGICAL WAITING ROOM VISITATION Patients may have no more than 2 support people in the waiting area - these visitors may rotate.   Pre-op nurse will coordinate an appropriate time for 1 ADULT support person, who may not rotate, to accompany patient in pre-op.  Children under the age of 11 must have an adult with them who is not the patient and must remain in the main waiting area with an adult.  If the patient needs to stay at the hospital during part of their recovery, the visitor guidelines for inpatient rooms apply.  Please refer to the [facility] website for the visitor guidelines for any additional information.   If you received a COVID test during your pre-op visit  it is requested that you wear a mask when out in public, stay away from anyone that may not be feeling well and notify your surgeon if you develop symptoms. If you have been in contact with  anyone that has tested positive in the last 10 days please notify you surgeon.      Pre-operative 5 CHG Bathing Instructions   You can play a key role in reducing the risk of infection after surgery. Your skin needs to be as free of germs as possible. You can reduce the number of germs on your skin by washing with CHG (chlorhexidine gluconate) soap before surgery. CHG is an antiseptic soap that kills germs and continues to kill germs even after washing.   DO NOT use if you have an allergy to chlorhexidine/CHG or antibacterial soaps. If your skin becomes reddened or irritated, stop using the CHG and notify one of our RNs at [phone].   Please shower with the CHG soap starting 4 days before surgery using the following schedule:     Please keep in mind the  following:  DO NOT shave, including legs and underarms, starting the day of your first shower.   You may shave your face at any point before/day of surgery.  Place clean sheets on your bed the day you start using CHG soap. Use a clean washcloth (not used since being washed) for each shower. DO NOT sleep with pets once you start using the CHG.   CHG Shower Instructions:  Wash your face and private area with normal soap. If you choose to wash your hair, wash first with your normal shampoo.  After you use shampoo/soap, rinse your hair and body thoroughly to remove shampoo/soap residue.  Turn the water OFF and apply about 3 tablespoons (45 ml) of CHG soap to a CLEAN washcloth.  Apply CHG soap ONLY FROM YOUR NECK DOWN TO YOUR TOES (washing for 3-5 minutes)  DO NOT use CHG soap on face, private areas, open wounds, or sores.  Pay special attention to the area where your surgery is being performed.  If you are having back surgery, having someone wash your back for you may be helpful. Wait 2 minutes after CHG soap is applied, then you may rinse off the CHG soap.  Pat dry with a clean towel  Put on clean clothes/pajamas   If you choose to  wear lotion, please use ONLY the CHG-compatible lotions that are listed below.  Additional instructions for the day of surgery: DO NOT APPLY any lotions, deodorants cologne   Do not bring valuables to the hospital. [facility] is not responsible for any belongings/valuables. Do not wear jewelry Put on clean/comfortable clothes.  Please brush your teeth.  Ask your nurse before applying any prescription medications to the skin.     CHG Compatible Lotions   Aveeno Moisturizing lotion  Cetaphil Moisturizing Cream  Cetaphil Moisturizing Lotion  Clairol Herbal Essence Moisturizing Lotion, Dry Skin  Clairol Herbal Essence Moisturizing Lotion, Extra Dry Skin  Clairol Herbal Essence Moisturizing Lotion, Normal Skin  Curel Age Defying Therapeutic Moisturizing Lotion with Alpha Hydroxy  Curel Extreme Care Body Lotion  Curel Soothing Hands Moisturizing Hand Lotion  Curel Therapeutic Moisturizing Cream, Fragrance-Free  Curel Therapeutic Moisturizing Lotion, Fragrance-Free  Curel Therapeutic Moisturizing Lotion, Original Formula  Eucerin Daily Replenishing Lotion  Eucerin Dry Skin Therapy Plus Alpha Hydroxy Crme  Eucerin Dry Skin Therapy Plus Alpha Hydroxy Lotion  Eucerin Original Crme  Eucerin Original Lotion  Eucerin Plus Crme Eucerin Plus Lotion  Eucerin TriLipid Replenishing Lotion  Keri Anti-Bacterial Hand Lotion  Keri Deep Conditioning Original Lotion Dry Skin Formula Softly Scented  Keri Deep Conditioning Original Lotion, Fragrance Free Sensitive Skin Formula  Keri Lotion Fast Absorbing Fragrance Free Sensitive Skin Formula  Keri Lotion Fast Absorbing Softly Scented Dry Skin Formula  Keri Original Lotion  Keri Skin Renewal Lotion Keri Silky Smooth Lotion  Keri Silky Smooth Sensitive Skin Lotion  Nivea Body Creamy Conditioning Oil  Nivea Body Extra Enriched Lotion  Nivea Body Original Lotion  Nivea Body Sheer Moisturizing Lotion Nivea Crme  Nivea Skin Firming Lotion   NutraDerm 30 Skin Lotion  NutraDerm Skin Lotion  NutraDerm Therapeutic Skin Cream  NutraDerm Therapeutic Skin Lotion  ProShield Protective Hand Cream  Provon moisturizing lotion  Please read over the following fact sheets that you were given.

## 2023-04-19 ENCOUNTER — Ambulatory Visit
Admission: RE | Admit: 2023-04-19 | Discharge: 2023-04-19 | Disposition: A | Discharge status: Home / Self Care | Payer: Medicare Other | Source: Ambulatory Visit | Attending: Orthopedic Surgery | Admitting: Orthopedic Surgery

## 2023-04-19 DIAGNOSIS — M5416 Radiculopathy, lumbar region: Secondary | ICD-10-CM

## 2023-04-19 DIAGNOSIS — M4316 Spondylolisthesis, lumbar region: Secondary | ICD-10-CM | POA: Diagnosis not present

## 2023-04-19 DIAGNOSIS — M419 Scoliosis, unspecified: Secondary | ICD-10-CM | POA: Diagnosis not present

## 2023-04-19 DIAGNOSIS — M48061 Spinal stenosis, lumbar region without neurogenic claudication: Secondary | ICD-10-CM | POA: Diagnosis not present

## 2023-04-20 ENCOUNTER — Encounter (HOSPITAL_COMMUNITY)
Admission: RE | Admit: 2023-04-20 | Discharge: 2023-04-20 | Disposition: A | Discharge status: Home / Self Care | Source: Ambulatory Visit | Attending: Orthopedic Surgery | Admitting: Orthopedic Surgery

## 2023-04-20 ENCOUNTER — Other Ambulatory Visit: Payer: Self-pay

## 2023-04-20 ENCOUNTER — Encounter (HOSPITAL_COMMUNITY): Payer: Self-pay

## 2023-04-20 VITALS — BP 136/96 | HR 114 | Temp 98.5°F | Resp 17

## 2023-04-20 DIAGNOSIS — Z01818 Encounter for other preprocedural examination: Secondary | ICD-10-CM | POA: Diagnosis not present

## 2023-04-20 DIAGNOSIS — Z7902 Long term (current) use of antithrombotics/antiplatelets: Secondary | ICD-10-CM | POA: Diagnosis not present

## 2023-04-20 DIAGNOSIS — Z79899 Other long term (current) drug therapy: Secondary | ICD-10-CM | POA: Diagnosis not present

## 2023-04-20 DIAGNOSIS — Z791 Long term (current) use of non-steroidal anti-inflammatories (NSAID): Secondary | ICD-10-CM | POA: Diagnosis not present

## 2023-04-20 DIAGNOSIS — Z7982 Long term (current) use of aspirin: Secondary | ICD-10-CM | POA: Diagnosis not present

## 2023-04-20 DIAGNOSIS — M5416 Radiculopathy, lumbar region: Secondary | ICD-10-CM | POA: Diagnosis not present

## 2023-04-20 DIAGNOSIS — Z9049 Acquired absence of other specified parts of digestive tract: Secondary | ICD-10-CM | POA: Diagnosis not present

## 2023-04-20 DIAGNOSIS — E1165 Type 2 diabetes mellitus with hyperglycemia: Secondary | ICD-10-CM | POA: Insufficient documentation

## 2023-04-20 DIAGNOSIS — M48061 Spinal stenosis, lumbar region without neurogenic claudication: Secondary | ICD-10-CM | POA: Diagnosis not present

## 2023-04-20 DIAGNOSIS — E785 Hyperlipidemia, unspecified: Secondary | ICD-10-CM | POA: Diagnosis not present

## 2023-04-20 DIAGNOSIS — Z87891 Personal history of nicotine dependence: Secondary | ICD-10-CM | POA: Diagnosis not present

## 2023-04-20 DIAGNOSIS — Z981 Arthrodesis status: Secondary | ICD-10-CM | POA: Diagnosis not present

## 2023-04-20 DIAGNOSIS — Z8042 Family history of malignant neoplasm of prostate: Secondary | ICD-10-CM | POA: Diagnosis not present

## 2023-04-20 DIAGNOSIS — E119 Type 2 diabetes mellitus without complications: Secondary | ICD-10-CM | POA: Diagnosis not present

## 2023-04-20 DIAGNOSIS — Z8249 Family history of ischemic heart disease and other diseases of the circulatory system: Secondary | ICD-10-CM | POA: Diagnosis not present

## 2023-04-20 DIAGNOSIS — Z820 Family history of epilepsy and other diseases of the nervous system: Secondary | ICD-10-CM | POA: Diagnosis not present

## 2023-04-20 DIAGNOSIS — Z96652 Presence of left artificial knee joint: Secondary | ICD-10-CM | POA: Diagnosis not present

## 2023-04-20 DIAGNOSIS — K219 Gastro-esophageal reflux disease without esophagitis: Secondary | ICD-10-CM | POA: Diagnosis not present

## 2023-04-20 HISTORY — DX: Type 2 diabetes mellitus without complications: E11.9

## 2023-04-20 LAB — BASIC METABOLIC PANEL
Anion gap: 10 (ref 5–15)
BUN: 13 mg/dL (ref 8–23)
CO2: 24 mmol/L (ref 22–32)
Calcium: 9.4 mg/dL (ref 8.9–10.3)
Chloride: 107 mmol/L (ref 98–111)
Creatinine, Ser: 0.91 mg/dL (ref 0.61–1.24)
GFR, Estimated: >60 mL/min (ref >60)
Glucose, Bld: 146 mg/dL — ABNORMAL HIGH (ref 70–99)
Potassium: 3.9 mmol/L (ref 3.5–5.1)
Sodium: 141 mmol/L (ref 135–145)

## 2023-04-20 LAB — CBC
HCT: 42.7 % (ref 39.0–52.0)
Hemoglobin: 14.4 g/dL (ref 13.0–17.0)
MCH: 31.2 pg (ref 26.0–34.0)
MCHC: 33.7 g/dL (ref 30.0–36.0)
MCV: 92.4 fL (ref 80.0–100.0)
Platelets: 230 10*3/uL (ref 150–400)
RBC: 4.62 MIL/uL (ref 4.22–5.81)
RDW: 13.7 % (ref 11.5–15.5)
WBC: 9.5 10*3/uL (ref 4.0–10.5)
nRBC: 0 % (ref 0.0–0.2)

## 2023-04-20 LAB — SURGICAL PCR SCREEN
MRSA, PCR: NEGATIVE
Staphylococcus aureus: NEGATIVE

## 2023-04-20 LAB — TYPE AND SCREEN
ABO/RH(D): A POS
Antibody Screen: NEGATIVE

## 2023-04-20 LAB — GLUCOSE, CAPILLARY: Glucose-Capillary: 181 mg/dL — ABNORMAL HIGH (ref 70–99)

## 2023-04-20 NOTE — Progress Notes (Signed)
 PCP - Dr. Margarita Mail Cardiologist -   PPM/ICD - denies Device Orders - na Rep Notified - na  Chest x-ray - na EKG - PAT, 04/20/2023 Stress Test -  ECHO -  Cardiac Cath -   Sleep Study -  denies CPAP - na  Type II diabetic. Blood sugar 181 at PAT Fasting Blood Sugar - Doesn't check blood sugar Checks Blood Sugar: doesn't check blood sugar  Last dose of GLP1 agonist-  Semaglutide (Rybelsus), takes daily, instructed to hold 24 hours prior to surgery with last dose being 04/20/2023  Blood Thinner Instructions: denies Aspirin Instructions: Follow surgeon's instructions  ERAS Protcol - G2 until 0530  Anesthesia review: No  Patient denies shortness of breath, fever, cough and chest pain at PAT appointment   All instructions explained to the patient, with a verbal understanding of the material. Patient agrees to go over the instructions while at home for a better understanding. Patient also instructed to self quarantine after being tested for COVID-19. The opportunity to ask questions was provided.

## 2023-04-21 NOTE — Anesthesia Preprocedure Evaluation (Signed)
 Anesthesia Evaluation  Patient identified by MRN, date of birth, ID band Patient awake    Reviewed: Allergy & Precautions, H&P , NPO status , Patient's Chart, lab work & pertinent test results  Airway Mallampati: II  TM Distance: >3 FB Neck ROM: Full    Dental no notable dental hx. (+) Teeth Intact, Dental Advisory Given   Pulmonary former smoker   Pulmonary exam normal breath sounds clear to auscultation       Cardiovascular Exercise Tolerance: Good Normal cardiovascular exam Rhythm:Regular Rate:Normal     Neuro/Psych negative neurological ROS  negative psych ROS   GI/Hepatic Neg liver ROS,GERD  Medicated,,  Endo/Other  negative endocrine ROSdiabetes, Type 2    Renal/GU negative Renal ROS  negative genitourinary   Musculoskeletal negative musculoskeletal ROS (+) Arthritis , Osteoarthritis,    Abdominal   Peds negative pediatric ROS (+)  Hematology negative hematology ROS (+)   Anesthesia Other Findings   Reproductive/Obstetrics negative OB ROS                              Anesthesia Physical Anesthesia Plan  ASA: 2  Anesthesia Plan: General   Post-op Pain Management: Ofirmev IV (intra-op)*   Induction:   PONV Risk Score and Plan: 2 and Ondansetron, Dexamethasone and Treatment may vary due to age or medical condition  Airway Management Planned: Oral ETT  Additional Equipment:   Intra-op Plan:   Post-operative Plan: Extubation in OR  Informed Consent: I have reviewed the patients History and Physical, chart, labs and discussed the procedure including the risks, benefits and alternatives for the proposed anesthesia with the patient or authorized representative who has indicated his/her understanding and acceptance.       Plan Discussed with: Anesthesiologist, CRNA and Surgeon  Anesthesia Plan Comments: (  )         Anesthesia Quick Evaluation

## 2023-04-22 ENCOUNTER — Inpatient Hospital Stay (HOSPITAL_COMMUNITY): Payer: Self-pay

## 2023-04-22 ENCOUNTER — Other Ambulatory Visit: Payer: Self-pay

## 2023-04-22 ENCOUNTER — Encounter (HOSPITAL_COMMUNITY): Payer: Self-pay | Admitting: Orthopedic Surgery

## 2023-04-22 ENCOUNTER — Encounter (HOSPITAL_COMMUNITY)
Admission: RE | Disposition: A | Discharge status: Home / Self Care | Payer: Self-pay | Source: Home / Self Care | Attending: Orthopedic Surgery

## 2023-04-22 ENCOUNTER — Inpatient Hospital Stay (HOSPITAL_COMMUNITY)

## 2023-04-22 ENCOUNTER — Inpatient Hospital Stay (HOSPITAL_COMMUNITY)
Admission: RE | Admit: 2023-04-22 | Discharge: 2023-04-23 | DRG: 402 | Disposition: A | Discharge status: Home / Self Care | Attending: Orthopedic Surgery | Admitting: Orthopedic Surgery

## 2023-04-22 DIAGNOSIS — Z79899 Other long term (current) drug therapy: Secondary | ICD-10-CM

## 2023-04-22 DIAGNOSIS — Z87891 Personal history of nicotine dependence: Secondary | ICD-10-CM | POA: Diagnosis not present

## 2023-04-22 DIAGNOSIS — E119 Type 2 diabetes mellitus without complications: Secondary | ICD-10-CM | POA: Diagnosis present

## 2023-04-22 DIAGNOSIS — Z7902 Long term (current) use of antithrombotics/antiplatelets: Secondary | ICD-10-CM

## 2023-04-22 DIAGNOSIS — Z791 Long term (current) use of non-steroidal anti-inflammatories (NSAID): Secondary | ICD-10-CM

## 2023-04-22 DIAGNOSIS — Z01818 Encounter for other preprocedural examination: Secondary | ICD-10-CM

## 2023-04-22 DIAGNOSIS — Z8042 Family history of malignant neoplasm of prostate: Secondary | ICD-10-CM

## 2023-04-22 DIAGNOSIS — M5416 Radiculopathy, lumbar region: Secondary | ICD-10-CM

## 2023-04-22 DIAGNOSIS — K219 Gastro-esophageal reflux disease without esophagitis: Secondary | ICD-10-CM | POA: Diagnosis present

## 2023-04-22 DIAGNOSIS — E785 Hyperlipidemia, unspecified: Secondary | ICD-10-CM | POA: Diagnosis present

## 2023-04-22 DIAGNOSIS — M48061 Spinal stenosis, lumbar region without neurogenic claudication: Secondary | ICD-10-CM | POA: Diagnosis present

## 2023-04-22 DIAGNOSIS — Z9049 Acquired absence of other specified parts of digestive tract: Secondary | ICD-10-CM

## 2023-04-22 DIAGNOSIS — Z820 Family history of epilepsy and other diseases of the nervous system: Secondary | ICD-10-CM

## 2023-04-22 DIAGNOSIS — Z8249 Family history of ischemic heart disease and other diseases of the circulatory system: Secondary | ICD-10-CM | POA: Diagnosis not present

## 2023-04-22 DIAGNOSIS — Z7982 Long term (current) use of aspirin: Secondary | ICD-10-CM

## 2023-04-22 DIAGNOSIS — Z96652 Presence of left artificial knee joint: Secondary | ICD-10-CM | POA: Diagnosis present

## 2023-04-22 DIAGNOSIS — M4800 Spinal stenosis, site unspecified: Principal | ICD-10-CM | POA: Diagnosis present

## 2023-04-22 DIAGNOSIS — E1165 Type 2 diabetes mellitus with hyperglycemia: Secondary | ICD-10-CM

## 2023-04-22 DIAGNOSIS — Z981 Arthrodesis status: Secondary | ICD-10-CM | POA: Diagnosis not present

## 2023-04-22 HISTORY — PX: ANTERIOR LAT LUMBAR FUSION: SHX1168

## 2023-04-22 LAB — GLUCOSE, CAPILLARY
Glucose-Capillary: 124 mg/dL — ABNORMAL HIGH (ref 70–99)
Glucose-Capillary: 153 mg/dL — ABNORMAL HIGH (ref 70–99)
Glucose-Capillary: 133 mg/dL — ABNORMAL HIGH (ref 70–99)
Glucose-Capillary: 138 mg/dL — ABNORMAL HIGH (ref 70–99)
Glucose-Capillary: 149 mg/dL — ABNORMAL HIGH (ref 70–99)

## 2023-04-22 LAB — ABO/RH: ABO/RH(D): A POS

## 2023-04-22 SURGERY — ANTERIOR LATERAL LUMBAR FUSION 1 LEVEL
Anesthesia: General | Site: Spine Lumbar

## 2023-04-22 MED ORDER — ALUM & MAG HYDROXIDE-SIMETH 200-200-20 MG/5ML PO SUSP: 30.0000 mL | Freq: Four times a day (QID) | ORAL | Status: DC | PRN

## 2023-04-22 MED ORDER — CHLORHEXIDINE GLUCONATE 0.12 % MT SOLN
15.0000 mL | Freq: Once | OROMUCOSAL | Status: AC
  Administered 2023-04-22: 15 mL via OROMUCOSAL
  Filled 2023-04-22: qty 15

## 2023-04-22 MED ORDER — FLEET ENEMA RE ENEM: 1.0000 | ENEMA | Freq: Once | RECTAL | Status: DC | PRN

## 2023-04-22 MED ORDER — ROCURONIUM BROMIDE 10 MG/ML (PF) SYRINGE
PREFILLED_SYRINGE | INTRAVENOUS | Status: AC
  Filled 2023-04-22: qty 10

## 2023-04-22 MED ORDER — EPHEDRINE SULFATE-NACL 50-0.9 MG/10ML-% IV SOSY
PREFILLED_SYRINGE | INTRAVENOUS | Status: DC | PRN
  Administered 2023-04-22: 10 mg via INTRAVENOUS
  Administered 2023-04-22 (×2): 15 mg via INTRAVENOUS

## 2023-04-22 MED ORDER — POTASSIUM CHLORIDE IN NACL 20-0.9 MEQ/L-% IV SOLN
INTRAVENOUS | Status: DC
  Filled 2023-04-22: qty 1000

## 2023-04-22 MED ORDER — BUPIVACAINE-EPINEPHRINE 0.25% -1:200000 IJ SOLN
INTRAMUSCULAR | Status: DC | PRN
  Administered 2023-04-22 (×2): 9 mL

## 2023-04-22 MED ORDER — MIDAZOLAM HCL 2 MG/2ML IJ SOLN
INTRAMUSCULAR | Status: DC | PRN
  Administered 2023-04-22: 2 mg via INTRAVENOUS

## 2023-04-22 MED ORDER — BUPIVACAINE-EPINEPHRINE (PF) 0.25% -1:200000 IJ SOLN
INTRAMUSCULAR | Status: AC
  Filled 2023-04-22: qty 30

## 2023-04-22 MED ORDER — ACETAMINOPHEN 325 MG PO TABS: 650.0000 mg | ORAL_TABLET | ORAL | Status: DC | PRN

## 2023-04-22 MED ORDER — METHOCARBAMOL 500 MG PO TABS
500.0000 mg | ORAL_TABLET | Freq: Four times a day (QID) | ORAL | Status: DC | PRN
  Administered 2023-04-22 – 2023-04-23 (×3): 500 mg via ORAL
  Filled 2023-04-22 (×3): qty 1

## 2023-04-22 MED ORDER — OXYCODONE HCL 5 MG PO TABS: 5.0000 mg | ORAL_TABLET | Freq: Once | ORAL | Status: DC | PRN

## 2023-04-22 MED ORDER — ONDANSETRON HCL 4 MG/2ML IJ SOLN
INTRAMUSCULAR | Status: DC | PRN
  Administered 2023-04-22: 4 mg via INTRAVENOUS

## 2023-04-22 MED ORDER — ONDANSETRON HCL 4 MG/2ML IJ SOLN
INTRAMUSCULAR | Status: AC
  Filled 2023-04-22: qty 2

## 2023-04-22 MED ORDER — TAMSULOSIN HCL 0.4 MG PO CAPS
0.4000 mg | ORAL_CAPSULE | Freq: Every day | ORAL | Status: DC
  Administered 2023-04-22: 0.4 mg via ORAL
  Filled 2023-04-22: qty 1

## 2023-04-22 MED ORDER — MIDAZOLAM HCL 2 MG/2ML IJ SOLN
INTRAMUSCULAR | Status: AC
Start: 2023-04-22 — End: ?
  Filled 2023-04-22: qty 2

## 2023-04-22 MED ORDER — LACTATED RINGERS IV SOLN: INTRAVENOUS | Status: DC | PRN

## 2023-04-22 MED ORDER — MORPHINE SULFATE (PF) 2 MG/ML IV SOLN: 2.0000 mg | INTRAVENOUS | Status: DC | PRN

## 2023-04-22 MED ORDER — PHENOL 1.4 % MT LIQD: 1.0000 | OROMUCOSAL | Status: DC | PRN

## 2023-04-22 MED ORDER — LORATADINE 10 MG PO TABS
10.0000 mg | ORAL_TABLET | Freq: Two times a day (BID) | ORAL | Status: DC
  Administered 2023-04-22: 10 mg via ORAL
  Filled 2023-04-22: qty 1

## 2023-04-22 MED ORDER — CEFAZOLIN SODIUM-DEXTROSE 2-4 GM/100ML-% IV SOLN
2.0000 g | INTRAVENOUS | Status: AC
  Administered 2023-04-22: 2 g via INTRAVENOUS
  Filled 2023-04-22: qty 100

## 2023-04-22 MED ORDER — SODIUM CHLORIDE 0.9% FLUSH
3.0000 mL | Freq: Two times a day (BID) | INTRAVENOUS | Status: DC
  Administered 2023-04-22 (×2): 3 mL via INTRAVENOUS

## 2023-04-22 MED ORDER — HYDROMORPHONE HCL 1 MG/ML IJ SOLN
INTRAMUSCULAR | Status: AC
  Filled 2023-04-22: qty 0.5

## 2023-04-22 MED ORDER — BISACODYL 5 MG PO TBEC: 5.0000 mg | DELAYED_RELEASE_TABLET | Freq: Every day | ORAL | Status: DC | PRN

## 2023-04-22 MED ORDER — HYDROMORPHONE HCL 1 MG/ML IJ SOLN
INTRAMUSCULAR | Status: DC | PRN
  Administered 2023-04-22: .5 mg via INTRAVENOUS

## 2023-04-22 MED ORDER — THROMBIN 20000 UNITS EX SOLR
CUTANEOUS | Status: DC | PRN
  Administered 2023-04-22: 20 mL via TOPICAL

## 2023-04-22 MED ORDER — ACETAMINOPHEN 10 MG/ML IV SOLN
1000.0000 mg | Freq: Once | INTRAVENOUS | Status: DC | PRN
  Administered 2023-04-22: 1000 mg via INTRAVENOUS

## 2023-04-22 MED ORDER — INSULIN ASPART 100 UNIT/ML IJ SOLN
0.0000 [IU] | INTRAMUSCULAR | Status: DC | PRN
  Administered 2023-04-22: 2 [IU] via SUBCUTANEOUS

## 2023-04-22 MED ORDER — POTASSIUM 99 MG PO TABS: 99.0000 mg | ORAL_TABLET | Freq: Every morning | ORAL | Status: DC

## 2023-04-22 MED ORDER — FENTANYL CITRATE (PF) 100 MCG/2ML IJ SOLN
INTRAMUSCULAR | Status: AC
  Filled 2023-04-22: qty 2

## 2023-04-22 MED ORDER — SODIUM CHLORIDE 0.9 % IV SOLN: 250.0000 mL | INTRAVENOUS | Status: DC

## 2023-04-22 MED ORDER — ORAL CARE MOUTH RINSE: 15.0000 mL | Freq: Once | OROMUCOSAL | Status: AC

## 2023-04-22 MED ORDER — BUPIVACAINE LIPOSOME 1.3 % IJ SUSP
INTRAMUSCULAR | Status: AC
  Filled 2023-04-22: qty 20

## 2023-04-22 MED ORDER — OXYCODONE-ACETAMINOPHEN 5-325 MG PO TABS
1.0000 | ORAL_TABLET | ORAL | Status: DC | PRN
  Administered 2023-04-22: 1 via ORAL
  Administered 2023-04-22: 2 via ORAL
  Administered 2023-04-23: 1 via ORAL
  Filled 2023-04-22 (×2): qty 1
  Filled 2023-04-22: qty 2

## 2023-04-22 MED ORDER — ACETAMINOPHEN 10 MG/ML IV SOLN
INTRAVENOUS | Status: AC
Start: 2023-04-22 — End: 2023-04-23
  Filled 2023-04-22: qty 100

## 2023-04-22 MED ORDER — SIMVASTATIN 20 MG PO TABS
40.0000 mg | ORAL_TABLET | Freq: Every day | ORAL | Status: DC
  Administered 2023-04-22: 40 mg via ORAL
  Filled 2023-04-22: qty 2

## 2023-04-22 MED ORDER — ASPIRIN 81 MG PO CHEW: 81.0000 mg | CHEWABLE_TABLET | Freq: Every day | ORAL | Status: DC

## 2023-04-22 MED ORDER — SEMAGLUTIDE 3 MG PO TABS: 3.0000 mg | ORAL_TABLET | Freq: Every day | ORAL | Status: DC

## 2023-04-22 MED ORDER — METHOCARBAMOL 1000 MG/10ML IJ SOLN: 500.0000 mg | Freq: Four times a day (QID) | INTRAMUSCULAR | Status: DC | PRN

## 2023-04-22 MED ORDER — SODIUM CHLORIDE 0.9% FLUSH: 3.0000 mL | INTRAVENOUS | Status: DC | PRN

## 2023-04-22 MED ORDER — THROMBIN 20000 UNITS EX SOLR
CUTANEOUS | Status: AC
  Filled 2023-04-22: qty 20000

## 2023-04-22 MED ORDER — ZOLPIDEM TARTRATE 5 MG PO TABS: 5.0000 mg | ORAL_TABLET | Freq: Every evening | ORAL | Status: DC | PRN

## 2023-04-22 MED ORDER — DROPERIDOL 2.5 MG/ML IJ SOLN: 0.6250 mg | Freq: Once | INTRAMUSCULAR | Status: DC | PRN

## 2023-04-22 MED ORDER — POVIDONE-IODINE 7.5 % EX SOLN
Freq: Once | CUTANEOUS | Status: DC
  Filled 2023-04-22: qty 118

## 2023-04-22 MED ORDER — METHOCARBAMOL 500 MG PO TABS: 500.0000 mg | ORAL_TABLET | Freq: Four times a day (QID) | ORAL | 2 refills | Status: DC | PRN

## 2023-04-22 MED ORDER — OXYCODONE HCL 5 MG/5ML PO SOLN: 5.0000 mg | Freq: Once | ORAL | Status: DC | PRN

## 2023-04-22 MED ORDER — PHENYLEPHRINE 80 MCG/ML (10ML) SYRINGE FOR IV PUSH (FOR BLOOD PRESSURE SUPPORT)
PREFILLED_SYRINGE | INTRAVENOUS | Status: DC | PRN
  Administered 2023-04-22: 80 ug via INTRAVENOUS
  Administered 2023-04-22 (×3): 160 ug via INTRAVENOUS
  Administered 2023-04-22: 80 ug via INTRAVENOUS

## 2023-04-22 MED ORDER — FAMOTIDINE 20 MG PO TABS
20.0000 mg | ORAL_TABLET | Freq: Two times a day (BID) | ORAL | Status: DC
  Administered 2023-04-22: 20 mg via ORAL
  Filled 2023-04-22: qty 1

## 2023-04-22 MED ORDER — SENNOSIDES-DOCUSATE SODIUM 8.6-50 MG PO TABS
1.0000 | ORAL_TABLET | Freq: Every evening | ORAL | Status: DC | PRN
  Administered 2023-04-22: 1 via ORAL
  Filled 2023-04-22: qty 1

## 2023-04-22 MED ORDER — FINASTERIDE 5 MG PO TABS: 5.0000 mg | ORAL_TABLET | Freq: Every day | ORAL | Status: DC

## 2023-04-22 MED ORDER — ONDANSETRON HCL 4 MG/2ML IJ SOLN: 4.0000 mg | Freq: Four times a day (QID) | INTRAMUSCULAR | Status: DC | PRN

## 2023-04-22 MED ORDER — 0.9 % SODIUM CHLORIDE (POUR BTL) OPTIME
TOPICAL | Status: DC | PRN
  Administered 2023-04-22: 1000 mL

## 2023-04-22 MED ORDER — PROPOFOL 500 MG/50ML IV EMUL
INTRAVENOUS | Status: DC | PRN
  Administered 2023-04-22: 135 ug/kg/min via INTRAVENOUS

## 2023-04-22 MED ORDER — PROPOFOL 10 MG/ML IV BOLUS
INTRAVENOUS | Status: AC
  Filled 2023-04-22: qty 20

## 2023-04-22 MED ORDER — PROPOFOL 10 MG/ML IV BOLUS
INTRAVENOUS | Status: DC | PRN
Start: 2023-04-22 — End: 2023-04-22
  Administered 2023-04-22: 150 mg via INTRAVENOUS

## 2023-04-22 MED ORDER — PHENYLEPHRINE HCL-NACL 20-0.9 MG/250ML-% IV SOLN
INTRAVENOUS | Status: DC | PRN
  Administered 2023-04-22: 40 ug/min via INTRAVENOUS

## 2023-04-22 MED ORDER — SUCCINYLCHOLINE CHLORIDE 200 MG/10ML IV SOSY
PREFILLED_SYRINGE | INTRAVENOUS | Status: DC | PRN
  Administered 2023-04-22: 100 mg via INTRAVENOUS

## 2023-04-22 MED ORDER — DOCUSATE SODIUM 100 MG PO CAPS
100.0000 mg | ORAL_CAPSULE | Freq: Two times a day (BID) | ORAL | Status: DC
  Administered 2023-04-22: 100 mg via ORAL
  Filled 2023-04-22: qty 1

## 2023-04-22 MED ORDER — TURMERIC 500 MG PO CAPS: 500.0000 mg | ORAL_CAPSULE | Freq: Every evening | ORAL | Status: DC

## 2023-04-22 MED ORDER — CEFAZOLIN SODIUM-DEXTROSE 2-4 GM/100ML-% IV SOLN
2.0000 g | Freq: Three times a day (TID) | INTRAVENOUS | Status: AC
  Administered 2023-04-22 – 2023-04-23 (×2): 2 g via INTRAVENOUS
  Filled 2023-04-22 (×2): qty 100

## 2023-04-22 MED ORDER — OXYCODONE-ACETAMINOPHEN 5-325 MG PO TABS: 1.0000 | ORAL_TABLET | ORAL | 0 refills | Status: DC | PRN

## 2023-04-22 MED ORDER — ACETAMINOPHEN 650 MG RE SUPP: 650.0000 mg | RECTAL | Status: DC | PRN

## 2023-04-22 MED ORDER — ONDANSETRON HCL 4 MG PO TABS: 4.0000 mg | ORAL_TABLET | Freq: Four times a day (QID) | ORAL | Status: DC | PRN

## 2023-04-22 MED ORDER — FENTANYL CITRATE (PF) 100 MCG/2ML IJ SOLN
25.0000 ug | INTRAMUSCULAR | Status: DC | PRN
  Administered 2023-04-22: 25 ug via INTRAVENOUS
  Administered 2023-04-22: 50 ug via INTRAVENOUS

## 2023-04-22 MED ORDER — FENTANYL CITRATE (PF) 250 MCG/5ML IJ SOLN
INTRAMUSCULAR | Status: AC
  Filled 2023-04-22: qty 5

## 2023-04-22 MED ORDER — MENTHOL 3 MG MT LOZG: 1.0000 | LOZENGE | OROMUCOSAL | Status: DC | PRN

## 2023-04-22 MED ORDER — LIDOCAINE 2% (20 MG/ML) 5 ML SYRINGE
INTRAMUSCULAR | Status: AC
  Filled 2023-04-22: qty 5

## 2023-04-22 MED ORDER — FENTANYL CITRATE (PF) 250 MCG/5ML IJ SOLN
INTRAMUSCULAR | Status: DC | PRN
  Administered 2023-04-22 (×2): 100 ug via INTRAVENOUS
  Administered 2023-04-22: 40 ug via INTRAVENOUS
  Administered 2023-04-22: 50 ug via INTRAVENOUS

## 2023-04-22 MED ORDER — LIDOCAINE 2% (20 MG/ML) 5 ML SYRINGE
INTRAMUSCULAR | Status: DC | PRN
  Administered 2023-04-22: 100 mg via INTRAVENOUS

## 2023-04-22 MED ORDER — SUGAMMADEX SODIUM 200 MG/2ML IV SOLN
INTRAVENOUS | Status: AC
  Filled 2023-04-22: qty 4

## 2023-04-22 SURGICAL SUPPLY — 97 items
BAG COUNTER SPONGE SURGICOUNT (BAG) ×4 IMPLANT
BENZOIN TINCTURE PRP APPL 2/3 (GAUZE/BANDAGES/DRESSINGS) ×2 IMPLANT
BLADE CLIPPER SURG (BLADE) IMPLANT
BLADE SURG 10 STRL SS (BLADE) ×2 IMPLANT
BONE VIVIGEN FORMABLE 10CC (Bone Implant) ×2 IMPLANT
BUR PRESCISION 1.7 ELITE (BURR) ×2 IMPLANT
BUR ROUND FLUTED 5 RND (BURR) ×2 IMPLANT
BUR ROUND PRECISION 4.0 (BURR) IMPLANT
BUR SABER RD CUTTING 3.0 (BURR) IMPLANT
CNTNR URN SCR LID CUP LEK RST (MISCELLANEOUS) ×2 IMPLANT
COVER BACK TABLE 80X110 HD (DRAPES) ×2 IMPLANT
COVER MAYO STAND STRL (DRAPES) ×4 IMPLANT
COVER SURGICAL LIGHT HANDLE (MISCELLANEOUS) ×4 IMPLANT
DRAIN CHANNEL 15F RND FF W/TCR (WOUND CARE) IMPLANT
DRAPE C-ARM 42X72 X-RAY (DRAPES) ×4 IMPLANT
DRAPE C-ARMOR (DRAPES) ×2 IMPLANT
DRAPE POUCH INSTRU U-SHP 10X18 (DRAPES) ×4 IMPLANT
DRAPE SURG 17X23 STRL (DRAPES) ×16 IMPLANT
DURAPREP 26ML APPLICATOR (WOUND CARE) ×4 IMPLANT
ELECT BLADE 4.0 EZ CLEAN MEGAD (MISCELLANEOUS) IMPLANT
ELECT BLADE 6.5 EXT (BLADE) ×2 IMPLANT
ELECT CAUTERY BLADE 6.4 (BLADE) ×4 IMPLANT
ELECT NVM5 SURFACE MEP/EMG (ELECTRODE) IMPLANT
ELECT REM PT RETURN 9FT ADLT (ELECTROSURGICAL) ×2 IMPLANT
ELECTRODE BLDE 4.0 EZ CLN MEGD (MISCELLANEOUS) ×2 IMPLANT
ELECTRODE REM PT RTRN 9FT ADLT (ELECTROSURGICAL) ×4 IMPLANT
EVACUATOR SILICONE 100CC (DRAIN) IMPLANT
FILTER STRAW FLUID ASPIR (MISCELLANEOUS) ×2 IMPLANT
GAUZE 4X4 16PLY ~~LOC~~+RFID DBL (SPONGE) ×4 IMPLANT
GAUZE SPONGE 4X4 12PLY STRL (GAUZE/BANDAGES/DRESSINGS) ×2 IMPLANT
GLOVE BIO SURGEON STRL SZ 6.5 (GLOVE) ×6 IMPLANT
GLOVE BIO SURGEON STRL SZ8 (GLOVE) ×4 IMPLANT
GLOVE BIOGEL PI IND STRL 7.0 (GLOVE) ×4 IMPLANT
GLOVE BIOGEL PI IND STRL 8 (GLOVE) ×4 IMPLANT
GLOVE SURG ENC MOIS LTX SZ6.5 (GLOVE) ×6 IMPLANT
GOWN STRL REUS W/ TWL LRG LVL3 (GOWN DISPOSABLE) ×6 IMPLANT
GOWN STRL REUS W/ TWL XL LVL3 (GOWN DISPOSABLE) ×6 IMPLANT
GRAFT BNE MATRIX VG FRMBL L 10 (Bone Implant) IMPLANT
GUIDEWIRE BLUNT VIPER II 1.45 (WIRE) IMPLANT
IV CATH 14GX2 1/4 (CATHETERS) ×4 IMPLANT
KIT ALARA NEURO ACCESS (KITS) IMPLANT
KIT BASIN OR (CUSTOM PROCEDURE TRAY) ×4 IMPLANT
KIT DILATOR XLIF 5 (KITS) IMPLANT
KIT POSITION SURG JACKSON T1 (MISCELLANEOUS) ×2 IMPLANT
KIT SURGICAL ACCESS MAXCESS (KITS) IMPLANT
KIT TURNOVER KIT B (KITS) ×4 IMPLANT
MARKER SKIN DUAL TIP RULER LAB (MISCELLANEOUS) ×6 IMPLANT
MODULE EMG NDL SSEP NVM5 (NEUROSURGERY SUPPLIES) IMPLANT
MODULE EMG NEEDLE SSEP NVM5 (NEUROSURGERY SUPPLIES) ×2 IMPLANT
NDL 18GX1X1/2 (RX/OR ONLY) (NEEDLE) ×2 IMPLANT
NDL 22X1.5 STRL (OR ONLY) (MISCELLANEOUS) ×4 IMPLANT
NDL HYPO 25GX1X1/2 BEV (NEEDLE) ×4 IMPLANT
NDL SPNL 18GX3.5 QUINCKE PK (NEEDLE) ×6 IMPLANT
NEEDLE 18GX1X1/2 (RX/OR ONLY) (NEEDLE) IMPLANT
NEEDLE 22X1.5 STRL (OR ONLY) (MISCELLANEOUS) IMPLANT
NEEDLE HYPO 25GX1X1/2 BEV (NEEDLE) ×2 IMPLANT
NEEDLE SPNL 18GX3.5 QUINCKE PK (NEEDLE) ×2 IMPLANT
NS IRRIG 1000ML POUR BTL (IV SOLUTION) ×6 IMPLANT
PACK LAMINECTOMY ORTHO (CUSTOM PROCEDURE TRAY) ×4 IMPLANT
PACK UNIVERSAL I (CUSTOM PROCEDURE TRAY) ×4 IMPLANT
PAD ARMBOARD 7.5X6 YLW CONV (MISCELLANEOUS) ×8 IMPLANT
PATTIES SURGICAL .5 X1 (DISPOSABLE) ×2 IMPLANT
PATTIES SURGICAL .5X1.5 (GAUZE/BANDAGES/DRESSINGS) ×2 IMPLANT
PLATE ADIRA 2H 17 RLX (Plate) IMPLANT
PUTTY DBX 2.5CC (Putty) ×2 IMPLANT
PUTTY DBX 2.5CC DEPUY (Putty) IMPLANT
ROD PRE LORDOSED VIPER 5.5X50 (Rod) IMPLANT
SCREW ALIGN ADIRA M4.5 GRN (Screw) IMPLANT
SCREW POLY VIPER2 7X40MM (Screw) IMPLANT
SCREW POLY X-TAB VIPER 7.0X50 (Screw) IMPLANT
SCREW SET SINGLE INNER MIS (Screw) IMPLANT
SCREW VA ADIRA 5.5X50 (Screw) IMPLANT
SPACER RISE-L 18X55 7-14MM (Spacer) IMPLANT
SPONGE INTESTINAL PEANUT (DISPOSABLE) ×6 IMPLANT
SPONGE SURGIFOAM ABS GEL 100 (HEMOSTASIS) ×2 IMPLANT
SPONGE T-LAP 4X18 ~~LOC~~+RFID (SPONGE) ×2 IMPLANT
STAPLER SKIN PROX WIDE 3.9 (STAPLE) IMPLANT
STAPLER VISISTAT 35W (STAPLE) ×2 IMPLANT
STRIP CLOSURE SKIN 1/2X4 (GAUZE/BANDAGES/DRESSINGS) ×4 IMPLANT
SURGIFLO W/THROMBIN 8M KIT (HEMOSTASIS) IMPLANT
SUT MNCRL AB 4-0 PS2 18 (SUTURE) ×4 IMPLANT
SUT VIC AB 0 CT1 18XCR BRD 8 (SUTURE) ×4 IMPLANT
SUT VIC AB 1 CT1 18XCR BRD 8 (SUTURE) ×4 IMPLANT
SUT VIC AB 2-0 CT1 TAPERPNT 27 (SUTURE) IMPLANT
SUT VIC AB 2-0 CT2 18 VCP726D (SUTURE) ×4 IMPLANT
SUT VIC AB 2-0 CT2 27 (SUTURE) IMPLANT
SYR 20ML LL LF (SYRINGE) ×4 IMPLANT
SYR BULB IRRIG 60ML STRL (SYRINGE) ×4 IMPLANT
SYR CONTROL 10ML LL (SYRINGE) ×6 IMPLANT
SYR TB 1ML LUER SLIP (SYRINGE) ×2 IMPLANT
TAP CANN VIPER2 DL 6.0 (TAP) IMPLANT
TAPE CLOTH 4X10 WHT NS (GAUZE/BANDAGES/DRESSINGS) IMPLANT
TOWEL GREEN STERILE (TOWEL DISPOSABLE) ×2 IMPLANT
TOWEL GREEN STERILE FF (TOWEL DISPOSABLE) ×2 IMPLANT
TRAY FOLEY MTR SLVR 16FR STAT (SET/KITS/TRAYS/PACK) ×4 IMPLANT
WATER STERILE IRR 1000ML POUR (IV SOLUTION) ×4 IMPLANT
YANKAUER SUCT BULB TIP NO VENT (SUCTIONS) ×4 IMPLANT

## 2023-04-22 NOTE — Anesthesia Postprocedure Evaluation (Signed)
 Anesthesia Post Note  Patient: Vincent Guerra  Procedure(s) Performed: ANTERIOR LATERAL LUMBAR FUSION 1 LEVEL (Left: Spine Lumbar) POSTERIOR LUMBAR FUSION 1 LEVEL (Spine Lumbar)     Patient location during evaluation: PACU Anesthesia Type: General Level of consciousness: awake and alert Pain management: pain level controlled Vital Signs Assessment: post-procedure vital signs reviewed and stable Respiratory status: spontaneous breathing, nonlabored ventilation, respiratory function stable and patient connected to nasal cannula oxygen Cardiovascular status: blood pressure returned to baseline and stable Postop Assessment: no apparent nausea or vomiting Anesthetic complications: no   No notable events documented.  Last Vitals:  Vitals:   04/22/23 1245 04/22/23 1307  BP: 117/76 131/81  Pulse: 81 78  Resp: 18 20  Temp: 36.5 C 36.4 C  SpO2: 95% 93%    Last Pain:  Vitals:   04/22/23 1413  TempSrc:   PainSc: 7                  Hurley [place]

## 2023-04-22 NOTE — H&P (Signed)
 PREOPERATIVE H&P  Chief Complaint: Right leg pain  HPI: Vincent Guerra is a 73 y.o. male who presents with ongoing pain in the right leg  MRI reveals severe stenosis at L3/4   Patient has failed multiple forms of conservative care and continues to have pain (see office notes for additional details regarding the patient's full course of treatment)  Past Medical History:  Diagnosis Date   Allergy    Arthritis    Diabetes mellitus without complication (HCC)    Diverticulosis    GERD (gastroesophageal reflux disease)    Hyperlipidemia    Past Surgical History:  Procedure Laterality Date   BACK SURGERY  01/17/2015   Dr Wyline Mood Marcy Panning), Multiple diskectomy and herniated disc   CHOLECYSTECTOMY  07/07/2019   TOTAL KNEE ARTHROPLASTY Left 01/17/2019   Procedure: TOTAL KNEE ARTHROPLASTY;  Surgeon: Dannielle Huh, MD;  Location: [facility];  Service: Orthopedics;  Laterality: Left;  75 mins needed for length of case. 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, Street)  Tobacco Use   Smoking status: Former    Current packs/day: 0.00    Types: Cigarettes    Quit date: 1994    Years since quitting: 31.2   Smokeless tobacco: Never  Vaping Use   Vaping status: Never Used  Substance and Sexual Activity   Alcohol use: Yes    Alcohol/week: 24.0 standard drinks of alcohol    Types: 24 Cans of beer per week   Drug use: No   Sexual activity: Not on file  Other Topics Concern   Not on file  Social History Narrative   Not on file   Social Drivers of Health   Financial Resource Strain: Low Risk  (10/16/2020)   Overall Financial Resource Strain (CARDIA)    Difficulty of Paying Living Expenses: Not hard at all   Food Insecurity: No Food Insecurity (10/16/2020)   Hunger Vital Sign    Worried About Running Out of Food in the Last Year: Never true    Ran Out of Food in the Last Year: Never true  Transportation Needs: No Transportation Needs (10/16/2020)   PRAPARE - Administrator, Civil Service (Medical): No    Lack of Transportation (Non-Medical): No  Physical Activity: Insufficiently Active (10/16/2020)   Exercise Vital Sign    Days of Exercise per Week: 7 days    Minutes of Exercise per Session: 20 min  Stress: No Stress Concern Present (10/16/2020)   [employer] of Occupational Health - Occupational Stress Questionnaire    Feeling of Stress : Not at all  Social Connections: Moderately Isolated (10/16/2020)   Social Connection and Isolation Panel [NHANES]    Frequency of Communication with Friends and Family: More than three times a week    Frequency of Social Gatherings with Friends and Family: Twice a week    Attends Religious Services: Never    Database administrator or Organizations: No    Attends Engineer, structural: Never    Marital Status: Married   Family History  Problem Relation Age of Onset   Heart disease Mother    Alzheimer's disease Mother    Heart attack  Mother    Prostate cancer Father 74   Heart disease Brother 39       CABG   Heart disease Brother        CABG   Allergies  Allergen Reactions   Metformin And Related Diarrhea    Even extended release Leg cramps   Metronidazole Hives   Tetanus Toxoid Adsorbed Hives   Prior to Admission medications   Medication Sig Start Date End Date Taking? Authorizing Provider  aspirin 81 MG chewable tablet Chew 81 mg by mouth daily.   Yes [provider]  Carboxymeth-Glyc-Polysorb PF (REFRESH OPTIVE MEGA-3) 0.5-1-0.5 % SOLN Place 1 drop into both eyes daily as needed (Dry eyes).   Yes [provider]  diclofenac sodium (VOLTAREN) 1 % GEL Apply 2 g topically 3 (three) times daily as needed.  Use for hands and knee arthritis, joint pain Patient taking differently: Apply 2 g topically 3 (three) times daily as needed (shoulder pain/hands & knee pain.). 03/02/17  Yes Karamalegos, Netta Neat, DO  famotidine (PEPCID) 20 MG tablet TAKE 1 TABLET BY MOUTH 2 TIMES DAILY AS NEEDED FOR HEARTBURN OR INDIGESTION Patient taking differently: Take 20 mg by mouth 2 (two) times daily. FOR HEARTBURN OR INDIGESTION 03/23/23  Yes Margarita Mail, DO  finasteride (PROSCAR) 5 MG tablet TAKE 1 TABLET BY MOUTH DAILY. 04/10/23  Yes Margarita Mail, DO  fluticasone (FLONASE) 50 MCG/ACT nasal spray SPRAY TWICE INTO EACH NOSTRIL EVERY MORNING Patient taking differently: Place 1 spray into both nostrils daily. 10/16/22  Yes Margarita Mail, DO  loratadine (CLARITIN) 10 MG tablet Take 10 mg by mouth 2 (two) times daily.    Yes [provider]  Magnesium 500 MG TABS Take 500 mg by mouth at bedtime.   Yes [provider]  meloxicam (MOBIC) 15 MG tablet Take 1 tablet (15 mg total) by mouth daily. 03/23/23  Yes Margarita Mail, DO  Multiple Vitamin (MULTIVITAMIN WITH MINERALS) TABS tablet Take 1 tablet by mouth at bedtime.   Yes [provider]  Omega-3 Fatty Acids (FISH OIL) 1200 MG CAPS Take 1,200 mg by mouth daily.   Yes [provider]  Potassium 99 MG TABS Take 99 mg by mouth in the morning.   Yes [provider]  Semaglutide (RYBELSUS) 3 MG TABS Take 1 tablet (3 mg total) by mouth daily. 03/23/23  Yes Margarita Mail, DO  simvastatin (ZOCOR) 40 MG tablet TAKE 1 TABLET BY MOUTH AT BEDTIME 03/19/23  Yes Margarita Mail, DO  tamsulosin (FLOMAX) 0.4 MG CAPS capsule TAKE ONE CAPSULE DAILY 30 MINUTES AFTER THE SAME MEAL EACH DAY 03/04/23  Yes Margarita Mail, DO  Turmeric 500 MG CAPS Take 500 mg by mouth every evening.   Yes [provider]     All other systems have been reviewed and were otherwise negative with the exception of those mentioned in the  HPI and as above.  Physical Exam: Vitals:   04/22/23 0706 04/22/23 0732  BP: (!) 126/92   Pulse: 81   Resp: 16   Temp:  98 F (36.7 C)  SpO2: 93%     Body mass index is 32.34 kg/m.  General: Alert, no acute distress Cardiovascular: No pedal edema Respiratory: No cyanosis, no use of accessory musculature Skin: No lesions in the area of chief complaint Neurologic: Sensation intact distally Psychiatric: Patient is competent for consent with normal mood and affect Lymphatic: No axillary or cervical lymphadenopathy   Assessment/Plan: LUMBAR RADICULOPATHY, L3, DUE TO SEVERE STENOSIS AT  L3/4  Plan for Procedure(s): ANTERIOR LATERAL LUMBAR FUSION 1 LEVEL POSTERIOR LUMBAR FUSION 1 LEVEL   Jackelyn Hoehn, MD 04/22/2023 8:16 AM

## 2023-04-22 NOTE — Plan of Care (Signed)

## 2023-04-22 NOTE — Op Note (Addendum)
 PATIENT NAME: Vincent Guerra   MEDICAL RECORD NO.:   [id number]    DATE OF BIRTH: 10-13-1950   DATE OF PROCEDURE: 04/22/2023                               OPERATIVE REPORT   PREOPERATIVE DIAGNOSES: 1.  Right-sided L3 radiculopathy 2.  S/p previous L3/4 decompression by another provider in 2016 3.  Segmental collapse on the right at L3/4, with severe neuroforaminal stenosis   POSTOPERATIVE DIAGNOSES: 1.  Right-sided L3 radiculopathy 2.  S/p previous L3/4 decompression by another provider in 2016 3.  Segmental collapse on the right at L3/4, with severe neuroforaminal stenosis   PROCEDURE:  1.  Left-sided lateral interbody fusion, L3/4  via direct lateral retroperitoneal approach. 2.  Insertion of interbody device x1 (18 x 55mm Globus intervertebral spacer). 3.  Placement of anterior instrumentation, L3/4 (Of note, the anterior plate and screws were separate from, and not integral to, the intervertebral spacer) 4.  Use of morselized allograft -- ViviGen.   5.  Intraoperative use of fluoroscopy. 6.  Posterior spinal fusion, L3-4 7.  Placement of posterior instrumentation, L3, L4   SURGEON:  Estill Bamberg, MD   ASSISTANT:  Skip Mayer, PA-C.   ANESTHESIA:  General endotracheal anesthesia.   COMPLICATIONS:  None.   DISPOSITION:  Stable.   ESTIMATED BLOOD LOSS:  Minimal.   INDICATIONS:  Briefly, Vincent Guerra is a very pleasant 73 year-old male who did present to me with ongoing pain and weakness in the right leg.  He is noted to be status post a previous decompression at L3-4.  More recently, he has been having severe pain in the right leg with standing and walking.  His pain distribution is very much consistent with right L3 radiculopathy.  His imaging studies found no severe neuroforaminal stenosis on the right at L3-4.  Okay he did have an injection which helped temporarily, but his pain returned.  We therefore did discuss proceeding with the surgery noted above. The patient did  wish to proceed, after a full understanding of the risks and benefits of surgery.   DESCRIPTION OF PROCEDURE:  On 04/22/2023, the patient was brought to surgery and general endotracheal anesthesia was administered.  The patient was placed in the lateral decubitus position, with the left side up. Neurologic monitoring leads were placed by the monitoring technician.  The patient's torso and lower extremities were secured to the bed.  The patient's hips and knees were flexed in order to lessen the tension on the psoas musculature.  The left flank was then prepped and draped in the usual sterile fashion.  The bed was flexed, in order to optimize exposure to the L3/4 intervertebral space.  After a timeout procedure was performed, a left-sided transverse incision was made over the left flank overlying the L3/4 intervertebral space.  The retroperitoneal space was encountered, after dissection through the oblique musculature.  The peritoneum was bluntly swept anteriorly, and the psoas was readily identified.  I did use a series of dilators to dock over the L3/4 intervertebral space.  I did use neurologic monitoring while placing the dilators, in order to ensure that there were no neurologic structures in the immediate vicinity of the dilators.  The lumbar plexus was noted to be posterior.  A self-retaining retractor was placed, and was attached to a rigid arm.  The retractor was very gently dilated and a shim was placed into  the L3/4 intervertebral space.  I then used a knife to perform an annulotomy at the lateral aspect of the L3/4 intervertebral space.  I then used a series of curettes and pituitary rongeurs in order to perform a thorough and complete L3/4 intervertebral diskectomy.  The contralateral annulus was released.  I then placed a series of intervertebral spacer trials, and I did feel that a 18 x 55 mm spacer would be the most appropriate fit.  The appropriate spacer was then packed with ViviGen and tamped into  position.  At this point, the spacer was expanded to 10 mm in height.  I was very pleased with the press-fit of the spacer, and the restoration of disc height across the L3-4 segment. I was very pleased with the final resting position of the intervertebral spacer.  At this point, a 17 mm plate was placed over the lateral aspect of the L3 and L4 vertebral  bodies.  I then used an awl to prepare the trajectory of the L3 and L4 vertebral body screws.  A 50 mm screw was placed into the L3 vertebral body, and a 50 mm screw was placed into the L4 vertebral body.  The screws were then locked into the plate.  Of note, the plate and screws were separate from, and not integral to, the intervertebral spacer.  I was very pleased with the final AP and lateral fluoroscopic images and the excellent restoration of disk height identified on both AP and lateral images.  At this point, the wound was copiously irrigated.  The fascia, internal, and external oblique musculature was closed using #1 Vicryl.  The subcutaneous layer was closed using 2-0 Vicryl and the skin was closed using 4-0 Monocryl.    At this point, I made a left-sided paramedian incision overlying the L3 and L4 pedicles and overlying the L3-4 facet joint.  The facet joint was exposed and decorticated, as was the posterolateral gutter.  The remainder of the Vivigen allograft was placed along the facet joint and gutter to help aid in the success of the fusion.  At this point, Jamshidi's were advanced across the L3 and L4 pedicles.  Guidewires were placed, and I did use a 6 mm tap over the guidewires, and through the L3 and L4 pedicles.  At this point, pedicle screws were placed, 7 x 50 at L3 and a 7 x 40 mm screw at L4.  A 50 mm rod was secured into the tulip heads of the screws.  Caps were placed in a final locking procedure was performed.  The wound was copiously irrigated.  I was very pleased with the AP and lateral fluoroscopic images.  The wound was then closed  using #1 Vicryl followed by 2-0 Vicryl followed by 4-0 Monocryl.  Benzoin and Steri-Strips were applied over the left lateral wound and left posterior wound, followed by sterile dressing.      Of note, I did use neurologic monitoring throughout the entire surgery, and there was no sustained EMG activity noted throughout the entire surgery. All instrument counts were correct at the termination of the procedure.   Of note, Skip Mayer, PA-C was my assistant throughout surgery, and did aid in retraction, placement of the hardware, suctioning, and closure.  Estill Bamberg, MD

## 2023-04-22 NOTE — Anesthesia Procedure Notes (Signed)
 Procedure Name: Intubation Date/Time: 04/22/2023 8:42 AM  Performed by: Orlin Hilding, CRNAPre-anesthesia Checklist: Patient identified, Emergency Drugs available, Suction available, Patient being monitored and Timeout performed Patient Re-evaluated:Patient Re-evaluated prior to induction Oxygen Delivery Method: Circle system utilized Preoxygenation: Pre-oxygenation with 100% oxygen Induction Type: IV induction Ventilation: Mask ventilation without difficulty and Oral airway inserted - appropriate to patient size Laryngoscope Size: Glidescope and 4 Grade View: Grade I Tube type: Oral Tube size: 7.5 mm Number of attempts: 1 Placement Confirmation: ETT inserted through vocal cords under direct vision, positive ETCO2 and breath sounds checked- equal and bilateral Secured at: 23 cm Tube secured with: Tape Dental Injury: Teeth and Oropharynx as per pre-operative assessment

## 2023-04-22 NOTE — Transfer of Care (Signed)
 Immediate Anesthesia Transfer of Care Note  Patient: Aaditya Letizia  Procedure(s) Performed: ANTERIOR LATERAL LUMBAR FUSION 1 LEVEL (Left: Spine Lumbar) POSTERIOR LUMBAR FUSION 1 LEVEL (Spine Lumbar)  Patient Location: PACU  Anesthesia Type:General  Level of Consciousness: drowsy and patient cooperative  Airway & Oxygen Therapy: Patient Spontanous Breathing and Patient connected to face mask oxygen  Post-op Assessment: Report given to RN and Post -op Vital signs reviewed and stable  Post vital signs: Reviewed and stable  Last Vitals:  Vitals Value Taken Time  BP 123/84 04/22/23 1130  Temp 36.7 C 04/22/23 1130  Pulse 91 04/22/23 1133  Resp 21 04/22/23 1133  SpO2 94 % 04/22/23 1133  Vitals shown include unfiled device data.  Last Pain:  Vitals:   04/22/23 0732  TempSrc: Oral  PainSc:          Complications: No notable events documented.

## 2023-04-23 LAB — GLUCOSE, CAPILLARY: Glucose-Capillary: 151 mg/dL — ABNORMAL HIGH (ref 70–99)

## 2023-04-23 NOTE — Progress Notes (Signed)
 Patient alert and oriented, void, ambulate. Surgical dressing change per order. D/c instructions explain and given to the patient all questions answered.

## 2023-04-23 NOTE — Evaluation (Signed)
 Physical Therapy Brief Evaluation and Discharge Note Patient Details Name: Vincent Guerra MRN: [id number] DOB: 1950/09/24 Today's Date: 04/23/2023   History of Present Illness  The pt is a 73 yo male presenting 3/12 for lateral interbody fusion of L3-4. PMH includes: arthritis, DM II, HLD, and prior L TKA and back surgery.   Clinical Impression  Pt in bed upon arrival of PT, agreeable to evaluation at this time. Prior to admission the pt was completely independent without need for DME and reports no recent falls. The pt lives with his spouse in a home with 3 steps to enter, has various DME available, and is hopeful to return to full independence without pain. The pt was able to complete bed mobility with cues for log roll, sit-stand transfers, hallway ambulation, and stairs without need for assistance, but does use RW for management of L hip flexor pain. Pt educated on spinal precautions, use of brace, progressive walking program, and car transfers with pt reporting understanding, no further acute PT needs identified at this time. Thank you for the consult.            PT Assessment Patient does not need any further PT services  Assistance Needed at Discharge  Intermittent Supervision/Assistance    Equipment Recommendations None recommended by PT  Recommendations for Other Services       Precautions/Restrictions Precautions Precautions: Back;Fall Precaution Booklet Issued: Yes (comment) Recall of Precautions/Restrictions: Intact Required Braces or Orthoses: Spinal Brace Spinal Brace: Thoracolumbosacral orthotic;Applied in sitting position Restrictions Weight Bearing Restrictions Per Provider Order: No        Mobility  Bed Mobility Rolling: Supervision Supine/Sidelying to sit: Min assist   General bed mobility comments: supervision for roll, minA to elevate trunk for log roll. pt plans to sleep in recliner  Transfers Overall transfer level: Needs assistance Equipment  used: Rolling walker (2 wheels) Transfers: Sit to/from Stand Sit to Stand: Supervision           General transfer comment: supervision, cues for hand placement    Ambulation/Gait Ambulation/Gait assistance: Contact guard assist, Supervision Gait Distance (Feet): 400 Feet Assistive device: Rolling walker (2 wheels) Gait Pattern/deviations: Step-through pattern, Decreased stride length, Decreased step length - left Gait Speed: Below normal General Gait Details: limited L hip flexion  Home Activity Instructions Home Activity Instructions: progressive walking program, brace use, exercises  Stairs Stairs: Yes Stairs assistance: Supervision Stair Management: One rail Right, Alternating pattern, Forwards Number of Stairs: 6    Modified Rankin (Stroke Patients Only)        Balance Overall balance assessment: Mild deficits observed, not formally tested Sitting-balance support: No upper extremity supported, Feet supported Sitting balance-Leahy Scale: Good     Standing balance support: Bilateral upper extremity supported, During functional activity Standing balance-Leahy Scale: Fair Standing balance comment: can static stand without UE support          Pertinent Vitals/Pain PT - Brief Vital Signs All Vital Signs Stable: Yes Pain Assessment Pain Assessment: Faces Faces Pain Scale: Hurts even more Pain Location: L hip flexors, L side incision Pain Descriptors / Indicators: Aching, Discomfort, Grimacing Pain Intervention(s): Limited activity within patient's tolerance, Monitored during session, Repositioned     Home Living Family/patient expects to be discharged to:: Private residence Living Arrangements: Spouse/significant other Available Help at Discharge: Family;Available PRN/intermittently (wife has had stroke, unable to fully assist, daughter PRN) Home Environment: Stairs to enter  Progress Energy of Steps: 3 at front, 5 in back Home Equipment: Agricultural consultant (2  wheels);Cane - single point;Shower seat;Grab bars - tub/shower;Hand held shower head        Prior Function Level of Independence: Independent Comments: independent, limited to ~80 ft then needing seated rest    UE/LE Assessment   UE ROM/Strength/Tone/Coordination: WFL    LE ROM/Strength/Tone/Coordination: Impaired LE ROM/Strength/Tone/Coordination Deficits: LLE hip flexion limited by pain, otherwise [facility]    Communication   Communication Communication: No apparent difficulties     Cognition Overall Cognitive Status: Appears within functional limits for tasks assessed/performed       General Comments General comments (skin integrity, edema, etc.): VSS oN RA     PT Visit Diagnosis Unsteadiness on feet (R26.81);Pain    No Skilled PT All education completed;Patient will have necessary level of assist by caregiver at discharge;Patient is supervision for all activity/mobility    AMPAC 6 Clicks Help needed turning from your back to your side while in a flat bed without using bedrails?: None Help needed moving from lying on your back to sitting on the side of a flat bed without using bedrails?: A Little Help needed moving to and from a bed to a chair (including a wheelchair)?: A Little Help needed standing up from a chair using your arms (e.g., wheelchair or bedside chair)?: A Little Help needed to walk in hospital room?: A Little Help needed climbing 3-5 steps with a railing? : A Little 6 Click Score: 19      End of Session Equipment Utilized During Treatment: Gait belt;Back brace Activity Tolerance: Patient tolerated treatment well;Patient limited by pain Patient left: in chair;with call bell/phone within reach Nurse Communication: Mobility status PT Visit Diagnosis: Unsteadiness on feet (R26.81);Pain Pain - Right/Left: Left Pain - part of body: Hip     Time: 0802-0835 PT Time Calculation (min) (ACUTE ONLY): 33 min  Charges:   PT Evaluation $PT Eval Low  Complexity: 1 Low PT Treatments $Therapeutic Activity: 8-22 mins    Vickki Muff, PT, DPT   Acute Rehabilitation Department Office [phone] Secure Chat Communication Preferred  Ronnie Derby  04/23/2023, 9:03 AM

## 2023-04-23 NOTE — Evaluation (Signed)
 Occupational Therapy Evaluation Patient Details Name: Vincent Guerra MRN: [id number] DOB: [birth year] Today's Date: 04/23/2023   History of Present Illness   The pt is a 73 yo male presenting 3/12 for lateral interbody fusion of L3-4. PMH includes: arthritis, DM II, HLD, and prior L TKA and back surgery.     Clinical Impressions Vincent Guerra was evaluated s/p the above spine surgery. He is indep and lives with his wife at baseline. Upon evaluation pt was limited by surgical pain, groin pain, spinal precautions, activity tolerance and generalized weakness. Overall he needed supervision A and increased time for tranfers and mobility with RW. He also requires up to min A for LB ADLs to maintain BLT and manage pain, he declined AE and reports his wife will assist at discharge. Provided cues and education on spinal precautions and compensatory techniques throughout, handout provided and pt demonstrated great recall during ADLs and mobility. Pt does not require further acute OT services. Recommend d/c home with support of family.       If plan is discharge home, recommend the following:   A little help with bathing/dressing/bathroom;Assistance with cooking/housework;Assist for transportation      Equipment Recommendations   None recommended by OT      Precautions/Restrictions   Precautions Precautions: Back;Fall Precaution Booklet Issued: Yes (comment) Recall of Precautions/Restrictions: Intact Required Braces or Orthoses: Spinal Brace Spinal Brace: Thoracolumbosacral orthotic;Applied in sitting position Restrictions Weight Bearing Restrictions Per Provider Order: No     Mobility Bed Mobility               General bed mobility comments: OOB upon arrival, he plans to sleep in recliner at home initially    Transfers Overall transfer level: Needs assistance Equipment used: Rolling walker (2 wheels) Transfers: Sit to/from Stand Sit to Stand: Supervision                   Balance Overall balance assessment: Needs assistance Sitting-balance support: No upper extremity supported, Feet supported Sitting balance-Leahy Scale: Good     Standing balance support: Bilateral upper extremity supported, During functional activity Standing balance-Leahy Scale: Fair                             ADL either performed or assessed with clinical judgement   ADL Overall ADL's : Needs assistance/impaired Eating/Feeding: Independent   Grooming: Supervision/safety;Standing   Upper Body Bathing: Set up;Sitting   Lower Body Bathing: Contact guard assist;Sit to/from stand   Upper Body Dressing : Set up;Sitting   Lower Body Dressing: Minimal assistance;Sit to/from stand Lower Body Dressing Details (indicate cue type and reason): min A for socks and shoes, pt declined need for AE. Wife will assist at d/c Toilet Transfer: Supervision/safety   Toileting- Architect and Hygiene: Supervision/safety       Functional mobility during ADLs: Supervision/safety;Rolling walker (2 wheels) General ADL Comments: pt is limited by surgical pain and R groin pain, minimal assist needed for LB ADLs adn increased time for safety and pain management     Vision Baseline Vision/History: 0 No visual deficits Vision Assessment?: No apparent visual deficits     Perception Perception: Within Functional Limits       Praxis Praxis: WFL       Pertinent Vitals/Pain Pain Assessment Pain Assessment: Faces Faces Pain Scale: Hurts even more Pain Location: L hip flexors, L side incision Pain Descriptors / Indicators: Aching, Discomfort, Grimacing Pain Intervention(s): Limited activity within patient's  tolerance, Monitored during session     Extremity/Trunk Assessment Upper Extremity Assessment Upper Extremity Assessment: Overall WFL for tasks assessed   Lower Extremity Assessment Lower Extremity Assessment: Defer to PT evaluation   Cervical / Trunk  Assessment Cervical / Trunk Assessment: Back Surgery   Communication Communication Communication: No apparent difficulties   Cognition Arousal: Alert Behavior During Therapy: WFL for tasks assessed/performed Cognition: No apparent impairments             OT - Cognition Comments: good recall of spinal precautions                 Following commands: Intact       Cueing  General Comments      VSS on RA           Home Living Family/patient expects to be discharged to:: Private residence Living Arrangements: Spouse/significant other Available Help at Discharge: Family;Available PRN/intermittently Type of Home: House       Home Layout: One level     Bathroom Shower/Tub: Arts development officer Toilet: Handicapped height     Home Equipment: Agricultural consultant (2 wheels);Cane - single point;Shower seat;Grab bars - tub/shower;Hand held shower head (bidet)          Prior Functioning/Environment Prior Level of Function : Independent/Modified Independent                            OT Goals(Current goals can be found in the care plan section)   Acute Rehab OT Goals Patient Stated Goal: home asap OT Goal Formulation: With patient Time For Goal Achievement: 05/07/23 Potential to Achieve Goals: Good   OT Frequency:  Min 1X/week       AM-PAC OT "6 Clicks" Daily Activity     Outcome Measure Help from another person eating meals?: None Help from another person taking care of personal grooming?: A Little Help from another person toileting, which includes using toliet, bedpan, or urinal?: A Little Help from another person bathing (including washing, rinsing, drying)?: A Little Help from another person to put on and taking off regular upper body clothing?: A Little Help from another person to put on and taking off regular lower body clothing?: A Little 6 Click Score: 19   End of Session Equipment Utilized During Treatment: Back brace;Rolling  walker (2 wheels) Nurse Communication: Mobility status  Activity Tolerance: Patient tolerated treatment well Patient left: in chair;with call bell/phone within reach  OT Visit Diagnosis: Unsteadiness on feet (R26.81)                Time: 9528-4132 OT Time Calculation (min): 15 min Charges:  OT General Charges $OT Visit: 1 Visit OT Evaluation $OT Eval Moderate Complexity: 1 Mod  Derenda Mis, OTR/L Acute Rehabilitation Services Office [phone] Secure Chat Communication Preferred   Donia Pounds 04/23/2023, 10:09 AM

## 2023-04-23 NOTE — Progress Notes (Signed)
    Patient doing well    Physical Exam: Vitals:   04/22/23 2256 04/23/23 0320  BP: (!) 141/79 (!) 147/91  Pulse: 88 88  Resp: 20 18  Temp: 98.8 F (37.1 C) 98.4 F (36.9 C)  SpO2: 97% 97%    Dressing in place NVI  POD #1 s/p L3/4 fusion, doing well  - up with PT/OT, encourage ambulation - Percocet for pain, Robaxin for muscle spasms - d/c home today with f/u in 2 weeks

## 2023-04-24 ENCOUNTER — Telehealth: Payer: Self-pay

## 2023-04-24 ENCOUNTER — Encounter (HOSPITAL_COMMUNITY): Payer: Self-pay | Admitting: Orthopedic Surgery

## 2023-04-24 NOTE — Transitions of Care (Post Inpatient/ED Visit) (Signed)
 04/24/2023  Name: Vincent Guerra MRN: [id number] DOB: Jun 22, 1950  Today's TOC FU Call Status: Today's TOC FU Call Status:: Successful TOC FU Call Completed TOC FU Call Complete Date: 04/24/23 Patient's Name and Date of Birth confirmed.  Transition Care Management Follow-up Telephone Call Date of Discharge: 04/24/23 Discharge Facility: [facility]) Type of Discharge: Inpatient Admission Primary Inpatient Discharge Diagnosis:: Spinal stenosis How have you been since you were released from the hospital?: Better Any questions or concerns?: No  Items Reviewed: Did you receive and understand the discharge instructions provided?: Yes Medications obtained,verified, and reconciled?: Yes (Medications Reviewed) (Medication reconciliation completed based on recent discharge summary Patient taking medications as instructed and is aware of any changes or dosage adjustments medication regimen. Patient denies questions and reports no barriers to medication adherence) Any new allergies since your discharge?: No Dietary orders reviewed?: Yes Type of Diet Ordered:: Reg Heart Healthy Carb Modified Do you have support at home?: Yes People in Home: spouse, child(ren), adult Name of Support/Comfort Primary Source: Wife Alona Bene  Medications Reviewed Today: Medications Reviewed Today     Reviewed by Johnnette Barrios, RN (Registered Nurse) on 04/24/23 at 1036  Med List Status: <None>   Medication Order Taking? Sig Documenting Provider Last Dose Status Informant  aspirin 81 MG chewable tablet [id number] Yes Chew 81 mg by mouth daily. [provider] Taking Active Self  Carboxymeth-Glyc-Polysorb PF (REFRESH OPTIVE MEGA-3) 0.5-1-0.5 % SOLN [id number] Yes Place 1 drop into both eyes daily as needed (Dry eyes). [provider] Taking Active Self  diclofenac sodium (VOLTAREN) 1 % GEL [id number] Yes Apply 2 g topically 3 (three) times daily as needed. Use for hands and knee arthritis, joint  pain  Patient taking differently: Apply 2 g topically 3 (three) times daily as needed (shoulder pain/hands & knee pain.).   Smitty Cords, DO Taking Active Self  famotidine (PEPCID) 20 MG tablet [id number] Yes TAKE 1 TABLET BY MOUTH 2 TIMES DAILY AS NEEDED FOR HEARTBURN OR INDIGESTION  Patient taking differently: Take 20 mg by mouth 2 (two) times daily. FOR HEARTBURN OR INDIGESTION   Margarita Mail, DO Taking Active Self  finasteride (PROSCAR) 5 MG tablet [id number] Yes TAKE 1 TABLET BY MOUTH DAILY. Margarita Mail, DO Taking Active Self  fluticasone (FLONASE) 50 MCG/ACT nasal spray [id number] Yes SPRAY TWICE INTO EACH NOSTRIL EVERY MORNING  Patient taking differently: Place 1 spray into both nostrils daily.   Margarita Mail, DO Taking Active Self  loratadine (CLARITIN) 10 MG tablet [id number] Yes Take 10 mg by mouth 2 (two) times daily.  [provider] Taking Active Self  Magnesium 500 MG TABS [id number] Yes Take 500 mg by mouth at bedtime. [provider] Taking Active Self  methocarbamol (ROBAXIN) 500 MG tablet [id number] Yes Take 1 tablet (500 mg total) by mouth every 6 (six) hours as needed for muscle spasms. Estill Bamberg, MD Taking Active   Multiple Vitamin (MULTIVITAMIN WITH MINERALS) TABS tablet [id number] Yes Take 1 tablet by mouth at bedtime. [provider] Taking Active Self  Omega-3 Fatty Acids (FISH OIL) 1200 MG CAPS [id number] Yes Take 1,200 mg by mouth daily. [provider] Taking Active Self  oxyCODONE-acetaminophen (PERCOCET/ROXICET) 5-325 MG tablet [id number] Yes Take 1-2 tablets by mouth every 4 (four) hours as needed for moderate pain (pain score 4-6) or severe pain (pain score 7-10). Estill Bamberg, MD Taking Active   Potassium 99 MG TABS [id number] Yes Take 99 mg by mouth in the morning. [provider]  Taking Active Self  Semaglutide (RYBELSUS) 3 MG TABS [id number] Yes Take 1 tablet (3 mg total) by mouth daily.  Margarita Mail, DO Taking Active Self  simvastatin (ZOCOR) 40 MG tablet [id number] Yes TAKE 1 TABLET BY MOUTH AT BEDTIME Margarita Mail, DO Taking Active Self  tamsulosin (FLOMAX) 0.4 MG CAPS capsule [id number] Yes TAKE ONE CAPSULE DAILY 30 MINUTES AFTER THE SAME MEAL EACH DAY Margarita Mail, DO Taking Active Self  Turmeric 500 MG CAPS [id number] Yes Take 500 mg by mouth every evening. [provider] Taking Active Self          Medication reconciliation / review completed based on most recent discharge summary and EHR medication list. Confirmed patient is taking all newly prescribed medications as instructed (any discrepancies are noted in review section)   Patient / Caregiver is aware of any changes to and / or  any dosage adjustments to medication regimen. Patient/ Caregiver denies questions at this time and reports no barriers to medication adherence.   Medication updates  START taking: methocarbamol (ROBAXIN) oxyCODONE-acetaminophen (PERCOCET/ROXICET)   Home Care and Equipment/Supplies: Were Home Health Services Ordered?: No Any new equipment or medical supplies ordered?: No  Functional Questionnaire: Do you need assistance with bathing/showering or dressing?: No Do you need assistance with meal preparation?: No Do you need assistance with eating?: No Do you have difficulty maintaining continence: No Do you need assistance with getting out of bed/getting out of a chair/moving?: No Do you have difficulty managing or taking your medications?: No  Follow up appointments reviewed: PCP Follow-up appointment confirmed?: No (They will call and schedule home < 24 hrs) MD Provider Line [phone] Given: No Specialist Hospital Follow-up appointment confirmed?: Yes Date of Specialist follow-up appointment?: 05/06/23 Follow-Up Specialty Provider:: Surgical follow-up Do you need transportation to your follow-up appointment?: No (Family Transport)  SDOH  Interventions Today    Flowsheet Row Most Recent Value  SDOH Interventions   Food Insecurity Interventions Intervention Not Indicated  Housing Interventions Intervention Not Indicated  Transportation Interventions Intervention Not Indicated, Patient Resources (Friends/Family)  Utilities Interventions Intervention Not Indicated      Interventions Today    Flowsheet Row Most Recent Value  Chronic Disease   Chronic disease during today's visit Diabetes, Other  [post surgical]  General Interventions   General Interventions Discussed/Reviewed General Interventions Discussed, General Interventions Reviewed, Doctor Visits  Doctor Visits Discussed/Reviewed Doctor Visits Reviewed, Doctor Visits Discussed, PCP, Specialist  PCP/Specialist Visits Compliance with follow-up visit  Exercise Interventions   Exercise Discussed/Reviewed Physical Activity  Physical Activity Discussed/Reviewed Physical Activity Reviewed, Physical Activity Discussed  Education Interventions   Education Provided Provided Education  Provided Verbal Education On Nutrition, Blood Sugar Monitoring, When to see the doctor, Medication, Applications  [Wearing a spinal brace]  Nutrition Interventions   Nutrition Discussed/Reviewed Nutrition Discussed, Nutrition Reviewed  Pharmacy Interventions   Pharmacy Dicussed/Reviewed Pharmacy Topics Reviewed, Medication Adherence, Medications and their functions  Safety Interventions   Safety Discussed/Reviewed Safety Reviewed, Fall Risk         Benefits reviewed  Based on current information and Insurance plan -Reviewed benefits accessible to patient, including details about eligibility options for care and  available value based care options  if any areas of needs were identified.  Reviewed patient/  caregiver's ability to access and / or  ability with navigating the benefits system..Amb Referral made if indicted , refer to orders section of note for details   Reviewed goals for  care Patient  and / or Caregiveverbalizes understanding  of instructions and care plan provided. Patient / Caregiver was encouraged to make informed decisions about their care, actively participate in managing their health condition, and implement lifestyle changes as needed to promote independence and self-management of health care. There were no reported  barriers to care.   TOC program  Patient is at  risk for readmission and / or has history of  high utilization  Discussed VBCI  TOC program and weekly calls to patient to assess condition/status, medication management  and provide support/education as indicated . Patient  and / or Caregive voiced understanding and declined enrollment in the 30-day TOC Program at this time .   He was admitted for Spinal fusion Completed without complications. He is doing well at home has strong family support,He has follow-up visits scheduled     The Patient  and / or Caregiver  has been provided with contact information for the care management team and has been advised to call with any health-related questions or concerns. Patient was encouraged to Contact PCP with any questions or concerns regarding ongoing medical care, any difficulty obtaining or picking up prescriptions, any changes or worsening in condition including signs / symptoms not relieved  with interventions Patient had no additional questions or concerns at this time.        Susa Loffler , BSN, RN [facility]   VBCI-Population Health RN Care Manager Direct Dial [phone]  Fax: [phone] Website: [email or web address]

## 2023-05-04 ENCOUNTER — Other Ambulatory Visit: Payer: Self-pay | Admitting: Internal Medicine

## 2023-05-05 DIAGNOSIS — M5416 Radiculopathy, lumbar region: Secondary | ICD-10-CM | POA: Diagnosis not present

## 2023-05-06 NOTE — Discharge Summary (Signed)
 Patient ID: Vincent Guerra MRN: [id number] DOB/AGE: 1951-01-30 73 y.o.  Admit date: 04/22/2023 Discharge date: 3/132025  Admission Diagnoses:  Principal Problem:   Spinal stenosis   Discharge Diagnoses:  Same  Past Medical History:  Diagnosis Date   Allergy    Arthritis    Diabetes mellitus without complication (HCC)    Diverticulosis    GERD (gastroesophageal reflux disease)    Hyperlipidemia     Surgeries: Procedure(s): ANTERIOR LATERAL LUMBAR FUSION 1 LEVEL POSTERIOR LUMBAR FUSION 1 LEVEL on 04/22/2023   Consultants: None  Discharged Condition: Improved  Hospital Course: Vincent Guerra is an 73 y.o. male who was admitted 04/22/2023 for operative treatment of Spinal stenosis. Patient has severe unremitting pain that affects sleep, daily activities, and work/hobbies. After pre-op clearance the patient was taken to the operating room on 04/22/2023 and underwent  Procedure(s): ANTERIOR LATERAL LUMBAR FUSION 1 LEVEL POSTERIOR LUMBAR FUSION 1 LEVEL.    Patient was given perioperative antibiotics:  Anti-infectives (From admission, onward)    Start     Dose/Rate Route Frequency Ordered Stop   04/22/23 1700  ceFAZolin (ANCEF) IVPB 2g/100 mL premix        2 g 200 mL/hr over 30 Minutes Intravenous Every 8 hours 04/22/23 1240 04/23/23 1128   04/22/23 0700  ceFAZolin (ANCEF) IVPB 2g/100 mL premix        2 g 200 mL/hr over 30 Minutes Intravenous On call to O.R. 04/22/23 4098 04/22/23 0843        Patient was given sequential compression devices, early ambulation to prevent DVT.  Patient benefited maximally from hospital stay and there were no complications.    Recent vital signs: BP 108/75 (BP Location: Left Arm)   Pulse 90   Temp 97.8 F (36.6 C)   Resp 18   Ht 5\' 10"  (1.778 m)   Wt 102.2 kg   SpO2 95%   BMI 32.34 kg/m    Discharge Medications:   Allergies as of 04/23/2023       Reactions   Metformin And Related Diarrhea   Even extended release Leg  cramps   Metronidazole Hives   Tetanus Toxoid Adsorbed Hives        Medication List     TAKE these medications    aspirin 81 MG chewable tablet Chew 81 mg by mouth daily.   diclofenac sodium 1 % Gel Commonly known as: VOLTAREN Apply 2 g topically 3 (three) times daily as needed. Use for hands and knee arthritis, joint pain What changed:  reasons to take this additional instructions   famotidine 20 MG tablet Commonly known as: PEPCID TAKE 1 TABLET BY MOUTH 2 TIMES DAILY AS NEEDED FOR HEARTBURN OR INDIGESTION What changed:  how much to take how to take this when to take this additional instructions   finasteride 5 MG tablet Commonly known as: PROSCAR TAKE 1 TABLET BY MOUTH DAILY.   Fish Oil 1200 MG Caps Take 1,200 mg by mouth daily.   fluticasone 50 MCG/ACT nasal spray Commonly known as: FLONASE SPRAY TWICE INTO EACH NOSTRIL EVERY MORNING What changed: See the new instructions.   loratadine 10 MG tablet Commonly known as: CLARITIN Take 10 mg by mouth 2 (two) times daily.   Magnesium 500 MG Tabs Take 500 mg by mouth at bedtime.   methocarbamol 500 MG tablet Commonly known as: ROBAXIN Take 1 tablet (500 mg total) by mouth every 6 (six) hours as needed for muscle spasms.   multivitamin with minerals  Tabs tablet Take 1 tablet by mouth at bedtime.   oxyCODONE-acetaminophen 5-325 MG tablet Commonly known as: PERCOCET/ROXICET Take 1-2 tablets by mouth every 4 (four) hours as needed for moderate pain (pain score 4-6) or severe pain (pain score 7-10).   Potassium 99 MG Tabs Take 99 mg by mouth in the morning.   Refresh Optive Mega-3 0.5-1-0.5 % Soln Generic drug: Carboxymeth-Glyc-Polysorb PF Place 1 drop into both eyes daily as needed (Dry eyes).   Rybelsus 3 MG Tabs Generic drug: Semaglutide Take 1 tablet (3 mg total) by mouth daily.   simvastatin 40 MG tablet Commonly known as: ZOCOR TAKE 1 TABLET BY MOUTH AT BEDTIME   tamsulosin 0.4 MG Caps  capsule Commonly known as: FLOMAX TAKE ONE CAPSULE DAILY 30 MINUTES AFTER THE SAME MEAL EACH DAY   Turmeric 500 MG Caps Take 500 mg by mouth every evening.        Diagnostic Studies: DG Lumbar Spine 2-3 Views Result Date: 04/22/2023 CLINICAL DATA:  Elective surgery. EXAM: LUMBAR SPINE - 2-3 VIEW COMPARISON:  None Available. FINDINGS: Two fluoroscopic spot views of the lumbar spine submitted from the operating room. L3-L4 fusion hardware. Fluoroscopy time 3 minutes 23 seconds. Dose 174.51 mGy. IMPRESSION: Intraoperative fluoroscopy during lumbar spine surgery. Electronically Signed   By: Narda Rutherford M.D.   On: 04/22/2023 14:38   DG C-Arm 1-60 Min-No Report Result Date: 04/22/2023 Fluoroscopy was utilized by the requesting physician.  No radiographic interpretation.   DG C-Arm 1-60 Min-No Report Result Date: 04/22/2023 Fluoroscopy was utilized by the requesting physician.  No radiographic interpretation.   MR LUMBAR SPINE WO CONTRAST Result Date: 04/21/2023 CLINICAL DATA:  Lumbar radiculopathy. Lower back pain affecting both legs. EXAM: MRI LUMBAR SPINE WITHOUT CONTRAST TECHNIQUE: Multiplanar, multisequence MR imaging of the lumbar spine was performed. No intravenous contrast was administered. COMPARISON:  10/23/2021 FINDINGS: Segmentation:  Standard. Alignment: Straightening of lumbar lordosis with mild L3-4 anterolisthesis and levoscoliosis. Vertebrae:  No fracture, evidence of discitis, or bone lesion. Conus medullaris and cauda equina: Conus extends to the T12-L1 level. Conus and cauda equina appear normal. Paraspinal and other soft tissues: Expected postoperative scarring the levels of prior posterior decompression. Disc levels: T9-10 to T11-12: Foraminal disc bulging and facet spurring with biforaminal stenosis at T9-10 and T10-11. Disc protrusion at all 3 levels with notable ligamentum flavum thickening or epidural fat expansion at T9-10, levels partially covered. L1-L2: Disc narrowing  and mild bulging. There is endplate degeneration and mild left facet spurring. The canal and foramina are patent. L2-L3: Disc narrowing and endplate degeneration with endplate ridging and facet spurring eccentric to the right. Mild-to-moderate right foraminal narrowing. Patent spinal canal. Prior posterior decompression L3-L4: Advanced facet degeneration with bulky spurring. There is disc collapse and endplate degeneration with endplate ridging and disc bulging. Severe spinal and right foraminal stenosis. Moderate left foraminal impingement exacerbated by a upward pointing 5 mm synovial cyst from the left facet. Prior posterior decompression L4-L5: Disc collapse and endplate degeneration with circumferential ridging. Degenerative facet spurring on both sides with moderate foraminal stenosis on the right. Moderate spinal stenosis. Prior posterior decompression. L5-S1:Disc narrowing and bulging with endplate and facet spurring eccentric to the left where there is advanced foraminal impingement. IMPRESSION: 1. Advanced and generalized lumbar spine degeneration with mild L3-4 anterolisthesis and scoliosis. No significant change since 2023. 2. L3-4 severe spinal and right foraminal stenosis. Moderate left foraminal narrowing at this level partially from a synovial cyst. 3. L4-5 moderate spinal and right foraminal stenosis.  4. L5-S1 severe left foraminal stenosis. 5. Partially covered lower thoracic degeneration with foraminal stenosis. Electronically Signed   By: Tiburcio Pea M.D.   On: 04/21/2023 08:42   CT LUMBAR SPINE WO CONTRAST Result Date: 04/21/2023 CLINICAL DATA:  Lumbosacral spondylosis without myelopathy. Lumbar radiculopathy. EXAM: CT LUMBAR SPINE WITHOUT CONTRAST TECHNIQUE: Multidetector CT imaging of the lumbar spine was performed without intravenous contrast administration. Multiplanar CT image reconstructions were also generated. RADIATION DOSE REDUCTION: This exam was performed according to the  departmental dose-optimization program which includes automated exposure control, adjustment of the mA and/or kV according to patient size and/or use of iterative reconstruction technique. COMPARISON:  10/23/2021 lumbar MRI FINDINGS: Segmentation: 5 lumbar type vertebrae. Alignment: Straightening of lumbar lordosis with grade 1 anterolisthesis at L3-4 and mild levoscoliosis Vertebrae: Subjective osteopenia which is generalized. Degenerative endplate sclerosis especially at L3-4. No evidence of fracture or bone lesion. Paraspinal and other soft tissues: Atheromatous calcification of the aorta and iliacs. Disc levels: T12- L1: Unremarkable. L1-L2: Disc space narrowing with endplate and facet spurring on both sides. Moderate bilateral foraminal narrowing. L2-L3: Disc collapse with endplate ridging and facet spurring. Moderate spinal stenosis. L3-L4: Bulky degenerative facet spurring with anterolisthesis. Disc collapse and severe endplate degeneration with ridging eccentric to the right foramen. Advanced spinal and right foraminal impingement. Moderate left foraminal stenosis. L4-L5: Disc collapse with endplate and facet spurring eccentric to the right. Advanced spinal and right foraminal stenosis. Mild left foraminal narrowing. L5-S1:Disc collapse with endplate and facet spurring eccentric to the left where there is advanced foraminal impingement. Mild spinal canal narrowing. IMPRESSION: 1. Advanced and generalized lumbar spine degeneration with mild scoliosis and L3-4 listhesis. 2. Advanced stenosis at the L3-4, L4-5 spinal canal and right foramina. Advanced left foraminal stenosis at L5-S1. 3. Levels of moderate narrowing described above. Electronically Signed   By: Tiburcio Pea M.D.   On: 04/21/2023 08:34    Disposition: Discharge disposition: 01-Home or Self Care        POD #1 s/p L3/4 fusion, doing well   - up with PT/OT, encourage ambulation - Percocet for pain, Robaxin for muscle  spasms -Scripts for pain sent to pharmacy electronically  -D/C instructions sheet printed and in chart -D/C today  -F/U in office 2 weeks   Signed: Eilene Ghazi Vincent Guerra 05/06/2023, 10:23 AM

## 2023-05-06 NOTE — Telephone Encounter (Signed)
 Requested Prescriptions  Pending Prescriptions Disp Refills   fluticasone (FLONASE) 50 MCG/ACT nasal spray [Pharmacy Med Name: FLUTICASONE PROPIONATE 50 MCG/ACT N] 16 g 0    Sig: SPRAY TWICE INTO EACH NOSTRIL EVERY MORNING     Ear, Nose, and Throat: Nasal Preparations - Corticosteroids Passed - 05/06/2023 10:46 AM      Passed - Valid encounter within last 12 months    Recent Outpatient Visits           7 months ago Type 2 diabetes mellitus with hyperglycemia, without long-term current use of insulin [facility])   [place] [facility] Margarita Mail, DO   1 year ago Type 2 diabetes mellitus with hyperglycemia, without long-term current use of insulin [facility])   [place] [facility] Margarita Mail, DO   1 year ago Pre-diabetes   [place] [facility] Margarita Mail, DO   2 years ago Pre-diabetes   [facility] Margarita Mail, DO   2 years ago Pre-diabetes   [facility] Caro Laroche, DO       Future Appointments             In 1 month Margarita Mail, DO [place] [facility]

## 2023-05-19 ENCOUNTER — Telehealth: Payer: Self-pay

## 2023-05-19 DIAGNOSIS — E1165 Type 2 diabetes mellitus with hyperglycemia: Secondary | ICD-10-CM

## 2023-05-19 LAB — HEMOGLOBIN A1C
Hgb A1c MFr Bld: 5.8 %{Hb} — ABNORMAL HIGH (ref <5.7)
Mean Plasma Glucose: 120 mg/dL
eAG (mmol/L): 6.6 mmol/L

## 2023-05-19 NOTE — Telephone Encounter (Signed)
 Pt advised not due yet but he is willing to pay out of pocket.

## 2023-05-19 NOTE — Telephone Encounter (Signed)
 Copied from CRM [id number]. Topic: Clinical - Request for Lab/Test Order >> May 19, 2023 11:09 AM Franchot Heidelberg wrote: Reason for CRM: Pt wants to come in to have his A1C checked only, please advise. Wants a nurse visit, just needs the orders.

## 2023-05-27 DIAGNOSIS — L570 Actinic keratosis: Secondary | ICD-10-CM | POA: Diagnosis not present

## 2023-05-27 DIAGNOSIS — D2261 Melanocytic nevi of right upper limb, including shoulder: Secondary | ICD-10-CM | POA: Diagnosis not present

## 2023-05-27 DIAGNOSIS — D225 Melanocytic nevi of trunk: Secondary | ICD-10-CM | POA: Diagnosis not present

## 2023-05-27 DIAGNOSIS — Z85828 Personal history of other malignant neoplasm of skin: Secondary | ICD-10-CM | POA: Diagnosis not present

## 2023-05-27 DIAGNOSIS — D2272 Melanocytic nevi of left lower limb, including hip: Secondary | ICD-10-CM | POA: Diagnosis not present

## 2023-05-27 DIAGNOSIS — D2262 Melanocytic nevi of left upper limb, including shoulder: Secondary | ICD-10-CM | POA: Diagnosis not present

## 2023-05-27 DIAGNOSIS — C44519 Basal cell carcinoma of skin of other part of trunk: Secondary | ICD-10-CM | POA: Diagnosis not present

## 2023-05-27 DIAGNOSIS — Z85820 Personal history of malignant melanoma of skin: Secondary | ICD-10-CM | POA: Diagnosis not present

## 2023-05-27 DIAGNOSIS — D485 Neoplasm of uncertain behavior of skin: Secondary | ICD-10-CM | POA: Diagnosis not present

## 2023-06-02 DIAGNOSIS — M5416 Radiculopathy, lumbar region: Secondary | ICD-10-CM | POA: Diagnosis not present

## 2023-06-22 ENCOUNTER — Encounter: Payer: Self-pay | Admitting: Internal Medicine

## 2023-06-22 ENCOUNTER — Other Ambulatory Visit: Payer: Self-pay

## 2023-06-22 ENCOUNTER — Ambulatory Visit: Admitting: Internal Medicine

## 2023-06-22 VITALS — BP 124/82 | HR 88 | Temp 98.4°F | Resp 16 | Ht 70.0 in | Wt 225.0 lb

## 2023-06-22 DIAGNOSIS — E1165 Type 2 diabetes mellitus with hyperglycemia: Secondary | ICD-10-CM | POA: Diagnosis not present

## 2023-06-22 DIAGNOSIS — E782 Mixed hyperlipidemia: Secondary | ICD-10-CM

## 2023-06-22 MED ORDER — SIMVASTATIN 40 MG PO TABS
40.0000 mg | ORAL_TABLET | Freq: Every day | ORAL | 1 refills | Status: DC
Start: 2023-06-22 — End: 2023-12-24

## 2023-06-22 NOTE — Progress Notes (Signed)
 Established Patient Office Visit  Subjective   Patient ID: Vincent Guerra, male    DOB: 08/22/1950  Age: 73 y.o. MRN: [id number]  Chief Complaint  Patient presents with   Medical Management of Chronic Issues    3 month recheck    HPI  Vincent Guerra presents for follow up on chronic medical conditions. Since our LOV patient had back surgery and has a few more weeks before he sees his surgeon again.   Discussed the use of AI scribe software for clinical note transcription with the patient, who gave verbal consent to proceed.  History of Present Illness Vincent Guerra "Athena Bland" is a 73 year old male with diabetes who presents for follow-up on his A1c levels and medication management.  His recent A1c is 5.8%, down from a previous 7.4% when medication was initiated. He stopped his diabetes medication before surgery and has not resumed it, citing high health insurance costs and unaffordable medications. He has lost 15 pounds, attributing this to dietary changes and a brief period on Rybelsus . He currently weighs 235 pounds at the clinic but 216 pounds in the morning before wearing braces. He has switched back to white bread from sugar-free bread. He is not taking his cholesterol medication but has it available at home. He manages his and his partner's medications and is unsure about refill timing. He had back surgery nine weeks ago and receives steroid shots for his shoulders, experiencing manageable pain.   Diabetes, type II: -Last A1c 4/25 5.8% -Medications: nothing currently, was  on metformin  500 mg XR but could not tolerate due to abdominal side effects. Was also recently on Rybelsus  and lost 15 pounds but stopped taking it due to cost -Undergoes regular steroid injections in his shoulders now  HLD: -Medications: Simvastatin  40 mg -Patient is compliant with above medications and reports no side effects.  -Last lipid panel: Lipid Panel     Component Value Date/Time   CHOL  142 09/19/2022 1023   CHOL 183 08/18/2011 1510   TRIG 95 09/19/2022 1023   TRIG 139 08/18/2011 1510   HDL 47 09/19/2022 1023   HDL 42 08/18/2011 1510   CHOLHDL 3.0 09/19/2022 1023   VLDL 28 08/18/2011 1510   LDLCALC 77 09/19/2022 1023   LDLCALC 113 (H) 08/18/2011 1510   BPH: -Currently on Finasteride  5 mg and Flomax  0.4 mg daily.  -Last PSA 8/24 0.17, does have a family history of prostate cancer in his paternal grandfather and father - both of which were in their 38's when they were diagnosed but father lived to age 64.  GERD: -Currently on Pepcid  20 mg BID, doing well and symptoms stable.   Arthritis: -Had been on Ibuprofen , then Mobic  15 mg daily but not taking after his surgery  Allergic Rhinitis: -Currently on Claritin  and Flonase  but only once a day (causes nose bleeds if used more frequently), occasionally using Afrin at night -History of broken nose several times, deviated septum   Health Maintenance: -Blood work UTD -Colon cancer screening: Cologuard 2/23 negative   Patient Active Problem List   Diagnosis Date Noted   Spinal stenosis 04/22/2023   Type 2 diabetes mellitus with hyperglycemia, without long-term current use of insulin  (HCC) 03/23/2023   Chronic pain of both shoulders 02/07/2021   Localized osteoarthritis of both shoulder regions 02/07/2021   Family history of prostate cancer in father 09/01/2019   Leukocytosis 07/05/2019   Calculus of gallbladder without cholecystitis without obstruction 07/05/2019   Encounter for  monitoring chronic NSAID therapy 04/27/2019   Abdominal muscle strain, subsequent encounter 04/27/2019   GERD (gastroesophageal reflux disease) 08/31/2018   Allergic rhinitis 03/11/2018   Alcohol use 03/11/2018   Obesity (BMI 35.0-39.9 without comorbidity) 08/03/2017   Obesity (BMI 35.0-39.9 without comorbidity) 08/03/2017   Environmental and seasonal allergies 08/22/2016   Diverticulosis 08/22/2016   BPH with obstruction/lower urinary  tract symptoms 08/22/2016   Pre-diabetes 08/22/2016   Osteoarthritis of multiple joints 08/22/2016   Degenerative joint disease (DJD) of lumbar spine 08/22/2016   History of sciatica 08/22/2016   Hyperlipidemia 08/22/2016   BPH with obstruction/lower urinary tract symptoms 08/22/2016   Diverticulosis 08/22/2016   Environmental and seasonal allergies 08/22/2016   Osteoarthritis of multiple joints 08/22/2016   Pre-diabetes 08/22/2016   Past Medical History:  Diagnosis Date   Allergy    Arthritis    Diabetes mellitus without complication (HCC)    Diverticulosis    GERD (gastroesophageal reflux disease)    Hyperlipidemia    Past Surgical History:  Procedure Laterality Date   ANTERIOR LAT LUMBAR FUSION Left 04/22/2023   Procedure: ANTERIOR LATERAL LUMBAR FUSION 1 LEVEL;  Surgeon: Virl Grimes, MD;  Location: [facility] OR;  Service: Orthopedics;  Laterality: Left;  LEFT-SIDED LUMBAR 3- LUMBAR 4 LATERAL INTERBODY FUSION WITH INSTRUMENTATION AND ALLOGRAFT   BACK SURGERY  01/17/2015   Dr Amanda Jungling [facility]), Multiple diskectomy and herniated disc   CHOLECYSTECTOMY  07/07/2019   TOTAL KNEE ARTHROPLASTY Left 01/17/2019   Procedure: TOTAL KNEE ARTHROPLASTY;  Surgeon: Christie Cox, MD;  Location: [facility] ORS;  Service: Orthopedics;  Laterality: Left;  75 mins needed for length of case. same day discharge   VASECTOMY     Social History   Tobacco Use   Smoking status: Former    Current packs/day: 0.00    Types: Cigarettes    Quit date: 1994    Years since quitting: 31.3   Smokeless tobacco: Never  Vaping Use   Vaping status: Never Used  Substance Use Topics   Alcohol use: Yes    Alcohol/week: 24.0 standard drinks of alcohol    Types: 24 Cans of beer per week   Drug use: No   Social History   Socioeconomic History   Marital status: Married    Spouse name: Lenon Radar   Number of children: 2   Years of education: High School   Highest education level: High school graduate  Occupational  History   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, Street)  Tobacco Use   Smoking status: Former    Current packs/day: 0.00    Types: Cigarettes    Quit date: 1994    Years since quitting: 31.3   Smokeless tobacco: Never  Vaping Use   Vaping status: Never Used  Substance and Sexual Activity   Alcohol use: Yes    Alcohol/week: 24.0 standard drinks of alcohol    Types: 24 Cans of beer per week   Drug use: No   Sexual activity: Not on file  Other Topics Concern   Not on file  Social History Narrative   Not on file   Social Drivers of Health   Financial Resource Strain: Low Risk  (10/16/2020)   Overall Financial Resource Strain (CARDIA)    Difficulty of Paying Living Expenses: Not hard at all  Food Insecurity: No Food Insecurity (04/24/2023)   Hunger Vital Sign    Worried About Running Out of Food in the Last Year: Never true    Ran Out of Food in  the Last Year: Never true  Transportation Needs: No Transportation Needs (04/24/2023)   PRAPARE - Administrator, Civil Service (Medical): No    Lack of Transportation (Non-Medical): No  Physical Activity: Insufficiently Active (10/16/2020)   Exercise Vital Sign    Days of Exercise per Week: 7 days    Minutes of Exercise per Session: 20 min  Stress: No Stress Concern Present (10/16/2020)   [employer] of Occupational Health - Occupational Stress Questionnaire    Feeling of Stress : Not at all  Social Connections: Moderately Isolated (10/16/2020)   Social Connection and Isolation Panel [NHANES]    Frequency of Communication with Friends and Family: More than three times a week    Frequency of Social Gatherings with Friends and Family: Twice a week    Attends Religious Services: Never    Database administrator or Organizations: No    Attends Banker Meetings: Never    Marital Status: Married  Catering manager Violence: Not At Risk (04/24/2023)   Humiliation, Afraid, Rape, and Kick questionnaire     Fear of Current or Ex-Partner: No    Emotionally Abused: No    Physically Abused: No    Sexually Abused: No   Family Status  Relation Name Status   Mother  Deceased   Father  Deceased   Brother  Alive   Brother  Alive  No partnership data on file   Family History  Problem Relation Age of Onset   Heart disease Mother    Alzheimer's disease Mother    Heart attack Mother    Prostate cancer Father 11   Heart disease Brother 59       CABG   Heart disease Brother        CABG   Allergies  Allergen Reactions   Metformin  And Related Diarrhea    Even extended release Leg cramps   Metronidazole Hives   Tetanus Toxoid Adsorbed Hives      Review of Systems  All other systems reviewed and are negative.     Objective:     BP 124/82 (Cuff Size: Large)   Pulse 88   Temp 98.4 F (36.9 C) (Oral)   Resp 16   Ht 5\' 10"  (1.778 m)   Wt 225 lb (102.1 kg)   SpO2 97%   BMI 32.28 kg/m  BP Readings from Last 3 Encounters:  06/22/23 124/82  04/23/23 108/75  04/20/23 (!) 136/96   Wt Readings from Last 3 Encounters:  06/22/23 225 lb (102.1 kg)  04/22/23 225 lb 6.4 oz (102.2 kg)  03/23/23 235 lb (106.6 kg)      Physical Exam Constitutional:      Appearance: Normal appearance.  HENT:     Head: Normocephalic and atraumatic.  Eyes:     Conjunctiva/sclera: Conjunctivae normal.  Cardiovascular:     Rate and Rhythm: Normal rate and regular rhythm.  Pulmonary:     Effort: Pulmonary effort is normal.     Breath sounds: Normal breath sounds.  Skin:    General: Skin is warm and dry.  Neurological:     General: No focal deficit present.     Mental Status: He is alert. Mental status is at baseline.  Psychiatric:        Mood and Affect: Mood normal.        Behavior: Behavior normal.      No results found for any visits on 06/22/23.   Last CBC Lab Results  Component  Value Date   WBC 9.5 04/20/2023   HGB 14.4 04/20/2023   HCT 42.7 04/20/2023   MCV 92.4 04/20/2023    MCH 31.2 04/20/2023   RDW 13.7 04/20/2023   PLT 230 04/20/2023   Last metabolic panel Lab Results  Component Value Date   GLUCOSE 146 (H) 04/20/2023   NA 141 04/20/2023   K 3.9 04/20/2023   CL 107 04/20/2023   CO2 24 04/20/2023   BUN 13 04/20/2023   CREATININE 0.91 04/20/2023   EGFR 77 09/19/2022   CALCIUM 9.4 04/20/2023   PROT 6.8 09/19/2022   ALBUMIN 4.3 07/04/2019   BILITOT 0.4 09/19/2022   ALKPHOS 49 07/04/2019   AST 20 09/19/2022   ALT 28 09/19/2022   ANIONGAP 10 04/20/2023   Last lipids Lab Results  Component Value Date   CHOL 142 09/19/2022   HDL 47 09/19/2022   LDLCALC 77 09/19/2022   TRIG 95 09/19/2022   CHOLHDL 3.0 09/19/2022   Last hemoglobin A1c Lab Results  Component Value Date   HGBA1C 5.8 (H) 05/19/2023   Last thyroid functions Lab Results  Component Value Date   TSH 2.21 11/11/2016   Last vitamin D No results found for: "25OHVITD2", "25OHVITD3", "VD25OH" Last vitamin B12 and Folate No results found for: "VITAMINB12", "FOLATE"    The 10-year ASCVD risk score (Arnett DK, et al., 2019) is: 28.8%    Assessment & Plan:   Assessment & Plan Postoperative pain Pain persists nine weeks post-surgery, not severe enough for immediate intervention. Unable to take recommended medications. - Follow surgeon's recommendations for medication use post-surgery.  Type 2 diabetes mellitus A1c improved to 5.8% with lifestyle changes. Rybelsus  discontinued due to cost. Metformin  previously stopped due to side effects. Glipizide not recommended due to hypoglycemia risk. - Monitor A1c levels every four months. - Consult pharmacist for cost-effective medication options for diabetes management. - Schedule follow-up for physical and labs in August.  HLD - Stable, refill statin and plan to recheck lipid panel at follow up.   - simvastatin  (ZOCOR ) 40 MG tablet; Take 1 tablet (40 mg total) by mouth at bedtime.  Dispense: 90 tablet; Refill: 1    Return in  about 6 months (around 12/23/2023).    Rockney Cid, DO

## 2023-06-29 ENCOUNTER — Ambulatory Visit: Payer: Medicare Other | Admitting: Internal Medicine

## 2023-07-13 DIAGNOSIS — M5416 Radiculopathy, lumbar region: Secondary | ICD-10-CM | POA: Diagnosis not present

## 2023-07-14 ENCOUNTER — Other Ambulatory Visit: Payer: Self-pay | Admitting: Internal Medicine

## 2023-07-14 DIAGNOSIS — N401 Enlarged prostate with lower urinary tract symptoms: Secondary | ICD-10-CM

## 2023-07-15 NOTE — Telephone Encounter (Signed)
 Requested Prescriptions  Pending Prescriptions Disp Refills   finasteride  (PROSCAR ) 5 MG tablet [Pharmacy Med Name: FINASTERIDE  5 MG TAB] 90 tablet 0    Sig: TAKE 1 TABLET BY MOUTH ONCE DAILY     Urology: 5-alpha Reductase Inhibitors Passed - 07/15/2023  2:23 PM      Passed - PSA in normal range and within 360 days    PSA  Date Value Ref Range Status  09/19/2022 0.17 < OR = 4.00 ng/mL Final    Comment:    The total PSA value from this assay system is  standardized against the WHO standard. The test  result will be approximately 20% lower when compared  to the equimolar-standardized total PSA (Beckman  Coulter). Comparison of serial PSA results should be  interpreted with this fact in mind. . This test was performed using the Siemens  chemiluminescent method. Values obtained from  different assay methods cannot be used interchangeably. PSA levels, regardless of value, should not be interpreted as absolute evidence of the presence or absence of disease.          Passed - Valid encounter within last 12 months    Recent Outpatient Visits           3 weeks ago Type 2 diabetes mellitus with hyperglycemia, without long-term current use of insulin  [facility])   [facility] Rockney Cid, DO   3 months ago Type 2 diabetes mellitus with hyperglycemia, without long-term current use of insulin  [facility])   [facility] Rockney Cid, [place]

## 2023-07-24 ENCOUNTER — Other Ambulatory Visit: Payer: Self-pay | Admitting: Internal Medicine

## 2023-07-27 NOTE — Telephone Encounter (Signed)
 Requested Prescriptions  Pending Prescriptions Disp Refills   fluticasone  (FLONASE ) 50 MCG/ACT nasal spray [Pharmacy Med Name: FLUTICASONE  PROPIONATE 50 MCG/ACT N] 16 g 2    Sig: SPRAY TWICE INTO EACH NOSTRIL EVERY MORNING     Ear, Nose, and Throat: Nasal Preparations - Corticosteroids Passed - 07/27/2023  4:32 PM      Passed - Valid encounter within last 12 months    Recent Outpatient Visits           1 month ago Type 2 diabetes mellitus with hyperglycemia, without long-term current use of insulin  [facility])   [place] [facility] Rockney Cid, DO   4 months ago Type 2 diabetes mellitus with hyperglycemia, without long-term current use of insulin  [facility])   [facility] Rock[place] Cid, [place]

## 2023-08-04 DIAGNOSIS — C44519 Basal cell carcinoma of skin of other part of trunk: Secondary | ICD-10-CM | POA: Diagnosis not present

## 2023-08-19 DIAGNOSIS — H02881 Meibomian gland dysfunction right upper eyelid: Secondary | ICD-10-CM | POA: Diagnosis not present

## 2023-08-19 DIAGNOSIS — H02885 Meibomian gland dysfunction left lower eyelid: Secondary | ICD-10-CM | POA: Diagnosis not present

## 2023-08-19 DIAGNOSIS — H2513 Age-related nuclear cataract, bilateral: Secondary | ICD-10-CM | POA: Diagnosis not present

## 2023-09-14 DIAGNOSIS — H2511 Age-related nuclear cataract, right eye: Secondary | ICD-10-CM | POA: Diagnosis not present

## 2023-09-14 DIAGNOSIS — H2513 Age-related nuclear cataract, bilateral: Secondary | ICD-10-CM | POA: Diagnosis not present

## 2023-09-14 DIAGNOSIS — H25013 Cortical age-related cataract, bilateral: Secondary | ICD-10-CM | POA: Diagnosis not present

## 2023-09-14 DIAGNOSIS — H25043 Posterior subcapsular polar age-related cataract, bilateral: Secondary | ICD-10-CM | POA: Diagnosis not present

## 2023-09-21 ENCOUNTER — Telehealth: Payer: Self-pay | Admitting: Pharmacy Technician

## 2023-09-21 ENCOUNTER — Other Ambulatory Visit (HOSPITAL_COMMUNITY): Payer: Self-pay

## 2023-09-21 NOTE — Telephone Encounter (Signed)
 Pharmacy Patient Advocate Encounter  Received notification from [employer] that Prior Authorization for Rybelsus  3MG  tablets has been APPROVED from 09/21/23 to 02/09/2098. Ran test claim, Copay is $517.04. This test claim was processed through [facility]- copay amounts may vary at other pharmacies due to pharmacy/plan contracts, or as the patient moves through the different stages of their insurance plan.   PA #/Case ID/Reference #: [id number]

## 2023-09-21 NOTE — Telephone Encounter (Signed)
 Pharmacy Patient Advocate Encounter   Received notification from CoverMyMeds that prior authorization for Rybelsus  3MG  tablets is required/requested.   Insurance verification completed.   The patient is insured through [facility] .   Per test claim: PA required; PA submitted to above mentioned insurance via CoverMyMeds Key/confirmation #/EOC [id number] Status is pending

## 2023-09-23 ENCOUNTER — Other Ambulatory Visit (HOSPITAL_COMMUNITY): Payer: Self-pay

## 2023-10-14 ENCOUNTER — Other Ambulatory Visit: Payer: Self-pay | Admitting: Internal Medicine

## 2023-10-14 DIAGNOSIS — K219 Gastro-esophageal reflux disease without esophagitis: Secondary | ICD-10-CM

## 2023-10-14 DIAGNOSIS — N138 Other obstructive and reflux uropathy: Secondary | ICD-10-CM

## 2023-10-14 NOTE — Telephone Encounter (Signed)
 Requested Prescriptions  Pending Prescriptions Disp Refills   famotidine  (PEPCID ) 20 MG tablet [Pharmacy Med Name: FAMOTIDINE  20 MG TAB] 180 tablet 0    Sig: Take 1 tablet (20 mg total) by mouth 2 (two) times daily. FOR HEARTBURN OR INDIGESTION     Gastroenterology:  H2 Antagonists Passed - 10/14/2023  4:06 PM      Passed - Valid encounter within last 12 months    Recent Outpatient Visits           3 months ago Type 2 diabetes mellitus with hyperglycemia, without long-term current use of insulin  [facility])   [place] [facility] Bernardo Fend, DO   6 months ago Type 2 diabetes mellitus with hyperglycemia, without long-term current use of insulin  [facility])   [place] [facility] Bernardo Fend, DO               finasteride  (PROSCAR ) 5 MG tablet [Pharmacy Med Name: FINASTERIDE  5 MG TAB] 90 tablet 0    Sig: TAKE 1 TABLET BY MOUTH ONCE DAILY     Urology: 5-alpha Reductase Inhibitors Failed - 10/14/2023  4:06 PM      Failed - PSA in normal range and within 360 days    PSA  Date Value Ref Range Status  09/19/2022 0.17 < OR = 4.00 ng/mL Final    Comment:    The total PSA value from this assay system is  standardized against the WHO standard. The test  result will be approximately 20% lower when compared  to the equimolar-standardized total PSA (Beckman  Coulter). Comparison of serial PSA results should be  interpreted with this fact in mind. . This test was performed using the Siemens  chemiluminescent method. Values obtained from  different assay methods cannot be used interchangeably. PSA levels, regardless of value, should not be interpreted as absolute evidence of the presence or absence of disease.          Passed - Valid encounter within last 12 months    Recent Outpatient Visits           3 months ago Type 2 diabetes mellitus with hyperglycemia, without long-term current use of insulin  [facility])   [facility] [address]  Bernardo Fend, DO   6 months ago Type 2 diabetes mellitus with hyperglycemia, without long-term current use of insulin  [facility])   [facility] Bernardo Fend, [place]

## 2023-11-13 DIAGNOSIS — H2511 Age-related nuclear cataract, right eye: Secondary | ICD-10-CM | POA: Diagnosis not present

## 2023-11-13 DIAGNOSIS — H2513 Age-related nuclear cataract, bilateral: Secondary | ICD-10-CM | POA: Diagnosis not present

## 2023-12-01 ENCOUNTER — Encounter: Payer: Self-pay | Admitting: Internal Medicine

## 2023-12-17 DIAGNOSIS — H2511 Age-related nuclear cataract, right eye: Secondary | ICD-10-CM | POA: Diagnosis not present

## 2023-12-17 DIAGNOSIS — H5371 Glare sensitivity: Secondary | ICD-10-CM | POA: Diagnosis not present

## 2023-12-18 DIAGNOSIS — H25012 Cortical age-related cataract, left eye: Secondary | ICD-10-CM | POA: Diagnosis not present

## 2023-12-18 DIAGNOSIS — H25042 Posterior subcapsular polar age-related cataract, left eye: Secondary | ICD-10-CM | POA: Diagnosis not present

## 2023-12-18 DIAGNOSIS — H2512 Age-related nuclear cataract, left eye: Secondary | ICD-10-CM | POA: Diagnosis not present

## 2023-12-24 ENCOUNTER — Encounter: Payer: Self-pay | Admitting: Internal Medicine

## 2023-12-24 ENCOUNTER — Ambulatory Visit: Admitting: Internal Medicine

## 2023-12-24 VITALS — BP 116/72 | HR 81 | Temp 98.1°F | Resp 16 | Ht 70.0 in | Wt 222.0 lb

## 2023-12-24 DIAGNOSIS — Z125 Encounter for screening for malignant neoplasm of prostate: Secondary | ICD-10-CM | POA: Diagnosis not present

## 2023-12-24 DIAGNOSIS — R10A2 Flank pain, left side: Secondary | ICD-10-CM

## 2023-12-24 DIAGNOSIS — K219 Gastro-esophageal reflux disease without esophagitis: Secondary | ICD-10-CM

## 2023-12-24 DIAGNOSIS — Z7984 Long term (current) use of oral hypoglycemic drugs: Secondary | ICD-10-CM | POA: Diagnosis not present

## 2023-12-24 DIAGNOSIS — N138 Other obstructive and reflux uropathy: Secondary | ICD-10-CM | POA: Diagnosis not present

## 2023-12-24 DIAGNOSIS — N401 Enlarged prostate with lower urinary tract symptoms: Secondary | ICD-10-CM

## 2023-12-24 DIAGNOSIS — Z23 Encounter for immunization: Secondary | ICD-10-CM

## 2023-12-24 DIAGNOSIS — E782 Mixed hyperlipidemia: Secondary | ICD-10-CM | POA: Diagnosis not present

## 2023-12-24 DIAGNOSIS — E1165 Type 2 diabetes mellitus with hyperglycemia: Secondary | ICD-10-CM | POA: Diagnosis not present

## 2023-12-24 DIAGNOSIS — J301 Allergic rhinitis due to pollen: Secondary | ICD-10-CM

## 2023-12-24 LAB — POCT URINALYSIS DIPSTICK
Bilirubin, UA: NEGATIVE
Blood, UA: NEGATIVE
Glucose, UA: NEGATIVE
Ketones, UA: NEGATIVE
Leukocytes, UA: NEGATIVE
Nitrite, UA: NEGATIVE
Protein, UA: NEGATIVE
Spec Grav, UA: 1.02 (ref 1.010–1.025)
Urobilinogen, UA: 0.2 U/dL
pH, UA: 5 (ref 5.0–8.0)

## 2023-12-24 LAB — POCT GLYCOSYLATED HEMOGLOBIN (HGB A1C): Hemoglobin A1C: 5.4 % (ref 4.0–5.6)

## 2023-12-24 MED ORDER — TAMSULOSIN HCL 0.4 MG PO CAPS: 0.4000 mg | ORAL_CAPSULE | Freq: Every day | ORAL | 1 refills | Status: AC

## 2023-12-24 MED ORDER — FAMOTIDINE 20 MG PO TABS: 20.0000 mg | ORAL_TABLET | Freq: Two times a day (BID) | ORAL | 1 refills | Status: AC

## 2023-12-24 MED ORDER — SIMVASTATIN 40 MG PO TABS: 40.0000 mg | ORAL_TABLET | Freq: Every day | ORAL | 1 refills | Status: AC

## 2023-12-24 MED ORDER — FINASTERIDE 5 MG PO TABS: 5.0000 mg | ORAL_TABLET | Freq: Every day | ORAL | 1 refills | Status: AC

## 2023-12-24 NOTE — Progress Notes (Signed)
 Established Patient Office Visit  Subjective   Patient ID: Vincent Guerra, male    DOB: 05/09/1950  Age: 73 y.o. MRN: [id number]  Chief Complaint  Patient presents with   Medical Management of Chronic Issues    HPI  Vincent Guerra presents for follow up on chronic medical conditions.   Discussed the use of AI scribe software for clinical note transcription with the patient, who gave verbal consent to proceed.  History of Present Illness  Vincent Guerra is a 73 year old male who presents with left-sided back pain.  He experiences intermittent left-sided back pain near the kidney area, exacerbated by movement and certain sleeping positions, and improves during the day. He underwent back surgery in March for a deteriorated disc and bone spur on the sciatic nerve, which resolved his sciatica symptoms. There is no radiation of the back pain into the groin.  He underwent cataract surgery last week and is scheduled for another procedure on the opposite eye next week. He has adapted his glasses by replacing one lens to accommodate the change in vision post-surgery.  His medications include Flomax , finasteride , Pepcid , and a cholesterol medication, all due for refill. He also uses Flonase  and loratadine  for allergies, with increased use of Flonase  in the fall due to dryness and allergens.  He has a history of elevated A1c levels, with the most recent reading being 5.4. He also has a history of slow wound healing due to rheumatologic medications affecting his immune system.   Diabetes, type II: -Last A1c 4/25 5.8% -Medications: nothing currently, was  on metformin  500 mg XR but could not tolerate due to abdominal side effects. Was also recently on Rybelsus  and lost 15 pounds but stopped taking it due to cost -Had been undergoing regular steroid injections for shoulders and back but hasn't had recently  HLD: -Medications: Simvastatin  40 mg -Patient is compliant with  above medications and reports no side effects.  -Last lipid panel: Lipid Panel     Component Value Date/Time   CHOL 142 09/19/2022 1023   CHOL 183 08/18/2011 1510   TRIG 95 09/19/2022 1023   TRIG 139 08/18/2011 1510   HDL 47 09/19/2022 1023   HDL 42 08/18/2011 1510   CHOLHDL 3.0 09/19/2022 1023   VLDL 28 08/18/2011 1510   LDLCALC 77 09/19/2022 1023   LDLCALC 113 (H) 08/18/2011 1510   BPH: -Currently on Finasteride  5 mg and Flomax  0.4 mg daily.  -Last PSA 8/24 0.17, does have a family history of prostate cancer in his paternal grandfather and father - both of which were in their 53's when they were diagnosed but father lived to age 65.  GERD: -Currently on Pepcid  20 mg BID, doing well and symptoms stable.   Allergic Rhinitis: -Currently on Claritin  and Flonase  but only once a day (causes nose bleeds if used more frequently), occasionally using Afrin at night -History of broken nose several times, deviated septum   Health Maintenance: -Blood work due -Colon cancer screening: Cologuard 2/23 negative  -Flu vaccine today  Patient Active Problem List   Diagnosis Date Noted   Spinal stenosis 04/22/2023   Type 2 diabetes mellitus with hyperglycemia, without long-term current use of insulin  (HCC) 03/23/2023   Chronic pain of both shoulders 02/07/2021   Localized osteoarthritis of both shoulder regions 02/07/2021   Family history of prostate cancer in father 09/01/2019   Leukocytosis 07/05/2019   Calculus of gallbladder without cholecystitis without obstruction 07/05/2019   Encounter for  monitoring chronic NSAID therapy 04/27/2019   Abdominal muscle strain, subsequent encounter 04/27/2019   GERD (gastroesophageal reflux disease) 08/31/2018   Allergic rhinitis 03/11/2018   Alcohol use 03/11/2018   Obesity (BMI 35.0-39.9 without comorbidity) 08/03/2017   Obesity (BMI 35.0-39.9 without comorbidity) 08/03/2017   Environmental and seasonal allergies 08/22/2016   Diverticulosis  08/22/2016   BPH with obstruction/lower urinary tract symptoms 08/22/2016   Pre-diabetes 08/22/2016   Osteoarthritis of multiple joints 08/22/2016   Degenerative joint disease (DJD) of lumbar spine 08/22/2016   History of sciatica 08/22/2016   Hyperlipidemia 08/22/2016   BPH with obstruction/lower urinary tract symptoms 08/22/2016   Diverticulosis 08/22/2016   Environmental and seasonal allergies 08/22/2016   Osteoarthritis of multiple joints 08/22/2016   Pre-diabetes 08/22/2016   Past Medical History:  Diagnosis Date   Allergy    Arthritis    Diabetes mellitus without complication (HCC)    Diverticulosis    GERD (gastroesophageal reflux disease)    Hyperlipidemia    Past Surgical History:  Procedure Laterality Date   ANTERIOR LAT LUMBAR FUSION Left 04/22/2023   Procedure: ANTERIOR LATERAL LUMBAR FUSION 1 LEVEL;  Surgeon: Beuford Anes, MD;  Location: [facility] OR;  Service: Orthopedics;  Laterality: Left;  LEFT-SIDED LUMBAR 3- LUMBAR 4 LATERAL INTERBODY FUSION WITH INSTRUMENTATION AND ALLOGRAFT   BACK SURGERY  01/17/2015   Dr Al[facility]), Multiple diskectomy and herniated disc   CHOLECYSTECTOMY  07/07/2019   TOTAL KNEE ARTHROPLASTY Left 01/17/2019   Procedure: TOTAL KNEE ARTHROPLASTY;  Surgeon: Rubie Kemps, MD;  Location: [facility] ORS;  Service: Orthopedics;  Laterality: Left;  75 mins needed for length of case. same day discharge   VASECTOMY     Social History   Tobacco Use   Smoking status: Former    Current packs/day: 0.00    Types: Cigarettes    Quit date: 1994    Years since quitting: 31.8   Smokeless tobacco: Never  Vaping Use   Vaping status: Never Used  Substance Use Topics   Alcohol use: Yes    Alcohol/week: 24.0 standard drinks of alcohol    Types: 24 Cans of beer per week   Drug use: No   Social History   Socioeconomic History   Marital status: Married    Spouse name: Merlynn   Number of children: 2   Years of education: High School   Highest education  level: High school graduate  Occupational History   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, Street)  Tobacco Use   Smoking status: Former    Current packs/day: 0.00    Types: Cigarettes    Quit date: 1994    Years since quitting: 31.8   Smokeless tobacco: Never  Vaping Use   Vaping status: Never Used  Substance and Sexual Activity   Alcohol use: Yes    Alcohol/week: 24.0 standard drinks of alcohol    Types: 24 Cans of beer per week   Drug use: No   Sexual activity: Not on file  Other Topics Concern   Not on file  Social History Narrative   Not on file   Social Drivers of Health   Financial Resource Strain: Low Risk  (10/16/2020)   Overall Financial Resource Strain (CARDIA)    Difficulty of Paying Living Expenses: Not hard at all  Food Insecurity: No Food Insecurity (04/24/2023)   Hunger Vital Sign    Worried About Running Out of Food in the Last Year: Never true    Ran Out of Food in  the Last Year: Never true  Transportation Needs: No Transportation Needs (04/24/2023)   PRAPARE - Administrator, Civil Service (Medical): No    Lack of Transportation (Non-Medical): No  Physical Activity: Insufficiently Active (10/16/2020)   Exercise Vital Sign    Days of Exercise per Week: 7 days    Minutes of Exercise per Session: 20 min  Stress: No Stress Concern Present (10/16/2020)   [employer] of Occupational Health - Occupational Stress Questionnaire    Feeling of Stress : Not at all  Social Connections: Moderately Isolated (10/16/2020)   Social Connection and Isolation Panel    Frequency of Communication with Friends and Family: More than three times a week    Frequency of Social Gatherings with Friends and Family: Twice a week    Attends Religious Services: Never    Database Administrator or Organizations: No    Attends Banker Meetings: Never    Marital Status: Married  Catering Manager Violence: Not At Risk (04/24/2023)   Humiliation,  Afraid, Rape, and Kick questionnaire    Fear of Current or Ex-Partner: No    Emotionally Abused: No    Physically Abused: No    Sexually Abused: No   Family Status  Relation Name Status   Mother  Deceased   Father  Deceased   Brother  Alive   Brother  Alive  No partnership data on file   Family History  Problem Relation Age of Onset   Heart disease Mother    Alzheimer's disease Mother    Heart attack Mother    Prostate cancer Father 5   Heart disease Brother 28       CABG   Heart disease Brother        CABG   Allergies  Allergen Reactions   Metformin  And Related Diarrhea    Even extended release Leg cramps   Metronidazole Hives   Tetanus Toxoid Adsorbed Hives      Review of Systems  All other systems reviewed and are negative.     Objective:     BP 116/72   Pulse 81   Temp 98.1 F (36.7 C)   Resp 16   Ht 5' 10 (1.778 m)   Wt 222 lb (100.7 kg)   SpO2 98%   BMI 31.85 kg/m  BP Readings from Last 3 Encounters:  12/24/23 116/72  06/22/23 124/82  04/23/23 108/75   Wt Readings from Last 3 Encounters:  12/24/23 222 lb (100.7 kg)  06/22/23 225 lb (102.1 kg)  04/22/23 225 lb 6.4 oz (102.2 kg)      Physical Exam Constitutional:      Appearance: Normal appearance.  HENT:     Head: Normocephalic and atraumatic.  Eyes:     Conjunctiva/sclera: Conjunctivae normal.  Cardiovascular:     Rate and Rhythm: Normal rate and regular rhythm.     Pulses:          Dorsalis pedis pulses are 2+ on the right side and 2+ on the left side.  Pulmonary:     Effort: Pulmonary effort is normal.     Breath sounds: Normal breath sounds.  Musculoskeletal:     Right foot: Normal range of motion. No deformity, bunion, Charcot foot, foot drop or prominent metatarsal heads.     Left foot: Normal range of motion. No deformity, bunion, Charcot foot, foot drop or prominent metatarsal heads.  Feet:     Right foot:     Protective Sensation:  6 sites tested.  6 sites sensed.      Skin integrity: Skin integrity normal.     Toenail Condition: Right toenails are normal.     Left foot:     Protective Sensation: 6 sites tested.  6 sites sensed.     Skin integrity: Skin integrity normal.     Toenail Condition: Left toenails are normal.  Skin:    General: Skin is warm and dry.  Neurological:     General: No focal deficit present.     Mental Status: He is alert. Mental status is at baseline.  Psychiatric:        Mood and Affect: Mood normal.        Behavior: Behavior normal.      No results found for any visits on 12/24/23.   Last CBC Lab Results  Component Value Date   WBC 9.5 04/20/2023   HGB 14.4 04/20/2023   HCT 42.7 04/20/2023   MCV 92.4 04/20/2023   MCH 31.2 04/20/2023   RDW 13.7 04/20/2023   PLT 230 04/20/2023   Last metabolic panel Lab Results  Component Value Date   GLUCOSE 146 (H) 04/20/2023   NA 141 04/20/2023   K 3.9 04/20/2023   CL 107 04/20/2023   CO2 24 04/20/2023   BUN 13 04/20/2023   CREATININE 0.91 04/20/2023   EGFR 77 09/19/2022   CALCIUM 9.4 04/20/2023   PROT 6.8 09/19/2022   ALBUMIN 4.3 07/04/2019   BILITOT 0.4 09/19/2022   ALKPHOS 49 07/04/2019   AST 20 09/19/2022   ALT 28 09/19/2022   ANIONGAP 10 04/20/2023   Last lipids Lab Results  Component Value Date   CHOL 142 09/19/2022   HDL 47 09/19/2022   LDLCALC 77 09/19/2022   TRIG 95 09/19/2022   CHOLHDL 3.0 09/19/2022   Last hemoglobin A1c Lab Results  Component Value Date   HGBA1C 5.8 (H) 05/19/2023   Last thyroid functions Lab Results  Component Value Date   TSH 2.21 11/11/2016   Last vitamin D No results found for: 25OHVITD2, 25OHVITD3, VD25OH Last vitamin B12 and Folate No results found for: VITAMINB12, FOLATE    The 10-year ASCVD risk score (Arnett DK, et al., 2019) is: 28%    Assessment & Plan:   Assessment & Plan  Mixed hyperlipidemia Cholesterol medication was due for renewal. - Renewed cholesterol medication prescription. -  Recheck cholesterol panel.   Type 2 diabetes mellitus with hyperglycemia A1c improved from 7.4 to 5.4, indicating good glycemic control. Annual foot exam necessary to screen for neuropathy. - Performed annual foot exam to screen for neuropathy.  Benign prostatic hyperplasia with lower urinary tract symptoms Flomax  and finasteride  prescriptions were due for renewal. - Renewed Flomax  prescription. - Renewed finasteride  prescription.  Left-sided musculoskeletal back pain Intermittent left-sided back pain, likely musculoskeletal. Differential includes musculoskeletal pain versus renal pathology. - Ordered urine test to check for blood or other abnormalities which was negative.   Allergic rhinitis Symptoms include morning drainage, likely due to sinus pooling during sleep. Flonase  and nasal saline recommended for relief. - Recommended use of Flonase  or nasal saline in the morning to alleviate symptoms.  GERD Symptoms stable, refill Pepcid .   Encounter for immunization Flu shot administered today. - Confirmed flu shot administration.  - POCT HgB A1C - HM Diabetes Foot Exam - CBC w/Diff/Platelet - Comprehensive Metabolic Panel (CMET) - Flu vaccine HIGH DOSE PF(Fluzone Trivalent) - Lipid Profile - simvastatin  (ZOCOR ) 40 MG tablet; Take 1 tablet (40 mg total) by  mouth at bedtime.  Dispense: 90 tablet; Refill: 1 - PSA - finasteride  (PROSCAR ) 5 MG tablet; Take 1 tablet (5 mg total) by mouth daily.  Dispense: 90 tablet; Refill: 1 - tamsulosin  (FLOMAX ) 0.4 MG CAPS capsule; Take 1 capsule (0.4 mg total) by mouth daily.  Dispense: 90 capsule; Refill: 1 - PSA - famotidine  (PEPCID ) 20 MG tablet; Take 1 tablet (20 mg total) by mouth 2 (two) times daily. FOR HEARTBURN OR INDIGESTION  Dispense: 180 tablet; Refill: 1 - POCT Urinalysis Dipstick   Return in about 6 months (around 06/22/2024).    Sharyle Fischer, DO

## 2023-12-25 LAB — CBC WITH DIFFERENTIAL/PLATELET
Absolute Lymphocytes: 1878 {cells}/uL (ref 850–3900)
Absolute Monocytes: 681 {cells}/uL (ref 200–950)
Basophils Absolute: 57 {cells}/uL (ref 0–200)
Basophils Relative: 0.7 %
Eosinophils Absolute: 139 {cells}/uL (ref 15–500)
Eosinophils Relative: 1.7 %
HCT: 44 % (ref 38.5–50.0)
Hemoglobin: 14.6 g/dL (ref 13.2–17.1)
MCH: 30.2 pg (ref 27.0–33.0)
MCHC: 33.2 g/dL (ref 32.0–36.0)
MCV: 90.9 fL (ref 80.0–100.0)
MPV: 9.8 fL (ref 7.5–12.5)
Monocytes Relative: 8.3 %
Neutro Abs: 5445 {cells}/uL (ref 1500–7800)
Neutrophils Relative %: 66.4 %
Platelets: 209 Thousand/uL (ref 140–400)
RBC: 4.84 Million/uL (ref 4.20–5.80)
RDW: 13.2 % (ref 11.0–15.0)
Total Lymphocyte: 22.9 %
WBC: 8.2 Thousand/uL (ref 3.8–10.8)

## 2023-12-25 LAB — LIPID PANEL
Cholesterol: 137 mg/dL (ref <200)
HDL: 56 mg/dL (ref >=40)
LDL Cholesterol (Calc): 67 mg/dL
Non-HDL Cholesterol (Calc): 81 mg/dL (ref <130)
Total CHOL/HDL Ratio: 2.4 (calc) (ref <5.0)
Triglycerides: 65 mg/dL (ref <150)

## 2023-12-25 LAB — COMPREHENSIVE METABOLIC PANEL WITH GFR
AG Ratio: 2 (calc) (ref 1.0–2.5)
ALT: 23 U/L (ref 9–46)
AST: 19 U/L (ref 10–35)
Albumin: 4.6 g/dL (ref 3.6–5.1)
Alkaline phosphatase (APISO): 57 U/L (ref 35–144)
BUN: 15 mg/dL (ref 7–25)
CO2: 25 mmol/L (ref 20–32)
Calcium: 9.3 mg/dL (ref 8.6–10.3)
Chloride: 107 mmol/L (ref 98–110)
Creat: 0.88 mg/dL (ref 0.70–1.28)
Globulin: 2.3 g/dL (ref 1.9–3.7)
Glucose, Bld: 97 mg/dL (ref 65–99)
Potassium: 4.6 mmol/L (ref 3.5–5.3)
Sodium: 141 mmol/L (ref 135–146)
Total Bilirubin: 0.6 mg/dL (ref 0.2–1.2)
Total Protein: 6.9 g/dL (ref 6.1–8.1)
eGFR: 91 mL/min/1.73m2 (ref >=60)

## 2023-12-25 LAB — PSA: PSA: 0.19 ng/mL (ref <=4.00)

## 2023-12-28 ENCOUNTER — Ambulatory Visit: Payer: Self-pay | Admitting: Internal Medicine

## 2023-12-29 ENCOUNTER — Other Ambulatory Visit: Payer: Self-pay | Admitting: Internal Medicine

## 2023-12-29 DIAGNOSIS — M150 Primary generalized (osteo)arthritis: Secondary | ICD-10-CM

## 2023-12-31 DIAGNOSIS — H25042 Posterior subcapsular polar age-related cataract, left eye: Secondary | ICD-10-CM | POA: Diagnosis not present

## 2023-12-31 DIAGNOSIS — H25012 Cortical age-related cataract, left eye: Secondary | ICD-10-CM | POA: Diagnosis not present

## 2023-12-31 DIAGNOSIS — H2512 Age-related nuclear cataract, left eye: Secondary | ICD-10-CM | POA: Diagnosis not present

## 2023-12-31 NOTE — Telephone Encounter (Signed)
 Discontinued 04/22/23.  Requested Prescriptions  Pending Prescriptions Disp Refills   meloxicam  (MOBIC ) 15 MG tablet [Pharmacy Med Name: MELOXICAM  15 MG TAB] 90 tablet 1    Sig: TAKE ONE TABLET BY MOUTH ONCE DAILY     Analgesics:  COX2 Inhibitors Failed - 12/31/2023  4:35 PM      Failed - Manual Review: Labs are only required if the patient has taken medication for more than 8 weeks.      Passed - HGB in normal range and within 360 days    Hemoglobin  Date Value Ref Range Status  12/24/2023 14.6 13.2 - 17.1 g/dL Final   HGB  Date Value Ref Range Status  08/18/2011 14.8 13.0 - 18.0 g/dL Final         Passed - Cr in normal range and within 360 days    Creat  Date Value Ref Range Status  12/24/2023 0.88 0.70 - 1.28 mg/dL Final   Creatinine, Urine  Date Value Ref Range Status  03/23/2023 102 20 - 320 mg/dL Final         Passed - HCT in normal range and within 360 days    HCT  Date Value Ref Range Status  12/24/2023 44.0 38.5 - 50.0 % Final  08/18/2011 43.7 40.0 - 52.0 % Final         Passed - AST in normal range and within 360 days    AST  Date Value Ref Range Status  12/24/2023 19 10 - 35 U/L Final   SGOT(AST)  Date Value Ref Range Status  08/18/2011 17 15 - 37 Unit/L Final         Passed - ALT in normal range and within 360 days    ALT  Date Value Ref Range Status  12/24/2023 23 9 - 46 U/L Final   SGPT (ALT)  Date Value Ref Range Status  08/18/2011 38 U/L Final    Comment:    12-78 NOTE: NEW REFERENCE RANGE 01/03/2011          Passed - eGFR is 30 or above and within 360 days    GFR, Est African American  Date Value Ref Range Status  09/01/2019 104 > OR = 60 mL/min/1.69m2 Final   GFR, Est Non African American  Date Value Ref Range Status  09/01/2019 89 > OR = 60 mL/min/1.66m2 Final   GFR, Estimated  Date Value Ref Range Status  04/20/2023 >60 >60 mL/min Final    Comment:    (NOTE) Calculated using the CKD-EPI Creatinine Equation (2021)     eGFR  Date Value Ref Range Status  12/24/2023 91 > OR = 60 mL/min/1.43m2 Final         Passed - Patient is not pregnant      Passed - Valid encounter within last 12 months    Recent Outpatient Visits           1 week ago Type 2 diabetes mellitus with hyperglycemia, without long-term current use of insulin  [facility])   [place] [facility] Bernardo Fend, DO   6 months ago Type 2 diabetes mellitus with hyperglycemia, without long-term current use of insulin  [facility])   [facility] Bernardo Fend, DO   9 months ago Type 2 diabetes mellitus with hyperglycemia, without long-term current use of insulin  [facility])   [facility] Bernardo Fend, [place]

## 2024-01-04 ENCOUNTER — Other Ambulatory Visit: Payer: Self-pay | Admitting: Internal Medicine

## 2024-01-04 ENCOUNTER — Telehealth: Payer: Self-pay | Admitting: Internal Medicine

## 2024-01-04 ENCOUNTER — Ambulatory Visit: Payer: Self-pay

## 2024-01-04 DIAGNOSIS — M150 Primary generalized (osteo)arthritis: Secondary | ICD-10-CM

## 2024-01-04 NOTE — Telephone Encounter (Unsigned)
 Copied from CRM #[id number]. Topic: Clinical - Medication Refill >> Jan 04, 2024  9:51 AM Everette C wrote: Medication: meloxicam  (MOBIC ) 15 MG tablet [[id number]] - 90 day supply   Has the patient contacted their pharmacy? Yes (Agent: If no, request that the patient contact the pharmacy for the refill. If patient does not wish to contact the pharmacy document the reason why and proceed with request.) (Agent: If yes, when and what did the pharmacy advise?)  This is the patient's preferred pharmacy:  [facility] - [place], [place] - [address] [place] [address] Phone: [phone] Fax: [phone]  Is this the correct pharmacy for this prescription? Yes If no, delete pharmacy and type the correct one.   Has the prescription been filled recently? Yes  Is the patient out of the medication? Yes  Has the patient been seen for an appointment in the last year OR does the patient have an upcoming appointment? Yes  Can we respond through MyChart? No  Agent: Please be advised that Rx refills may take up to 3 business days. We ask that you follow-up with your pharmacy.

## 2024-01-04 NOTE — Telephone Encounter (Signed)
 Please see triage Encounter for request for chronic medication that is supposed to be taken over by PCP from Podiatry. Meloxicam  90day supply requested- only 3 tablets left
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
## 2024-01-05 NOTE — Telephone Encounter (Signed)
 Requested Prescriptions  Refused Prescriptions Disp Refills   meloxicam  (MOBIC ) 15 MG tablet [Pharmacy Med Name: MELOXICAM  15 MG TAB] 90 tablet 1    Sig: TAKE ONE TABLET BY MOUTH ONCE DAILY     Analgesics:  COX2 Inhibitors Failed - 01/05/2024 11:53 AM      Failed - Manual Review: Labs are only required if the patient has taken medication for more than 8 weeks.      Passed - HGB in normal range and within 360 days    Hemoglobin  Date Value Ref Range Status  12/24/2023 14.6 13.2 - 17.1 g/dL Final   HGB  Date Value Ref Range Status  08/18/2011 14.8 13.0 - 18.0 g/dL Final         Passed - Cr in normal range and within 360 days    Creat  Date Value Ref Range Status  12/24/2023 0.88 0.70 - 1.28 mg/dL Final   Creatinine, Urine  Date Value Ref Range Status  03/23/2023 102 20 - 320 mg/dL Final         Passed - HCT in normal range and within 360 days    HCT  Date Value Ref Range Status  12/24/2023 44.0 38.5 - 50.0 % Final  08/18/2011 43.7 40.0 - 52.0 % Final         Passed - AST in normal range and within 360 days    AST  Date Value Ref Range Status  12/24/2023 19 10 - 35 U/L Final   SGOT(AST)  Date Value Ref Range Status  08/18/2011 17 15 - 37 Unit/L Final         Passed - ALT in normal range and within 360 days    ALT  Date Value Ref Range Status  12/24/2023 23 9 - 46 U/L Final   SGPT (ALT)  Date Value Ref Range Status  08/18/2011 38 U/L Final    Comment:    12-78 NOTE: NEW REFERENCE RANGE 01/03/2011          Passed - eGFR is 30 or above and within 360 days    GFR, Est African American  Date Value Ref Range Status  09/01/2019 104 > OR = 60 mL/min/1.22m2 Final   GFR, Est Non African American  Date Value Ref Range Status  09/01/2019 89 > OR = 60 mL/min/1.58m2 Final   GFR, Estimated  Date Value Ref Range Status  04/20/2023 >60 >60 mL/min Final    Comment:    (NOTE) Calculated using the CKD-EPI Creatinine Equation (2021)    eGFR  Date Value Ref Range  Status  12/24/2023 91 > OR = 60 mL/min/1.57m2 Final         Passed - Patient is not pregnant      Passed - Valid encounter within last 12 months    Recent Outpatient Visits           1 week ago Type 2 diabetes mellitus with hyperglycemia, without long-term current use of insulin  [facility])   [facility] Bernardo Fend, DO   6 months ago Type 2 diabetes mellitus with hyperglycemia, without long-term current use of insulin  [facility])    [facility] Bernardo Fend, DO   9 months ago Type 2 diabetes mellitus with hyperglycemia, without long-term current use of insulin  [facility])   [facility] Bernardo Fend, [place]

## 2024-01-06 ENCOUNTER — Other Ambulatory Visit: Payer: Self-pay | Admitting: Internal Medicine

## 2024-01-06 DIAGNOSIS — M150 Primary generalized (osteo)arthritis: Secondary | ICD-10-CM

## 2024-01-06 MED ORDER — MELOXICAM 15 MG PO TABS: 15.0000 mg | ORAL_TABLET | Freq: Every day | ORAL | 0 refills | Status: AC | PRN

## 2024-01-06 NOTE — Telephone Encounter (Signed)
 Copied from CRM #[id number]. Topic: Clinical - Medication Refill >> Jan 04, 2024  9:51 AM Everette C wrote: Medication: meloxicam  (MOBIC ) 15 MG tablet [[id number]] - 90 day supply   Has the patient contacted their pharmacy? Yes (Agent: If no, request that the patient contact the pharmacy for the refill. If patient does not wish to contact the pharmacy document the reason why and proceed with request.) (Agent: If yes, when and what did the pharmacy advise?)  This is the patient's preferred pharmacy:  [facility] - [place], [place] - [address] [place] [address] Phone: [phone] Fax: [phone]  Is this the correct pharmacy for this prescription? Yes If no, delete pharmacy and type the correct one.   Has the prescription been filled recently? Yes  Is the patient out of the medication? Yes  Has the patient been seen for an appointment in the last year OR does the patient have an upcoming appointment? Yes  Can we respond through MyChart? No  Agent: Please be advised that Rx refills may take up to 3 business days. We ask that you follow-up with your pharmacy. >> Jan 06, 2024  8:26 AM Amy B wrote: 2nd attempt:  Patients states he was told by the pharmacy that the refill request for meloxicam  (MOBIC ) 15 MG tablet had been refused.  He would like to know why and states he has severe arthritis and needs this medication.  Please call [phone]

## 2024-01-14 ENCOUNTER — Telehealth: Payer: Self-pay | Admitting: Internal Medicine

## 2024-01-14 DIAGNOSIS — N5082 Scrotal pain: Secondary | ICD-10-CM

## 2024-01-14 DIAGNOSIS — Z8042 Family history of malignant neoplasm of prostate: Secondary | ICD-10-CM

## 2024-01-14 NOTE — Telephone Encounter (Signed)
 Copied from CRM #[id number]. Topic: Referral - Request for Referral >> Jan 14, 2024  1:43 PM Vincent Guerra wrote: Did the patient discuss referral with their provider in the last year? No (If No - schedule appointment) (If Yes - send message)  Appointment offered? No  Type of order/referral and detailed reason for visit: He explained his family history has prostate cancer. Wants to see urologist  Preference of office, provider, location: Vincent Guerra ( [place] [facility] [place] )  If referral order, have you been seen by this specialty before? No (If Yes, this issue or another issue? When? Where?  Can we respond through MyChart? No

## 2024-01-14 NOTE — Telephone Encounter (Signed)
Patient would like referral for Urology.

## 2024-01-15 NOTE — Telephone Encounter (Signed)
 Fx hx prostate cancer, and has been having some scrotum discomfort, I told him we would refer but if could not see if soon may need appt her to check.

## 2024-02-16 ENCOUNTER — Ambulatory Visit (INDEPENDENT_AMBULATORY_CARE_PROVIDER_SITE_OTHER): Admitting: Urology

## 2024-02-16 VITALS — BP 132/86 | HR 82 | Ht 71.0 in | Wt 219.0 lb

## 2024-02-16 DIAGNOSIS — R102 Pelvic and perineal pain unspecified side: Secondary | ICD-10-CM | POA: Diagnosis not present

## 2024-02-16 DIAGNOSIS — Z125 Encounter for screening for malignant neoplasm of prostate: Secondary | ICD-10-CM | POA: Diagnosis not present

## 2024-02-16 DIAGNOSIS — R399 Unspecified symptoms and signs involving the genitourinary system: Secondary | ICD-10-CM

## 2024-02-16 NOTE — Progress Notes (Signed)
" ° °  02/16/2024 2:32 PM   Vincent Guerra Lot 12-Oct-1950 [id number]  CC: Scrotal pain, PSA screening, BPH  HPI: 74 year old male with chronic back pain, multiple prior back procedures including most recently March 2025 who is referred for the above issues.  He reports some right sided groin and scrotal pain that started about 6 to 7 weeks ago, and resolved within the last week.  The seem to be worse with movement and physical activity.  He takes NSAIDs at baseline.  No alleviating factors.  Denies any significant urinary symptoms, has been on Flomax  and finasteride  long-term.  PSA November 2025 normal at 0.19, corrected for finasteride  0.38.  PMH: Past Medical History:  Diagnosis Date   Allergy    Arthritis    Diabetes mellitus without complication (HCC)    Diverticulosis    GERD (gastroesophageal reflux disease)    Hyperlipidemia     Surgical History: Past Surgical History:  Procedure Laterality Date   ANTERIOR LAT LUMBAR FUSION Left 04/22/2023   Procedure: ANTERIOR LATERAL LUMBAR FUSION 1 LEVEL;  Surgeon: Beuford Anes, MD;  Location: [facility] OR;  Service: Orthopedics;  Laterality: Left;  LEFT-SIDED LUMBAR 3- LUMBAR 4 LATERAL INTERBODY FUSION WITH INSTRUMENTATION AND ALLOGRAFT   BACK SURGERY  01/17/2015   Dr [facility]van [facility]), Multiple diskectomy and herniated disc   CHOLECYSTECTOMY  07/07/2019   TOTAL KNEE ARTHROPLASTY Left 01/17/2019   Procedure: TOTAL KNEE ARTHROPLASTY;  Surgeon: Rubie Kemps, MD;  Location: [facility] ORS;  Service: Orthopedics;  Laterality: Left;  75 mins needed for length of case. same day discharge   VASECTOMY     Family History: Family History  Problem Relation Age of Onset   Heart disease Mother    Alzheimer's disease Mother    Heart attack Mother    Prostate cancer Father 3   Heart disease Brother 38       CABG   Heart disease Brother        CABG    Social History:  reports that he quit smoking about 32 years ago. His smoking use included cigarettes. He  smoked an average of 1 pack per day. He has never used smokeless tobacco. He reports current alcohol use of about 24.0 standard drinks of alcohol per week. He reports that he does not use drugs.  Physical Exam: BP 132/86 (BP Location: Left Arm, Patient Position: Sitting, Cuff Size: Large)   Pulse 82   Ht 5' 11 (1.803 m)   Wt 219 lb (99.3 kg)   SpO2 95%   BMI 30.54 kg/m    Constitutional:  Alert and oriented, No acute distress. Cardiovascular: No clubbing, cyanosis, or edema. Respiratory: Normal respiratory effort, no increased work of breathing. GI: Abdomen is soft, nontender, nondistended, no abdominal masses GU: Testicles 20 cc and descended bilaterally without masses, no tenderness  Laboratory Data: See HPI  Assessment & Plan:   74 year old male with chronic back pain, 6 to 7 weeks of right-sided groin and scrotal pain that has since resolved.  Reassurance provided regarding normal physical exam and normal PSA.  Can continue his chronic BPH medications.  Pelvic floor stretching exercises provided, he does ride a motorcycle which may be contributing.  Could trial amitriptyline or gabapentin  in the future if worsening symptoms.  Follow-up with urology as needed   Redell Burnet, MD 02/16/2024  [facility] [address] [place], [place] [address] [phone]   "

## 2024-02-16 NOTE — Patient Instructions (Signed)
 [name]

## 2024-06-22 ENCOUNTER — Ambulatory Visit: Admitting: Internal Medicine
